# Patient Record
Sex: Female | Born: 1950 | Race: White | Hispanic: No | Smoking: Current every day smoker
Health system: Southern US, Community
[De-identification: ages and names within clinical notes are randomized; demographics above are authoritative.]

## PROBLEM LIST (undated history)

## (undated) DIAGNOSIS — R112 Nausea with vomiting, unspecified: Secondary | ICD-10-CM

## (undated) DIAGNOSIS — J449 Chronic obstructive pulmonary disease, unspecified: Secondary | ICD-10-CM

## (undated) DIAGNOSIS — Z9889 Other specified postprocedural states: Secondary | ICD-10-CM

## (undated) DIAGNOSIS — C50919 Malignant neoplasm of unspecified site of unspecified female breast: Secondary | ICD-10-CM

## (undated) DIAGNOSIS — Z9221 Personal history of antineoplastic chemotherapy: Secondary | ICD-10-CM

## (undated) DIAGNOSIS — Z1371 Encounter for nonprocreative screening for genetic disease carrier status: Secondary | ICD-10-CM

## (undated) DIAGNOSIS — C801 Malignant (primary) neoplasm, unspecified: Secondary | ICD-10-CM

## (undated) DIAGNOSIS — Z923 Personal history of irradiation: Secondary | ICD-10-CM

## (undated) DIAGNOSIS — R918 Other nonspecific abnormal finding of lung field: Secondary | ICD-10-CM

## (undated) DIAGNOSIS — I4510 Unspecified right bundle-branch block: Secondary | ICD-10-CM

## (undated) HISTORY — DX: Malignant (primary) neoplasm, unspecified: C80.1

## (undated) HISTORY — PX: BREAST CYST ASPIRATION: SHX578

## (undated) HISTORY — DX: Encounter for nonprocreative screening for genetic disease carrier status: Z13.71

---

## 1997-07-08 HISTORY — PX: BREAST EXCISIONAL BIOPSY: SUR124

## 2000-07-08 HISTORY — PX: CHOLECYSTECTOMY: SHX55

## 2003-07-09 DIAGNOSIS — C50919 Malignant neoplasm of unspecified site of unspecified female breast: Secondary | ICD-10-CM

## 2003-07-09 HISTORY — PX: BREAST LUMPECTOMY: SHX2

## 2003-07-09 HISTORY — DX: Malignant neoplasm of unspecified site of unspecified female breast: C50.919

## 2003-07-09 HISTORY — PX: BREAST EXCISIONAL BIOPSY: SUR124

## 2004-06-14 ENCOUNTER — Ambulatory Visit: Payer: Self-pay | Admitting: Radiation Oncology

## 2004-07-08 HISTORY — PX: MASTECTOMY: SHX3

## 2004-07-08 HISTORY — PX: BREAST EXCISIONAL BIOPSY: SUR124

## 2004-07-16 ENCOUNTER — Ambulatory Visit: Payer: Self-pay | Admitting: General Surgery

## 2004-07-25 ENCOUNTER — Ambulatory Visit: Payer: Self-pay | Admitting: General Surgery

## 2004-08-13 ENCOUNTER — Ambulatory Visit: Payer: Self-pay | Admitting: Oncology

## 2004-08-29 ENCOUNTER — Ambulatory Visit: Payer: Self-pay | Admitting: General Surgery

## 2004-09-05 ENCOUNTER — Ambulatory Visit: Payer: Self-pay | Admitting: Oncology

## 2004-10-06 ENCOUNTER — Ambulatory Visit: Payer: Self-pay | Admitting: Oncology

## 2004-10-31 ENCOUNTER — Emergency Department: Payer: Self-pay | Admitting: Emergency Medicine

## 2004-11-05 ENCOUNTER — Ambulatory Visit: Payer: Self-pay | Admitting: Oncology

## 2004-12-06 ENCOUNTER — Ambulatory Visit: Payer: Self-pay | Admitting: Oncology

## 2005-01-05 ENCOUNTER — Ambulatory Visit: Payer: Self-pay | Admitting: Oncology

## 2005-02-05 ENCOUNTER — Ambulatory Visit: Payer: Self-pay | Admitting: Oncology

## 2005-03-08 ENCOUNTER — Ambulatory Visit: Payer: Self-pay | Admitting: Oncology

## 2005-05-13 ENCOUNTER — Ambulatory Visit: Payer: Self-pay | Admitting: Oncology

## 2005-06-07 ENCOUNTER — Ambulatory Visit: Payer: Self-pay | Admitting: Oncology

## 2005-07-24 ENCOUNTER — Ambulatory Visit: Payer: Self-pay | Admitting: Radiation Oncology

## 2005-08-08 ENCOUNTER — Ambulatory Visit: Payer: Self-pay | Admitting: Radiation Oncology

## 2005-08-30 ENCOUNTER — Ambulatory Visit: Payer: Self-pay | Admitting: Oncology

## 2005-09-05 ENCOUNTER — Ambulatory Visit: Payer: Self-pay | Admitting: Radiation Oncology

## 2005-12-02 ENCOUNTER — Ambulatory Visit: Payer: Self-pay | Admitting: Oncology

## 2005-12-06 ENCOUNTER — Ambulatory Visit: Payer: Self-pay | Admitting: Oncology

## 2006-01-22 ENCOUNTER — Ambulatory Visit: Payer: Self-pay | Admitting: Oncology

## 2006-03-03 ENCOUNTER — Ambulatory Visit: Payer: Self-pay | Admitting: Oncology

## 2006-03-08 ENCOUNTER — Ambulatory Visit: Payer: Self-pay | Admitting: Oncology

## 2006-04-07 ENCOUNTER — Ambulatory Visit: Payer: Self-pay | Admitting: Oncology

## 2006-07-14 ENCOUNTER — Ambulatory Visit: Payer: Self-pay | Admitting: Oncology

## 2006-07-28 IMAGING — CT CT CHEST-ABD-PELV W/ CM
1 of 6 series · 13 of 32 positions shown, 18 images · non-contrast
Comparison: none

REASON FOR EXAM: breast CA, evaluate for mets
COMMENTS:

[Series 3: inspace · axial · 0.62mm/px · z∈[-533,-31]mm · 13 of 574 slices shown, 18 images]
[im 36/574  mediastinal]
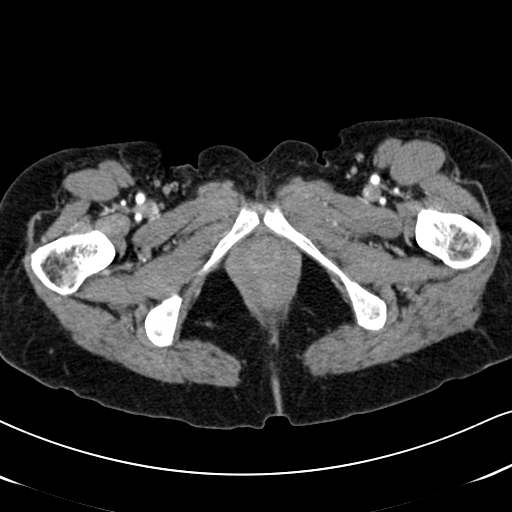
[im 36/574  bone]
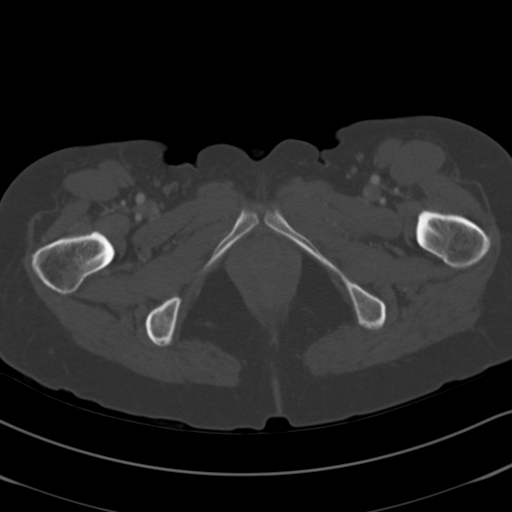
[im 108/574  mediastinal]
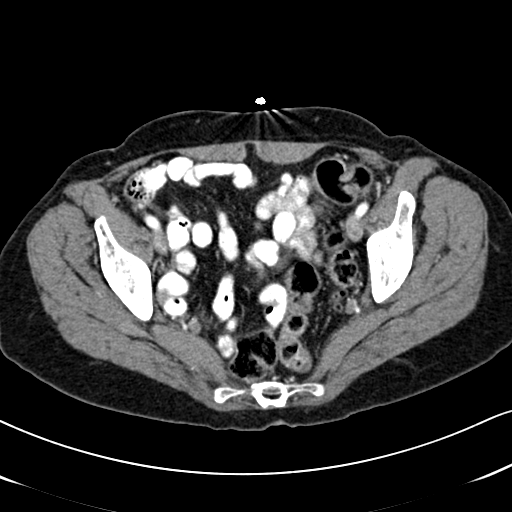
[im 144/574  mediastinal]
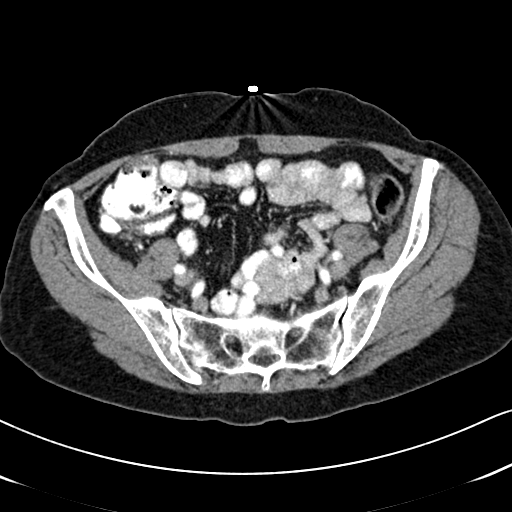
[im 180/574  mediastinal]
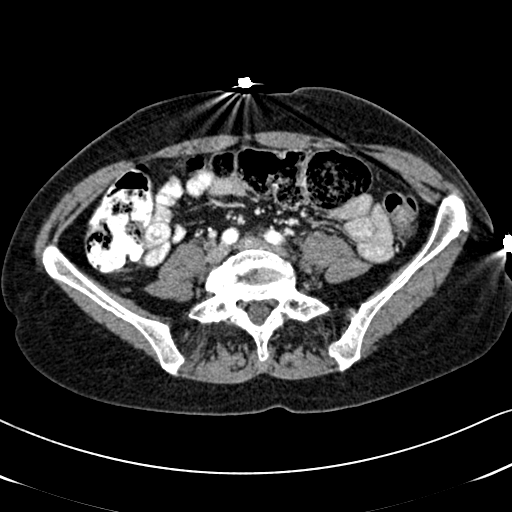
[im 251/574  mediastinal]
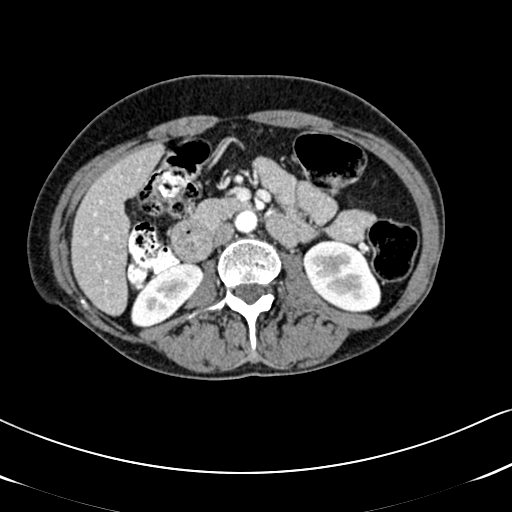
[im 278/574  mediastinal]
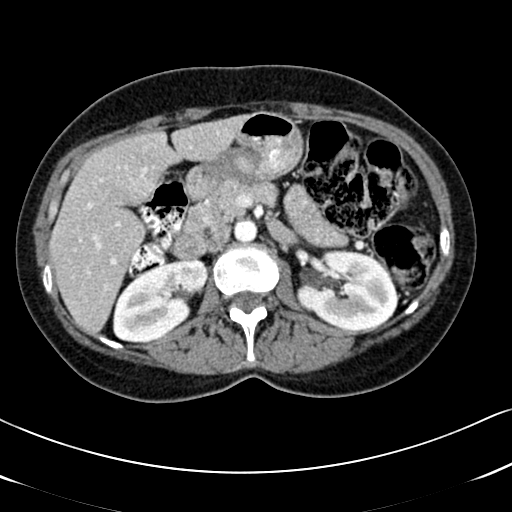
[im 287/574  mediastinal]
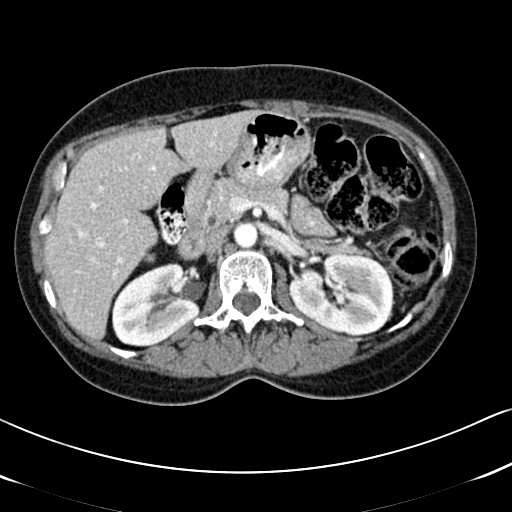
[im 359/574  mediastinal]
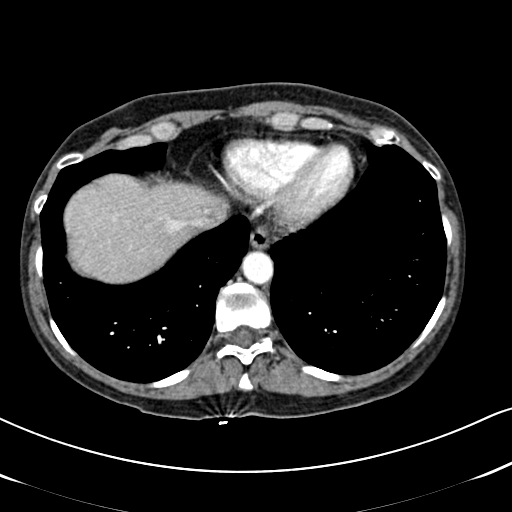
[im 394/574  mediastinal]
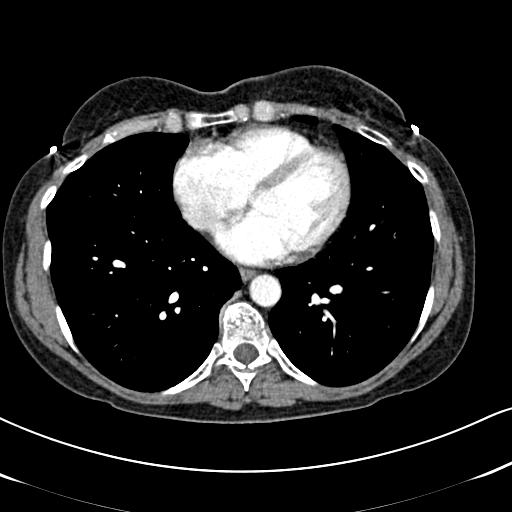
[im 394/574  bone]
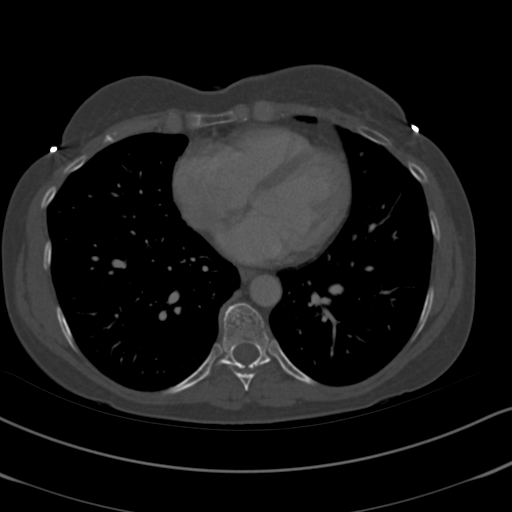
[im 430/574  mediastinal]
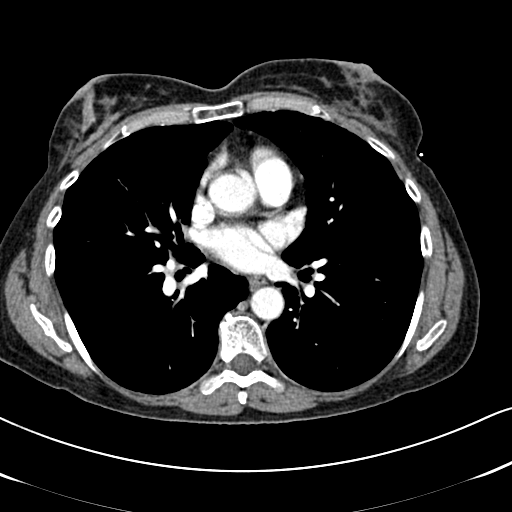
[im 430/574  lung]
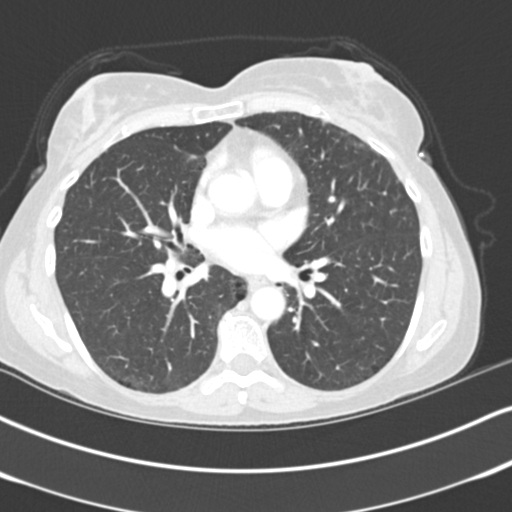
[im 466/574  lung]
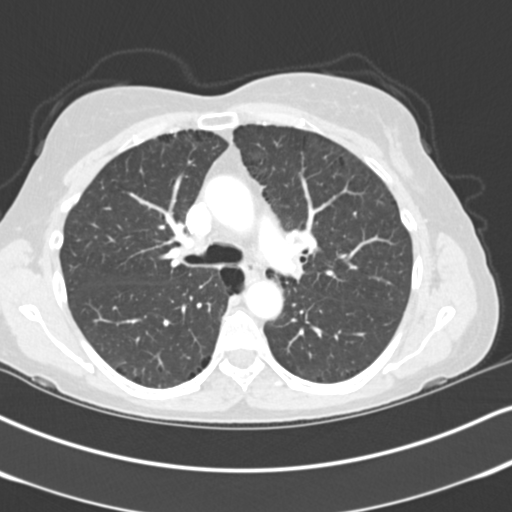
[im 502/574  mediastinal]
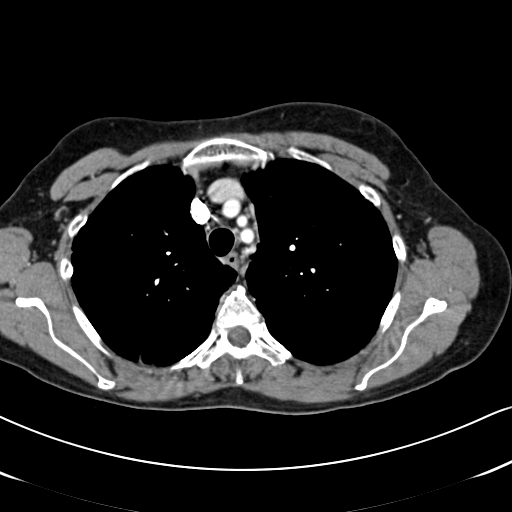
[im 502/574  lung]
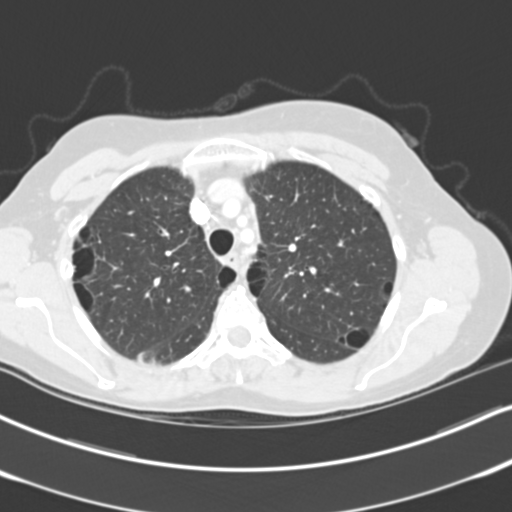
[im 538/574  mediastinal]
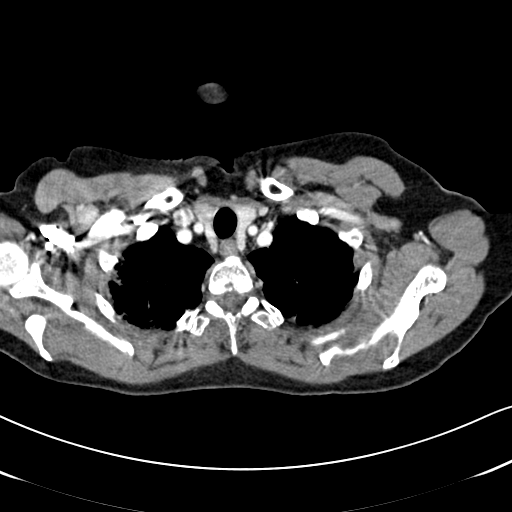
[im 538/574  lung]
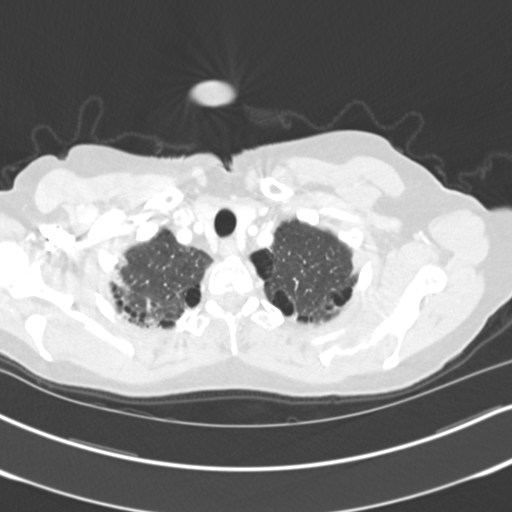

[13 of 32 positions shown; findings below may reference images not displayed]

PROCEDURE:     CT  - CT CHEST ABDOMEN AND PELVIS W  - August 14, 2004 [DATE]

RESULT:     IV contrast enhanced CT scan of the chest is performed.

CT SCAN OF CHEST:  There are observed areas of density at the lung apices
suggestive of fibrosis. There is associated small bulla in both upper lung
zone regions. No definite pulmonary parenchymal mass is appreciated. Minimal
areas of linear density suggest fibrosis or subsegmental atelectasis. There
is no effusion. There is no evidence of axillary, mediastinal or hilar mass
or adenopathy. The heart is not enlarged.
CONCLUSION: Apical bullous emphysematous changes with areas of fibrosis in
the apices. No definite pulmonary parenchymal metastatic disease or evidence
of mediastinal metastatic involvement. Note is made that there is some
slightly asymmetric soft tissue density on the RIGHT which may represent
post-operative change.

CT SCAN OF ABDOMEN AND PELVIS:  IV and oral contrast enhanced CT scan of the
abdomen and pelvis is performed.

The liver, spleen, pancreas and adrenal glands appear to be unremarkable.
There is a small, low density area in the RIGHT kidney suggestive of a tiny
cyst. This does not enhance on the delayed images. This is in the medial
posterior aspect of the lower portion of the RIGHT kidney and measures less
than 1.0 cm in size. The abdominal aorta is normal in caliber.
Cholecystectomy clips are present. No definite retroperitoneal or mesenteric
adenopathy is seen. The bowel is intermittently opacified. No free fluid or
abnormal fluid collections are seen. The uterus is present. No inguinal
adenopathy is appreciated.
IMPRESSION: 1.     Incidental finding of a RIGHT renal cyst.
2.     Status post cholecystectomy.
3.     No definite evidence of metastatic disease. Correlation with Bone
Scan may be helpful to evaluate for occult bony metastatic disease.

## 2006-08-08 ENCOUNTER — Ambulatory Visit: Payer: Self-pay | Admitting: Oncology

## 2006-08-11 ENCOUNTER — Ambulatory Visit: Payer: Self-pay | Admitting: General Surgery

## 2006-11-06 ENCOUNTER — Ambulatory Visit: Payer: Self-pay | Admitting: Oncology

## 2006-11-10 ENCOUNTER — Ambulatory Visit: Payer: Self-pay | Admitting: Oncology

## 2006-12-07 ENCOUNTER — Ambulatory Visit: Payer: Self-pay | Admitting: Oncology

## 2007-03-16 ENCOUNTER — Ambulatory Visit: Payer: Self-pay | Admitting: Oncology

## 2007-04-08 ENCOUNTER — Ambulatory Visit: Payer: Self-pay | Admitting: Oncology

## 2007-07-09 HISTORY — PX: COLONOSCOPY: SHX174

## 2007-08-09 ENCOUNTER — Ambulatory Visit: Payer: Self-pay | Admitting: Oncology

## 2007-08-10 ENCOUNTER — Ambulatory Visit: Payer: Self-pay | Admitting: General Surgery

## 2007-09-06 ENCOUNTER — Ambulatory Visit: Payer: Self-pay | Admitting: Oncology

## 2007-10-08 ENCOUNTER — Ambulatory Visit: Payer: Self-pay | Admitting: Oncology

## 2007-11-06 ENCOUNTER — Ambulatory Visit: Payer: Self-pay | Admitting: Oncology

## 2008-08-10 ENCOUNTER — Ambulatory Visit: Payer: Self-pay | Admitting: General Surgery

## 2009-10-10 ENCOUNTER — Ambulatory Visit: Payer: Self-pay

## 2010-10-16 ENCOUNTER — Ambulatory Visit: Payer: Self-pay

## 2011-07-09 DIAGNOSIS — Z1371 Encounter for nonprocreative screening for genetic disease carrier status: Secondary | ICD-10-CM

## 2011-07-09 HISTORY — DX: Encounter for nonprocreative screening for genetic disease carrier status: Z13.71

## 2011-10-16 ENCOUNTER — Ambulatory Visit: Payer: Self-pay

## 2012-08-28 ENCOUNTER — Encounter: Payer: Self-pay | Admitting: *Deleted

## 2012-08-28 DIAGNOSIS — C801 Malignant (primary) neoplasm, unspecified: Secondary | ICD-10-CM | POA: Insufficient documentation

## 2012-10-21 ENCOUNTER — Ambulatory Visit: Payer: Self-pay

## 2012-11-18 ENCOUNTER — Ambulatory Visit: Payer: Self-pay | Admitting: General Surgery

## 2012-12-03 ENCOUNTER — Ambulatory Visit: Payer: Self-pay | Admitting: General Surgery

## 2012-12-07 ENCOUNTER — Encounter: Payer: Self-pay | Admitting: General Surgery

## 2012-12-22 ENCOUNTER — Encounter: Payer: Self-pay | Admitting: General Surgery

## 2012-12-22 ENCOUNTER — Ambulatory Visit (INDEPENDENT_AMBULATORY_CARE_PROVIDER_SITE_OTHER): Payer: PRIVATE HEALTH INSURANCE | Admitting: General Surgery

## 2012-12-22 VITALS — BP 110/62 | HR 72 | Resp 12 | Ht 61.0 in | Wt 94.0 lb

## 2012-12-22 DIAGNOSIS — Z853 Personal history of malignant neoplasm of breast: Secondary | ICD-10-CM

## 2012-12-22 DIAGNOSIS — C801 Malignant (primary) neoplasm, unspecified: Secondary | ICD-10-CM

## 2012-12-22 NOTE — Patient Instructions (Addendum)
Patient to return in one year. 

## 2012-12-22 NOTE — Progress Notes (Signed)
Patient ID: Gabriella Green, female   DOB: 07/04/1951, 62 y.o.   MRN: [id number]  Chief Complaint  Patient presents with  . Follow-up    mammogram    HPI Gabriella Green is a 62 y.o. female hx of bil breast ca, s/p right mastectomy and left breast lumpectomy. Overall doing well. Her younger sister was diagnosed with breast cancer and a mother .Pt has not been tested for BRCA. She does have daughters and a granddaughter. Patient had her mammogram on 12/03/12 cat 2.    HPI  Past Medical History  Diagnosis Date  . Cancer 1478,2956    right mastectomy and left breast lumpectomy    Past Surgical History  Procedure Laterality Date  . Mastectomy Right 2006  . Breast lumpectomy Left 2005    1999 right lumpectomy  . Cholecystectomy  2002  . Colonoscopy  2009    Family History  Problem Relation Age of Onset  . Breast cancer Mother   . Breast cancer Sister     Social History History  Substance Use Topics  . Smoking status: Current Every Day Smoker -- 1.00 packs/day for 20 years    Types: Cigarettes  . Smokeless tobacco: Not on file  . Alcohol Use: No    Allergies  Allergen Reactions  . Codeine Nausea And Vomiting  . Tape Swelling    tegaderm     No current outpatient prescriptions on file.   No current facility-administered medications for this visit.    Review of Systems Review of Systems  Constitutional: Negative.   Respiratory: Negative.   Cardiovascular: Negative.     Blood pressure 110/62, pulse 72, resp. rate 12, height 5\' 1"  (1.549 m), weight 94 lb (42.638 kg).  Physical Exam Physical Exam  Constitutional: She appears well-developed and well-nourished.  Eyes: Conjunctivae are normal. No scleral icterus.  Neck: Neck supple. No thyromegaly present.  Cardiovascular: Normal rate, regular rhythm and normal heart sounds.   Pulmonary/Chest: Breath sounds normal.  Right mastectomy well healed. No sign of local recurrence. Left lumpectomy site well healed.   Lymphadenopathy:    She has no cervical adenopathy.    She has no axillary adenopathy.  Neurological: She is alert.  Skin: Skin is warm and dry.    Data Reviewed mammogram reviewed  Assessment    Stable exam    Plan    Return in one year.        SANKAR,SEEPLAPUTHUR G 12/22/2012, 6:55 PM

## 2014-02-02 ENCOUNTER — Ambulatory Visit: Payer: Self-pay

## 2014-03-07 ENCOUNTER — Ambulatory Visit (INDEPENDENT_AMBULATORY_CARE_PROVIDER_SITE_OTHER): Payer: PRIVATE HEALTH INSURANCE | Admitting: General Surgery

## 2014-03-07 ENCOUNTER — Encounter: Payer: Self-pay | Admitting: General Surgery

## 2014-03-07 VITALS — BP 124/72 | HR 70 | Resp 12 | Ht 62.0 in | Wt 99.0 lb

## 2014-03-07 DIAGNOSIS — Z853 Personal history of malignant neoplasm of breast: Secondary | ICD-10-CM

## 2014-03-07 NOTE — Progress Notes (Signed)
Patient ID: Gabriella Green, female   DOB: 10/31/1950, 63 y.o.   MRN: [id number]  Chief Complaint  Patient presents with  . Follow-up    mammogram    HPI Gabriella Green is a 63 y.o. female who presents for a breast cancer follow up  The most recent left breast  mammogram was done on  01/13/14. Patient had her generic testing done 2013-negative Patient does perform regular self breast checks and gets regular mammograms done.    HPI  Past Medical History  Diagnosis Date  . Cancer 7425,9563    right mastectomy and left breast lumpectomy    Past Surgical History  Procedure Laterality Date  . Mastectomy Right 2006  . Breast lumpectomy Left 2005    1999 right lumpectomy  . Cholecystectomy  2002  . Colonoscopy  2009    Family History  Problem Relation Age of Onset  . Breast cancer Mother   . Breast cancer Sister     Social History History  Substance Use Topics  . Smoking status: Current Every Day Smoker -- 1.00 packs/day for 20 years    Types: Cigarettes  . Smokeless tobacco: Not on file  . Alcohol Use: No    Allergies  Allergen Reactions  . Codeine Nausea And Vomiting  . Tape Swelling    tegaderm     No current outpatient prescriptions on file.   No current facility-administered medications for this visit.    Review of Systems Review of Systems  Blood pressure 124/72, pulse 70, resp. rate 12, height 5\' 2"  (1.575 m), weight 99 lb (44.906 kg).  Physical Exam Physical Exam  Constitutional: She is oriented to person, place, and time. She appears well-developed and well-nourished.  Eyes: Conjunctivae are normal. No scleral icterus.  Neck: Neck supple. No mass and no thyromegaly present.  Cardiovascular: Normal rate, regular rhythm and normal heart sounds.   Pulses:      Dorsalis pedis pulses are 2+ on the right side, and 2+ on the left side.       Posterior tibial pulses are 2+ on the right side, and 2+ on the left side.  No edema  Pulmonary/Chest: Breath sounds  normal. Left breast exhibits no inverted nipple, no mass, no nipple discharge, no skin change and no tenderness.  Right mastectomy site is well healed no sign of local recurrent.   Abdominal: Soft. Bowel sounds are normal. There is no tenderness.  Lymphadenopathy:    She has no cervical adenopathy.    She has no axillary adenopathy.  Neurological: She is alert and oriented to person, place, and time.  Skin: Skin is warm and dry.    Data Reviewed Mammogram left reviewed and stable  Assessment    Stable exam. History of bilateral Breast Ca, s/p right mastectomy, left lumpectomy and radiation.      Plan    Patient will be asked to return to the office in one year with a left breast  mammogram.        Junie Panning G 03/07/2014, 12:55 PM

## 2014-03-07 NOTE — Patient Instructions (Addendum)
Patient will be asked to return to the office in one year for a left breast diagnotic mammogram. Continue self breast exams. Call office for any new breast issues or concerns.

## 2014-05-09 ENCOUNTER — Encounter: Payer: Self-pay | Admitting: General Surgery

## 2014-07-31 ENCOUNTER — Emergency Department: Payer: Self-pay | Admitting: Emergency Medicine

## 2015-01-19 ENCOUNTER — Encounter: Payer: Self-pay | Admitting: *Deleted

## 2015-02-08 ENCOUNTER — Encounter: Payer: Self-pay | Admitting: *Deleted

## 2015-02-08 ENCOUNTER — Ambulatory Visit: Payer: Self-pay | Attending: Oncology | Admitting: *Deleted

## 2015-02-08 ENCOUNTER — Ambulatory Visit
Admission: RE | Admit: 2015-02-08 | Discharge: 2015-02-08 | Disposition: A | Discharge status: Home / Self Care | Payer: Self-pay | Source: Ambulatory Visit | Attending: Oncology | Admitting: Oncology

## 2015-02-08 VITALS — BP 143/73 | HR 79 | Temp 97.9°F | Ht 61.81 in | Wt 95.9 lb

## 2015-02-08 DIAGNOSIS — N63 Unspecified lump in unspecified breast: Secondary | ICD-10-CM

## 2015-02-08 NOTE — Progress Notes (Signed)
Subjective:     Patient ID: Gabriella Green, female   DOB: Nov 29, 1950, 64 y.o.   MRN: [id number]  HPI   Review of Systems     Objective:   Physical Exam  Pulmonary/Chest: Left breast exhibits mass and skin change. Left breast exhibits no inverted nipple, no nipple discharge and no tenderness. Breasts are asymmetrical.         Assessment:    64 year old White female returns to [facility] for annual screening.  Patient with 3 time history of breast cancer.  Twice in the right breast with mastectomy and once in the left breast.  Last diagnosis was in 2006.  On clinical breast exam the patient pointed out an area of concern in the left axillary tail.  I can palpate a smooth, mobile, superficial approximate 4 mm nodule at 1:00 4 cm from the areola.  Taught self breast awareness.  Patient has been screened for eligibility.  She does not have any insurance, Medicare or Medicaid.  She also meets financial eligibility.  Hand-out given on the Affordable Care Act.    Plan:     Will get uni left mammogram and ultrasound.  Patient is already scheduled to see Dr. Jamal Collin on 02/15/15.  Encouraged her to keep the appointment even if the mammogram is normal.  She is agreeable.  Will follow-up per protocol.

## 2015-02-15 ENCOUNTER — Encounter: Payer: Self-pay | Admitting: General Surgery

## 2015-02-15 ENCOUNTER — Ambulatory Visit (INDEPENDENT_AMBULATORY_CARE_PROVIDER_SITE_OTHER): Payer: PRIVATE HEALTH INSURANCE | Admitting: General Surgery

## 2015-02-15 VITALS — BP 122/66 | HR 82 | Resp 12 | Ht 61.0 in | Wt 96.0 lb

## 2015-02-15 DIAGNOSIS — Z853 Personal history of malignant neoplasm of breast: Secondary | ICD-10-CM | POA: Diagnosis not present

## 2015-02-15 NOTE — Progress Notes (Signed)
Patient ID: Gabriella Green, female   DOB: 07/29/1950, 64 y.o.   MRN: [id number]  Chief Complaint  Patient presents with  . Follow-up    mammogram    HPI Gabriella Green is a 64 y.o. female.  who presents for a breast cancer follow up. The most recent left breastmammogram and ultrasound was done on8-3-16. Patient had her generic testing done 2013-negative. Patient does not perform regular self breast checks and gets regular mammograms done. She can feel a lump in the left breast that she has noticied for about 3 weeks. Denies any injury or trauma.  HPI  Past Medical History  Diagnosis Date  . Cancer 3151,7616    right mastectomy and left breast lumpectomy  . BRCA negative 2013    Past Surgical History  Procedure Laterality Date  . Mastectomy Right 2006  . Breast lumpectomy Left 2005    1999 right lumpectomy  . Cholecystectomy  2002  . Colonoscopy  2009  . Breast excisional biopsy Right 1999    rad breast ca  . Breast excisional biopsy Left 2005    rad breast ca  . Breast excisional biopsy Right 2006    mastectomy chemo   . Breast cyst aspiration Bilateral     Family History  Problem Relation Age of Onset  . Breast cancer Mother 4  . Breast cancer Sister 31  . Breast cancer Maternal Aunt 61    Social History Social History  Substance Use Topics  . Smoking status: Current Every Day Smoker -- 1.00 packs/day for 20 years    Types: Cigarettes  . Smokeless tobacco: None  . Alcohol Use: No    Allergies  Allergen Reactions  . Codeine Nausea And Vomiting  . Tape Swelling    tegaderm     No current outpatient prescriptions on file.   No current facility-administered medications for this visit.    Review of Systems Review of Systems  Constitutional: Negative.   Respiratory: Negative.   Cardiovascular: Negative.     Blood pressure 122/66, pulse 82, resp. rate 12, height '5\' 1"'  (1.549 m), weight 96 lb (43.545 kg).  Physical Exam Physical Exam   Constitutional: She is oriented to person, place, and time. She appears well-developed and well-nourished.  HENT:  Mouth/Throat: Oropharynx is clear and moist.  Eyes: Conjunctivae are normal. No scleral icterus.  Neck: Neck supple.  Cardiovascular: Normal rate, regular rhythm and normal heart sounds.   Pulmonary/Chest: Effort normal and breath sounds normal. Left breast exhibits no inverted nipple, no mass, no nipple discharge, no skin change and no tenderness.  Right mastectomy well healed, no sign of local reoccurrence.   Abdominal: Soft. There is no hepatomegaly. There is no tenderness.  Lymphadenopathy:    She has no cervical adenopathy.  Neurological: She is alert and oriented to person, place, and time.  Skin: Skin is warm and dry.  Psychiatric: Her behavior is normal.    Data Reviewed Mammogram and ultrasound reviewed. Normal  Assessment    Stable physical exam.    Plan    The patient has been asked to return to the office in one year with a unilateral left breast diagnostic mammogram.      PCP:  No Pcp Per Patient  Christene Lye 02/15/2015, 3:46 PM

## 2015-02-15 NOTE — Patient Instructions (Addendum)
The patient is aware to call back for any questions or concerns.  Continue self breast exams. Call office for any new breast issues or concerns. The patient has been asked to return to the office in one year with a unilateral left breast diagnostic mammogram.

## 2015-02-17 ENCOUNTER — Encounter: Payer: Self-pay | Admitting: *Deleted

## 2015-02-17 NOTE — Progress Notes (Signed)
Patient with benign findings confirmed by Dr. Jamal Collin.  To follow-up in one year.  HSIS to [place].

## 2015-12-06 ENCOUNTER — Other Ambulatory Visit: Payer: Self-pay

## 2015-12-06 DIAGNOSIS — Z1231 Encounter for screening mammogram for malignant neoplasm of breast: Secondary | ICD-10-CM

## 2016-02-12 ENCOUNTER — Other Ambulatory Visit: Payer: Self-pay | Admitting: General Surgery

## 2016-02-12 ENCOUNTER — Ambulatory Visit
Admission: RE | Admit: 2016-02-12 | Discharge: 2016-02-12 | Disposition: A | Discharge status: Home / Self Care | Payer: Medicare Other | Source: Ambulatory Visit | Attending: General Surgery | Admitting: General Surgery

## 2016-02-12 DIAGNOSIS — Z1231 Encounter for screening mammogram for malignant neoplasm of breast: Secondary | ICD-10-CM | POA: Diagnosis not present

## 2016-02-12 HISTORY — DX: Malignant neoplasm of unspecified site of unspecified female breast: C50.919

## 2016-02-15 ENCOUNTER — Encounter: Payer: Self-pay | Admitting: *Deleted

## 2016-02-21 ENCOUNTER — Ambulatory Visit (INDEPENDENT_AMBULATORY_CARE_PROVIDER_SITE_OTHER): Payer: Medicare Other | Admitting: General Surgery

## 2016-02-21 ENCOUNTER — Encounter: Payer: Self-pay | Admitting: General Surgery

## 2016-02-21 VITALS — BP 140/78 | HR 72 | Resp 12 | Ht 61.0 in | Wt 92.0 lb

## 2016-02-21 DIAGNOSIS — Z853 Personal history of malignant neoplasm of breast: Secondary | ICD-10-CM

## 2016-02-21 NOTE — Progress Notes (Signed)
Patient ID: Gabriella Green, female   DOB: [birth year], 65 y.o.   MRN: [id number]  Chief Complaint  Patient presents with  . Follow-up    Mammogram    HPI Gabriella Green is a 65 y.o. female who presents for a follow up on breast cancer x 2, s/p right mastectomy and left lumpectomy + radiation. The most recent mammogram was done on 02/12/16. Patient states that she has two knots - one under the left axilla, the other in the RLQ.  Patient does perform regular self breast checks and gets regular mammograms done.   I have reviewed the history of present illness with the patient.  HPI  Past Medical History:  Diagnosis Date  . BRCA negative 2013  . Breast cancer ([place]) 2005   left breast ca  . Breast cancer ([place]) 2006, 1999   right breast ca  . Cancer [facility]) [id number]   right mastectomy and left breast lumpectomy    Past Surgical History:  Procedure Laterality Date  . BREAST CYST ASPIRATION Bilateral   . BREAST EXCISIONAL BIOPSY Right 1999   rad breast ca  . BREAST EXCISIONAL BIOPSY Left 2005   rad breast ca  . BREAST EXCISIONAL BIOPSY Right 2006   mastectomy chemo   . BREAST LUMPECTOMY Left 2005   1999 right lumpectomy  . CHOLECYSTECTOMY  2002  . COLONOSCOPY  2009  . MASTECTOMY Right 2006    Family History  Problem Relation Age of Onset  . Breast cancer Mother 16  . Breast cancer Sister 60  . Breast cancer Maternal Aunt 58    Social History Social History  Substance Use Topics  . Smoking status: Current Every Day Smoker    Packs/day: 1.00    Years: 20.00    Types: Cigarettes  . Smokeless tobacco: Not on file  . Alcohol use No    Allergies  Allergen Reactions  . Codeine Nausea And Vomiting  . Tape Swelling    tegaderm     No current outpatient prescriptions on file.   No current facility-administered medications for this visit.     Review of Systems Review of Systems  Constitutional: Negative.   Respiratory: Negative.   Cardiovascular: Negative.      Blood pressure 140/78, pulse 72, resp. rate 12, height '5\' 1"'  (1.549 m), weight 92 lb (41.7 kg).  Physical Exam Physical Exam  Constitutional: She is oriented to person, place, and time. She appears well-developed and well-nourished.  HENT:  Mouth/Throat: Oropharynx is clear and moist.  Eyes: Conjunctivae are normal. No scleral icterus.  Neck: Neck supple.  Cardiovascular: Normal rate, regular rhythm and normal heart sounds.   Pulmonary/Chest: Effort normal and breath sounds normal. Left breast exhibits no inverted nipple, no mass, no nipple discharge, no skin change and no tenderness.    Abdominal: Soft. Normal appearance. No hernia.    Lymphadenopathy:    She has no cervical adenopathy.    She has no axillary adenopathy.  Neurological: She is alert and oriented to person, place, and time.  Skin: Skin is warm and dry.  Psychiatric: Her behavior is normal.    Data Reviewed Previous notes, Mammogram reviewed  - stable  Assessment    RLQ mass - possible lipoma. 0.5cm smooth, non-tender, mobile subcutaneous.  Personal and family history of breast cancer  Plan    Follow up two months to check on RLQ mass.     The patient has been asked to return to the office in one year with  a unilateral left breast diagnostic mammogram.      This information has been scribed by Gaspar Cola CMA.     Kavian Peters G 02/22/2016, 11:51 AM

## 2016-02-21 NOTE — Patient Instructions (Addendum)
The patient has been asked to return to the office in one year with a unilateral left breast diagnostic mammogram.     Follow up two months for RLQ mass

## 2016-04-23 ENCOUNTER — Encounter: Payer: Self-pay | Admitting: General Surgery

## 2016-04-23 ENCOUNTER — Ambulatory Visit (INDEPENDENT_AMBULATORY_CARE_PROVIDER_SITE_OTHER): Payer: Medicare Other | Admitting: General Surgery

## 2016-04-23 VITALS — BP 122/64 | HR 66 | Resp 12 | Ht 60.0 in | Wt 93.0 lb

## 2016-04-23 DIAGNOSIS — R222 Localized swelling, mass and lump, trunk: Secondary | ICD-10-CM

## 2016-04-23 DIAGNOSIS — Z853 Personal history of malignant neoplasm of breast: Secondary | ICD-10-CM | POA: Diagnosis not present

## 2016-04-23 NOTE — Progress Notes (Signed)
Patient ID: Gabriella Green, female   DOB: 01-Aug-1950, 65 y.o.   MRN: [id number]  Chief Complaint  Patient presents with  . Follow-up    abdominal wall mass    HPI Gabriella Green is a 65 y.o. female is here today for a 2 month abdominal wall mass follow up. She states the mass has stayed the same, doing well. No pain with the lump. I have reviewed the history of present illness with the patient.  HPI  Past Medical History:  Diagnosis Date  . BRCA negative 2013  . Breast cancer ([place]) 2005   left breast ca  . Breast cancer ([place]) 2006, 1999   right breast ca  . Cancer [facility]) [id number]   right mastectomy and left breast lumpectomy    Past Surgical History:  Procedure Laterality Date  . BREAST CYST ASPIRATION Bilateral   . BREAST EXCISIONAL BIOPSY Right 1999   rad breast ca  . BREAST EXCISIONAL BIOPSY Left 2005   rad breast ca  . BREAST EXCISIONAL BIOPSY Right 2006   mastectomy chemo   . BREAST LUMPECTOMY Left 2005   1999 right lumpectomy  . CHOLECYSTECTOMY  2002  . COLONOSCOPY  2009  . MASTECTOMY Right 2006    Family History  Problem Relation Age of Onset  . Breast cancer Mother 10  . Breast cancer Sister 49  . Breast cancer Maternal Aunt 10    Social History Social History  Substance Use Topics  . Smoking status: Current Every Day Smoker    Packs/day: 1.00    Years: 20.00    Types: Cigarettes  . Smokeless tobacco: Not on file  . Alcohol use No    Allergies  Allergen Reactions  . Codeine Nausea And Vomiting  . Tape Swelling    tegaderm     No current outpatient prescriptions on file.   No current facility-administered medications for this visit.     Review of Systems Review of Systems  Constitutional: Negative.   Respiratory: Negative.   Cardiovascular: Negative.     Blood pressure 122/64, pulse 66, resp. rate 12, height 5' (1.524 m), weight 93 lb (42.2 kg).  Physical Exam Physical Exam  Constitutional: She is oriented to person, place, and  time. She appears well-developed and well-nourished.  Eyes: Conjunctivae are normal. No scleral icterus.  Cardiovascular: Normal rate, regular rhythm and normal heart sounds.   Abdominal: Soft. Bowel sounds are normal. There is no tenderness.    Right  Neurological: She is alert and oriented to person, place, and time.  Skin: Skin is warm and dry.    Data Reviewed Prior note  Assessment    Likely a benign 9m subcutaneous mass abdominal wall- possibly a lipoma.    Plan    No need for intervention. Advised pt to call if it enlarges or causes any pain   Follow up as scheduled next yr Patient aware to call the office if the mass increases in size.     SANKAR,SEEPLAPUTHUR G 04/23/2016, 1:46 PM

## 2016-04-23 NOTE — Patient Instructions (Signed)
Patient aware to call the office if the mass increases in size.

## 2016-12-18 ENCOUNTER — Other Ambulatory Visit: Payer: Self-pay

## 2016-12-18 DIAGNOSIS — Z1231 Encounter for screening mammogram for malignant neoplasm of breast: Secondary | ICD-10-CM

## 2016-12-18 DIAGNOSIS — Z853 Personal history of malignant neoplasm of breast: Secondary | ICD-10-CM

## 2016-12-23 ENCOUNTER — Other Ambulatory Visit: Payer: Self-pay

## 2016-12-23 DIAGNOSIS — Z1231 Encounter for screening mammogram for malignant neoplasm of breast: Secondary | ICD-10-CM

## 2017-02-12 ENCOUNTER — Ambulatory Visit
Admission: RE | Admit: 2017-02-12 | Discharge: 2017-02-12 | Disposition: A | Discharge status: Home / Self Care | Payer: Medicare Other | Source: Ambulatory Visit | Attending: General Surgery | Admitting: General Surgery

## 2017-02-12 DIAGNOSIS — Z1231 Encounter for screening mammogram for malignant neoplasm of breast: Secondary | ICD-10-CM | POA: Insufficient documentation

## 2017-02-19 ENCOUNTER — Ambulatory Visit: Payer: Medicare Other | Admitting: General Surgery

## 2017-02-20 ENCOUNTER — Encounter: Payer: Self-pay | Admitting: General Surgery

## 2017-02-20 ENCOUNTER — Ambulatory Visit (INDEPENDENT_AMBULATORY_CARE_PROVIDER_SITE_OTHER): Payer: Medicare Other | Admitting: General Surgery

## 2017-02-20 VITALS — BP 130/72 | HR 68 | Resp 12 | Ht 61.0 in | Wt 98.0 lb

## 2017-02-20 DIAGNOSIS — Z853 Personal history of malignant neoplasm of breast: Secondary | ICD-10-CM

## 2017-02-20 NOTE — Patient Instructions (Signed)
Follow up in one year with left screening mammogram and office visit with Dr Bary Castilla  Continue self breast exams. Call office for any new breast issues or concerns.

## 2017-02-20 NOTE — Progress Notes (Signed)
Patient ID: Gabriella Green, female   DOB: [birth year], 66 y.o.   MRN: [id number]  Chief Complaint  Patient presents with  . Follow-up    mammogram    HPI Gabriella Green is a 66 y.o. female with a history of bilateral breast cancer who presents for a breast evaluation. The most recent mammogram was done on 02/12/17. Patient does perform regular self breast checks and gets regular mammograms done.   She had some recent trouble with her stomach with nausea and weight loss. This was treated and seems to have resolved.    HPI  Past Medical History:  Diagnosis Date  . BRCA negative 2013  . Breast cancer ([place]) 2005   left breast ca  . Breast cancer ([place]) 2006, 1999   right breast ca  . Cancer [facility]) [id number]   right mastectomy and left breast lumpectomy    Past Surgical History:  Procedure Laterality Date  . BREAST CYST ASPIRATION Bilateral   . BREAST EXCISIONAL BIOPSY Right 1999   rad breast ca  . BREAST EXCISIONAL BIOPSY Left 2005   rad breast ca  . BREAST EXCISIONAL BIOPSY Right 2006   mastectomy chemo   . BREAST LUMPECTOMY Left 2005   1999 right lumpectomy  . CHOLECYSTECTOMY  2002  . COLONOSCOPY  2009  . MASTECTOMY Right 2006    Family History  Problem Relation Age of Onset  . Breast cancer Mother 47  . Breast cancer Sister 72  . Breast cancer Maternal Aunt 64    Social History Social History  Substance Use Topics  . Smoking status: Current Every Day Smoker    Packs/day: 1.00    Years: 20.00    Types: Cigarettes  . Smokeless tobacco: Never Used  . Alcohol use No    Allergies  Allergen Reactions  . Codeine Nausea And Vomiting  . Tape Swelling    tegaderm     No current outpatient prescriptions on file.   No current facility-administered medications for this visit.     Review of Systems Review of Systems  Constitutional: Negative.   Respiratory: Negative.   Cardiovascular: Negative.     Blood pressure 130/72, pulse 68, resp. rate 12, height '5\' 1"'   (1.549 m), weight 98 lb (44.5 kg).  Physical Exam Physical Exam  Constitutional: She is oriented to person, place, and time. She appears well-developed and well-nourished.  Eyes: Conjunctivae are normal. No scleral icterus.  Neck: Neck supple.  Cardiovascular: Normal rate, regular rhythm and normal heart sounds.   Pulmonary/Chest: Effort normal and breath sounds normal. Left breast exhibits no inverted nipple, no mass, no nipple discharge, no skin change and no tenderness.  Right mastectomy site no sign of local recurrence   Abdominal: Soft. Bowel sounds are normal. She exhibits no distension. There is no hepatomegaly. There is no tenderness. No hernia.  Lymphadenopathy:    She has no cervical adenopathy.    She has no axillary adenopathy.  Neurological: She is alert and oriented to person, place, and time.  Skin: Skin is warm and dry.  Psychiatric: She has a normal mood and affect.    Data Reviewed  Mammogram-stable.  Assessment    History off bilateral breast cancer. Status post right mastectomy and left breast lumpectomy , she is now 13 years out from last treatment. Doing very well    Plan    Follow up in one year with left screening mammogram and office visit with Dr Bary Castilla     HPI, Physical Exam,  Assessment and Plan have been scribed under the direction and in the presence of Mckinley Jewel, MD  Concepcion Living, LPN  I have completed the exam and reviewed the above documentation for accuracy and completeness.  I agree with the above.  Haematologist has been used and any errors in dictation or transcription are unintentional.  Seeplaputhur G. Jamal Collin, M.D., F.A.C.S.   Junie Panning G 02/24/2017, 8:39 AM

## 2017-03-24 ENCOUNTER — Encounter: Payer: Self-pay | Admitting: Emergency Medicine

## 2017-03-24 ENCOUNTER — Emergency Department
Admission: EM | Admit: 2017-03-24 | Discharge: 2017-03-24 | Disposition: A | Discharge status: Home / Self Care | Payer: Medicare Other | Attending: Emergency Medicine | Admitting: Emergency Medicine

## 2017-03-24 ENCOUNTER — Emergency Department: Payer: Medicare Other

## 2017-03-24 DIAGNOSIS — Y939 Activity, unspecified: Secondary | ICD-10-CM | POA: Diagnosis not present

## 2017-03-24 DIAGNOSIS — Y999 Unspecified external cause status: Secondary | ICD-10-CM | POA: Insufficient documentation

## 2017-03-24 DIAGNOSIS — Z853 Personal history of malignant neoplasm of breast: Secondary | ICD-10-CM | POA: Diagnosis not present

## 2017-03-24 DIAGNOSIS — S2232XA Fracture of one rib, left side, initial encounter for closed fracture: Secondary | ICD-10-CM | POA: Diagnosis not present

## 2017-03-24 DIAGNOSIS — F17210 Nicotine dependence, cigarettes, uncomplicated: Secondary | ICD-10-CM | POA: Insufficient documentation

## 2017-03-24 DIAGNOSIS — Y92009 Unspecified place in unspecified non-institutional (private) residence as the place of occurrence of the external cause: Secondary | ICD-10-CM

## 2017-03-24 DIAGNOSIS — Y92019 Unspecified place in single-family (private) house as the place of occurrence of the external cause: Secondary | ICD-10-CM | POA: Diagnosis not present

## 2017-03-24 DIAGNOSIS — W010XXA Fall on same level from slipping, tripping and stumbling without subsequent striking against object, initial encounter: Secondary | ICD-10-CM | POA: Insufficient documentation

## 2017-03-24 DIAGNOSIS — R0781 Pleurodynia: Secondary | ICD-10-CM

## 2017-03-24 DIAGNOSIS — W19XXXA Unspecified fall, initial encounter: Secondary | ICD-10-CM

## 2017-03-24 DIAGNOSIS — S299XXA Unspecified injury of thorax, initial encounter: Secondary | ICD-10-CM | POA: Diagnosis present

## 2017-03-24 MED ORDER — OXYCODONE-ACETAMINOPHEN 5-325 MG PO TABS: 1.0000 | ORAL_TABLET | Freq: Four times a day (QID) | ORAL | 0 refills | Status: DC | PRN

## 2017-03-24 MED ORDER — CYCLOBENZAPRINE HCL 5 MG PO TABS: 5.0000 mg | ORAL_TABLET | Freq: Three times a day (TID) | ORAL | 0 refills | Status: DC | PRN

## 2017-03-24 MED ORDER — OXYCODONE-ACETAMINOPHEN 5-325 MG PO TABS
1.0000 | ORAL_TABLET | Freq: Once | ORAL | Status: AC
  Administered 2017-03-24: 1 via ORAL
  Filled 2017-03-24: qty 1

## 2017-03-24 NOTE — ED Triage Notes (Signed)
Fell on wet steps at 8am at home.  Fell back and hit her back and left side ribs.  Hurts to breath n ow.

## 2017-03-24 NOTE — Discharge Instructions (Signed)
Take medication as prescribed. Return to emergency department if symptoms worsen and follow-up with PCP as needed.    Get help right away if: You have a fever. You have trouble breathing. You cannot stop coughing. You cough up thick or bloody spit (mucus). You feel sick to your stomach (nauseous), throw up (vomit), or have belly (abdominal) pain. Your pain gets worse and medicine does not help.

## 2017-03-24 NOTE — ED Notes (Signed)
See triage note states she fell on wet steps  Hit left mid back/ posterior rib area

## 2017-03-24 NOTE — ED Provider Notes (Signed)
[facility] Emergency Department Provider Note   ____________________________________________   I have reviewed the triage vital signs and the nursing notes.   HISTORY  Chief Complaint Fall; Back Pain; and Rib Injury    HPI Gabriella Green is a 66 y.o. female presents to the emergency department with left-sided rib pain since slipping and falling on her steps earlier today. Patient reports pain and crepitus along the lower rib area. Patient denies any other injuries associated with the fall. She denies head, neck or back pain. Patient was ambulatory following the injury although severe pain to the left rib area. Patient denied any difficulty breathing or shortness of breath. Patient noted pain with breathing to the left rib area. Patient denies fever, chills, headache, vision changes, chest pain, chest tightness, shortness of breath, abdominal pain, nausea and vomiting.  Past Medical History:  Diagnosis Date  . BRCA negative 2013  . Breast cancer ([place]) 2005   left breast ca  . Breast cancer ([place]) 2006, 1999   right breast ca  . Cancer [facility]) [id number]   right mastectomy and left breast lumpectomy    Patient Active Problem List   Diagnosis Date Noted  . Cancer [facility])     Past Surgical History:  Procedure Laterality Date  . BREAST CYST ASPIRATION Bilateral   . BREAST EXCISIONAL BIOPSY Right 1999   rad breast ca  . BREAST EXCISIONAL BIOPSY Left 2005   rad breast ca  . BREAST EXCISIONAL BIOPSY Right 2006   mastectomy chemo   . BREAST LUMPECTOMY Left 2005   1999 right lumpectomy  . CHOLECYSTECTOMY  2002  . COLONOSCOPY  2009  . MASTECTOMY Right 2006    Prior to Admission medications   Medication Sig Start Date End Date Taking? Authorizing Provider  cyclobenzaprine (FLEXERIL) 5 MG tablet Take 1 tablet (5 mg total) by mouth 3 (three) times daily as needed. 03/24/17   Delwyn Scoggin M, PA-C  oxyCODONE-acetaminophen (ROXICET) 5-325 MG tablet Take 1  tablet by mouth every 6 (six) hours as needed. 03/24/17 03/24/18  Bon Dowis M, PA-C    Allergies Codeine and Tape  Family History  Problem Relation Age of Onset  . Breast cancer Mother 30  . Breast cancer Sister 26  . Breast cancer Maternal Aunt 23    Social History Social History  Substance Use Topics  . Smoking status: Current Every Day Smoker    Packs/day: 1.00    Years: 20.00    Types: Cigarettes  . Smokeless tobacco: Never Used  . Alcohol use No    Review of Systems Constitutional: Negative for fever/chills Eyes: No visual changes. ENT:  Negative for sore throat and for difficulty swallowing Cardiovascular: Denies chest pain. Respiratory: Denies cough. Denies shortness of breath. Gastrointestinal: No abdominal pain.  No nausea, vomiting, diarrhea. Genitourinary: Negative for dysuria. Musculoskeletal: Positive for left-sided rib pain Skin: Negative for rash. Neurological: Negative for headaches.  Negative focal weakness or numbness. Negative for loss of consciousness. Able to ambulate. ____________________________________________   PHYSICAL EXAM:  VITAL SIGNS: ED Triage Vitals  Enc Vitals Group     BP 03/24/17 1051 (!) 117/46     Pulse Rate 03/24/17 1051 76     Resp 03/24/17 1051 16     Temp 03/24/17 1051 98 F (36.7 C)     Temp Source 03/24/17 1051 Oral     SpO2 03/24/17 1051 100 %     Weight 03/24/17 1052 98 lb (44.5 kg)  Height 03/24/17 1052 _0  (1.549 m)     Head Circumference --      Peak Flow --      Pain Score 03/24/17 1051 10     Pain Loc --      Pain Edu? --      Excl. in [place]? --     Constitutional: Alert and oriented. Well appearing and in no acute distress. Eyes: Conjunctivae are normal. PERRL. EOMI  Head: Normocephalic and atraumatic. ENT: Ears:Canals clear. TMs intact bilaterally. Nose: No congestion/rhinnorhea. Mouth/Throat: Mucous membranes are moist.  Neck:Supple. No thyromegaly.No  stridor. Cardiovascular: Normal rate, regular rhythm. Normal S1 and S2. Good peripheral circulation. Respiratory: Normal respiratory effort without tachypnea or retractions. Lungs CTAB. No wheezes/rales/rhonchi.Good air entry to the bases with no decreased or absent breath sounds. Hematological/Lymphatic/Immunological: No cervical lymphadenopathy. Cardiovascular: Normal rate, regular rhythm. Normal distal pulses. Gastrointestinal: Bowel sounds 4 quadrant. No CVA tenderness. Musculoskeletal: Left axillary rib pain along ~ribs 5-8. No crepitus or deformities noted.   Neurologic: Normal speech and language.  Skin: Skin is warm, dry and intact. No rash noted. Psychiatric:Mood and affect are normal. Speech and behavior are normal. Patient exhibits appropriate insight and judgement. ____________________________________________   LABS (all labs ordered are listed, but only abnormal results are displayed)  Labs Reviewed - No data to display ____________________________________________  EKG None ____________________________________________  RADIOLOGY DG ribs unilateral with chest left FINDINGS: Suspected lateral left ninth rib fracture, seen on the oblique view. No hemothorax or pneumothorax.  There is interstitial coarsening. Patient had paraseptal emphysema on 2006 chest CT. There is asymmetric pleural based opacity at the right apex which usually reflects scarring, but need chest CT follow-up in this smoker.  Normal heart size and aortic contours. Cholecystectomy clips.  IMPRESSION: 1. Suspect nondisplaced lateral left ninth rib fracture. No pneumothorax. 2. Asymmetric pleural based opacity at the right apex favors scarring, but chest CT without contrast is recommended in this smoker with emphysematous changes. ____________________________________________   PROCEDURES  Procedure(s) performed: no    Critical Care performed:  no ____________________________________________   INITIAL IMPRESSION / ASSESSMENT AND PLAN / ED COURSE  Pertinent labs & imaging results that were available during my care of the patient were reviewed by me and considered in my medical decision making (see chart for details).   Patient presents to emergency department with left side rib pain since falling on her outside steps earlier this morning. History, physical exam findings and imagingare reassuring symptoms are consistent with fracture of left side rib 9 without displacement or pneumothorax. Patient will be prescribed oxycodone and Flexeril for symptom management. Patient advised to modify activity until fractures heal. Patient advised to follow up with PCP as neededor return to the emergency department if symptoms return or worsen. Patientinformed of clinical course, understand medical decision-making process, and agree with plan.    ____________________________________________   FINAL CLINICAL IMPRESSION(S) / ED DIAGNOSES  Final diagnoses:  Fall in home, initial encounter  Rib pain on left side  Closed fracture of one rib of left side, initial encounter       NEW MEDICATIONS STARTED DURING THIS VISIT:  Discharge Medication List as of 03/24/2017 12:51 PM    START taking these medications   Details  cyclobenzaprine (FLEXERIL) 5 MG tablet Take 1 tablet (5 mg total) by mouth 3 (three) times daily as needed., Starting Mon 03/24/2017, Print    oxyCODONE-acetaminophen (ROXICET) 5-325 MG tablet Take 1 tablet by mouth every 6 (six) hours as needed., Starting Mon  03/24/2017, Until Tue 03/24/2018, Print         Note:  This document was prepared using Dragon voice recognition software and may include unintentional dictation errors.    Jerolyn Shin, PA-C 03/24/17 1333    Lavonia Drafts, MD 03/24/17 1420

## 2017-07-08 HISTORY — PX: BREAST BIOPSY: SHX20

## 2017-10-30 ENCOUNTER — Ambulatory Visit (INDEPENDENT_AMBULATORY_CARE_PROVIDER_SITE_OTHER): Payer: Medicare Other | Admitting: General Surgery

## 2017-10-30 ENCOUNTER — Ambulatory Visit: Payer: Self-pay

## 2017-10-30 ENCOUNTER — Encounter: Payer: Self-pay | Admitting: General Surgery

## 2017-10-30 VITALS — BP 132/68 | HR 84 | Resp 11 | Ht 61.0 in | Wt 100.0 lb

## 2017-10-30 DIAGNOSIS — N63 Unspecified lump in unspecified breast: Secondary | ICD-10-CM | POA: Diagnosis not present

## 2017-10-30 DIAGNOSIS — N6321 Unspecified lump in the left breast, upper outer quadrant: Secondary | ICD-10-CM

## 2017-10-30 NOTE — Progress Notes (Signed)
Patient ID: Gabriella Green, female   DOB: 06-Jan-1951, 67 y.o.   MRN: [id number]  Chief Complaint  Patient presents with  . Mass    HPI Gabriella Green is a 67 y.o. female.  She states that for 8 months she has noticed some puffiness in her left arm pit area. She states that for 2 months she has noticed a lump in that area. She also can feel a lump in the left breast that she noticed a couple nights ago while she was doing self breast exam. She denies pain but if she lays on that left side she has some sensations.  She has been cleaning houses for about 2 years.  HPI  Past Medical History:  Diagnosis Date  . BRCA negative 2013  . Breast cancer ([place]) 2005   left breast ca  . Breast cancer ([place]) 2006, 1999   right breast ca  . Cancer [facility]) [id number]   right mastectomy and left breast lumpectomy    Past Surgical History:  Procedure Laterality Date  . BREAST CYST ASPIRATION Bilateral   . BREAST EXCISIONAL BIOPSY Right 1999   rad breast ca  . BREAST EXCISIONAL BIOPSY Left 2005   rad breast ca  . BREAST EXCISIONAL BIOPSY Right 2006   mastectomy chemo   . BREAST LUMPECTOMY Left 2005   1999 right lumpectomy  . CHOLECYSTECTOMY  2002  . COLONOSCOPY  2009  . MASTECTOMY Right 2006    Family History  Problem Relation Age of Onset  . Breast cancer Mother 38  . Breast cancer Sister 58  . Breast cancer Maternal Aunt 17    Social History Social History   Tobacco Use  . Smoking status: Current Every Day Smoker    Packs/day: 1.00    Years: 20.00    Pack years: 20.00    Types: Cigarettes  . Smokeless tobacco: Never Used  Substance Use Topics  . Alcohol use: No  . Drug use: No    Allergies  Allergen Reactions  . Codeine Nausea And Vomiting  . Tape Swelling    tegaderm     No current outpatient medications on file.   No current facility-administered medications for this visit.     Review of Systems Review of Systems  Constitutional: Negative.   Respiratory:  Negative.   Cardiovascular: Negative.     Blood pressure 132/68, pulse 84, resp. rate 11, height '5\' 1"'  (1.549 m), weight 100 lb (45.4 kg), SpO2 99 %.  Physical Exam Physical Exam  Constitutional: She is oriented to person, place, and time. She appears well-developed and well-nourished.  HENT:  Mouth/Throat: Oropharynx is clear and moist.  Eyes: Conjunctivae are normal. No scleral icterus.  Neck: Neck supple.  Cardiovascular: Normal rate, regular rhythm and normal heart sounds.  Pulmonary/Chest: Effort normal and breath sounds normal. Left breast exhibits no inverted nipple, no mass, no nipple discharge, no skin change and no tenderness.    Right mastectomy and left lumpectomy scar clean.  Lymphadenopathy:    She has no cervical adenopathy.    She has no axillary adenopathy.       Right: No supraclavicular adenopathy present.       Left: No supraclavicular adenopathy present.  Neurological: She is alert and oriented to person, place, and time.  Skin: Skin is warm and dry.  Psychiatric: Her behavior is normal.    Data Reviewed February 12, 2017 left breast screening mammograms reviewed.  Moderately dense breasts without any particular area of architectural abnormality.  BI-RADS-1.  Ultrasound examination was completed of the axilla, upper outer quadrant and periareolar area of the left breast.  There is no evidence of architectural distortion or muscle lesion in the axilla to account for the visible yet nonpalpable fullness.  No prominent axillary lymph nodes.  The area of patient concern in the upper outer quadrant up into the axillary tail shows no evidence of architectural distortion.  Approximately 2 cm from the nipple at the 1 o'clock position there is a small 0.23 x 0.29 x 0.34 hypoechoic nodule without associated architectural distortion.  No clear acoustic shadowing or enhancement.  BI-RADS-3.  Assessment    Focal breast changes noted by patient on her own self-exam without  associated ultrasound or mammographic abnormality.  Focal hypoechoic area in the 1 o'clock position of the left breast which likely warrants FNA or core biopsy after MRI.      Plan    Discussed options for MRI left breast/axilla, recommend office biopsy left breast mass   The patient is aware to call back for any questions or new concerns.      HPI, Physical Exam, Assessment and Plan have been scribed under the direction and in the presence of Robert Bellow, MD. Karie Fetch, RN  I have completed the exam and reviewed the above documentation for accuracy and completeness.  I agree with the above.  Haematologist has been used and any errors in dictation or transcription are unintentional.  Hervey Ard, M.D., F.A.C.S.  Forest Gleason Linc Renne 10/30/2017, 9:24 PM  Patient to be scheduled for a left breast MRI WWO contrast at the [facility]. Per Manus Gunning at the [facility], the patient will not need a mammogram prior as long as patient has MRI prior to August. They will contact the patient directly with screening questions and arrange a time for patient to come when convenient. Patient aware to be expecting their call.   Dominga Ferry, CMA

## 2017-10-30 NOTE — Patient Instructions (Addendum)
The patient is aware to call back for any questions or concerns.    MRI left breast/axilla

## 2017-10-31 ENCOUNTER — Telehealth: Payer: Self-pay

## 2017-10-31 DIAGNOSIS — Z853 Personal history of malignant neoplasm of breast: Secondary | ICD-10-CM

## 2017-10-31 DIAGNOSIS — N6321 Unspecified lump in the left breast, upper outer quadrant: Secondary | ICD-10-CM

## 2017-10-31 NOTE — Telephone Encounter (Signed)
Gabriella Green with the [facility] called and states that they will not be able to schedule the patient's breast MRI until she has a mammogram completed of the left breast. We had been told previously that this was not necessary. She states that this is their standard protocol.   An order for a left breast mammogram has been placed with [facility] and the patient will be contacted with the date and time. Once this is scheduled the [facility] will be able to schedule the patient's breast MRI.

## 2017-10-31 NOTE — Telephone Encounter (Signed)
I spoke with Gabriella Green at the [facility] and informed her that the patient is scheduled for a mammogram at [facility] on 11/07/17. She will go ahead and contact the patient to schedule her Breast MRI.

## 2017-11-04 ENCOUNTER — Telehealth: Payer: Self-pay

## 2017-11-04 NOTE — Telephone Encounter (Signed)
Patient called and would like to speak with you about her imaging. Specifically she would like to know why she needs an MRI completed for this problem. She wants to know why she couldn't have a biopsy if the area was seen on ultrasound.

## 2017-11-05 NOTE — Telephone Encounter (Signed)
Per Mardene Celeste at the [facility], patient's order for breast MRI needs to be changed from left breast MRI WWO contrast to bilateral breast MRI WWO contrast per their protocol even though the patient had a right mastectomy.   Dr. Bary Castilla notified. Order changed in [place].  Patient aware to have mammogram as scheduled and that the [facility] will be calling the patient directly to arrange MRI. She verbalizes understanding.

## 2017-11-05 NOTE — Addendum Note (Signed)
Addended by: Dominga Ferry on: 11/05/2017 03:48 PM   Modules accepted: Orders

## 2017-11-07 ENCOUNTER — Ambulatory Visit
Admission: RE | Admit: 2017-11-07 | Discharge: 2017-11-07 | Disposition: A | Discharge status: Home / Self Care | Payer: Medicare Other | Source: Ambulatory Visit | Attending: General Surgery | Admitting: General Surgery

## 2017-11-07 DIAGNOSIS — Z853 Personal history of malignant neoplasm of breast: Secondary | ICD-10-CM | POA: Diagnosis not present

## 2017-11-07 DIAGNOSIS — N6321 Unspecified lump in the left breast, upper outer quadrant: Secondary | ICD-10-CM | POA: Insufficient documentation

## 2017-11-07 HISTORY — DX: Personal history of irradiation: Z92.3

## 2017-11-18 ENCOUNTER — Ambulatory Visit
Admission: RE | Admit: 2017-11-18 | Discharge: 2017-11-18 | Disposition: A | Discharge status: Home / Self Care | Payer: Medicare Other | Source: Ambulatory Visit | Attending: General Surgery | Admitting: General Surgery

## 2017-11-18 ENCOUNTER — Telehealth: Payer: Self-pay | Admitting: General Surgery

## 2017-11-18 DIAGNOSIS — N6321 Unspecified lump in the left breast, upper outer quadrant: Secondary | ICD-10-CM

## 2017-11-18 MED ORDER — GADOBENATE DIMEGLUMINE 529 MG/ML IV SOLN
9.0000 mL | Freq: Once | INTRAVENOUS | Status: AC | PRN
  Administered 2017-11-18: 9 mL via INTRAVENOUS

## 2017-11-18 NOTE — Telephone Encounter (Signed)
Patient notified of MRI results from today: Abnormal uptake above old lumpectomy site. No mammographic or ultrasound correlation on either [facility] or my office studies. Recommended MRI guided biopsy. Patient amenable.  Axilla is negative.

## 2017-11-19 ENCOUNTER — Telehealth: Payer: Self-pay

## 2017-11-19 ENCOUNTER — Other Ambulatory Visit: Payer: Self-pay

## 2017-11-19 DIAGNOSIS — N6321 Unspecified lump in the left breast, upper outer quadrant: Secondary | ICD-10-CM

## 2017-11-19 NOTE — Telephone Encounter (Signed)
Patient notified order has been placed for MRI Guided Left breast biopsy. [facility] will contact the patient directly to schedule this.

## 2017-11-19 NOTE — Telephone Encounter (Signed)
-----   Message from Robert Bellow, MD sent at 11/18/2017  6:01 PM EDT ----- Please schedule for left breast MRI guided biopsy. Thanks.

## 2017-11-24 ENCOUNTER — Other Ambulatory Visit (HOSPITAL_COMMUNITY): Payer: Self-pay | Admitting: Diagnostic Radiology

## 2017-11-24 ENCOUNTER — Ambulatory Visit
Admission: RE | Admit: 2017-11-24 | Discharge: 2017-11-24 | Disposition: A | Discharge status: Home / Self Care | Payer: Medicare Other | Source: Ambulatory Visit | Attending: General Surgery | Admitting: General Surgery

## 2017-11-24 ENCOUNTER — Other Ambulatory Visit: Payer: Self-pay | Admitting: General Surgery

## 2017-11-24 DIAGNOSIS — N6320 Unspecified lump in the left breast, unspecified quadrant: Secondary | ICD-10-CM

## 2017-11-24 DIAGNOSIS — N6321 Unspecified lump in the left breast, upper outer quadrant: Secondary | ICD-10-CM

## 2017-11-24 MED ORDER — GADOBENATE DIMEGLUMINE 529 MG/ML IV SOLN
9.0000 mL | Freq: Once | INTRAVENOUS | Status: AC | PRN
  Administered 2017-11-24: 9 mL via INTRAVENOUS

## 2017-12-01 ENCOUNTER — Telehealth: Payer: Self-pay | Admitting: General Surgery

## 2017-12-01 NOTE — Telephone Encounter (Signed)
The patient had undergone a breast MRI to assess an area of focal fullness in the left axilla.  An incidental finding was an area of non-mass-effect uptake in the breast.  She subsequently underwent an MRI biopsy on Nov 24, 2017.  Biopsy results did not become available to me until Nov 28, 2017.  She was contacted today and confirmed that she had been contacted by the radiology service with the results.  We do not have an explanation for the vague fullness in the left axilla but with negative imaging by ultrasound, mammography and MRI for discrete pathologic process, observation alone is warranted.  She should resume annual screening mammograms.   These will be arrange for April 2020, as the left breast was imaged in May 2019.

## 2017-12-31 ENCOUNTER — Other Ambulatory Visit: Payer: Self-pay | Admitting: Family Medicine

## 2017-12-31 DIAGNOSIS — E2839 Other primary ovarian failure: Secondary | ICD-10-CM

## 2018-08-25 ENCOUNTER — Ambulatory Visit (INDEPENDENT_AMBULATORY_CARE_PROVIDER_SITE_OTHER): Payer: Medicare Other | Admitting: Gastroenterology

## 2018-08-25 ENCOUNTER — Encounter: Payer: Self-pay | Admitting: Gastroenterology

## 2018-08-25 ENCOUNTER — Other Ambulatory Visit: Payer: Self-pay

## 2018-08-25 VITALS — BP 131/63 | HR 75 | Resp 17 | Ht 61.0 in | Wt 98.8 lb

## 2018-08-25 DIAGNOSIS — Z1211 Encounter for screening for malignant neoplasm of colon: Secondary | ICD-10-CM | POA: Diagnosis not present

## 2018-08-25 DIAGNOSIS — R110 Nausea: Secondary | ICD-10-CM | POA: Diagnosis not present

## 2018-08-25 NOTE — Progress Notes (Addendum)
Cephas Darby, MD [address]  [place]  [place], [place] [address]  Main: [phone]  Fax: [phone]    Gastroenterology Consultation  Referring Provider:     Theotis Burrow* Primary Care Physician:  Theotis Burrow, MD Primary Gastroenterologist:  Dr. Cephas Darby Reason for Consultation:     Persistent nausea        HPI:   Gabriella Green is a 68 y.o. female referred by Dr. Alene Mires, Elyse Jarvis, MD  for consultation & management of persistent nausea.  This has started 6weeks ago, occurs about 3 days a week, prevents from eating, lost 2 pounds, no particular food triggers. Is just started on zofran and ranitidine by his PCP.  She had similar episode 2 years ago, treated with zofran and ranitidine, resolved in 2 days.  She denies any other symptoms. Bowels regular  She had a history of bilateral breast cancer, underwent right mastectomy, currently in remission, not on any hormonal therapy She is a chronic smoker  NSAIDs: None  Antiplts/Anticoagulants/Anti thrombotics: None  GI Procedures: Reports having had a colonoscopy in the past Reportedly normal  She denies family history of GI malignancy  Past Medical History:  Diagnosis Date  . BRCA negative 2013  . Breast cancer ([place]) 2005   left breast ca  . Breast cancer ([place]) 2006, 1999   right breast ca  . Cancer [facility]) [id number]   right mastectomy and left breast lumpectomy  . Personal history of radiation therapy     Past Surgical History:  Procedure Laterality Date  . BREAST CYST ASPIRATION Bilateral   . BREAST EXCISIONAL BIOPSY Right 1999   rad breast ca  . BREAST EXCISIONAL BIOPSY Left 2005   rad breast ca  . BREAST EXCISIONAL BIOPSY Right 2006   mastectomy chemo   . BREAST LUMPECTOMY Left 2005   1999 right lumpectomy  . CHOLECYSTECTOMY  2002  . COLONOSCOPY  2009  . MASTECTOMY Right 2006    No current outpatient medications on file.   Family History  Problem  Relation Age of Onset  . Breast cancer Mother 79  . Breast cancer Sister 10  . Breast cancer Maternal Aunt 39     Social History   Tobacco Use  . Smoking status: Current Every Day Smoker    Packs/day: 1.00    Years: 20.00    Pack years: 20.00    Types: Cigarettes  . Smokeless tobacco: Never Used  Substance Use Topics  . Alcohol use: No  . Drug use: No    Allergies as of 08/25/2018 - Review Complete 08/25/2018  Allergen Reaction Noted  . Codeine Nausea And Vomiting 08/28/2012  . Tape Swelling 08/28/2012    Review of Systems:    All systems reviewed and negative except where noted in HPI.   Physical Exam:  BP 131/63 (BP Location: Left Arm, Patient Position: Sitting, Cuff Size: Normal)   Pulse 75   Resp 17   Ht '5\' 1"'  (1.549 m)   Wt 98 lb 12.8 oz (44.8 kg)   BMI 18.67 kg/m  No LMP recorded. Patient is postmenopausal.  General:   Alert, thin built, moderately nourished, pleasant and cooperative in NAD Head:  Normocephalic and atraumatic. Eyes:  Sclera clear, no icterus.   Conjunctiva pink. Ears:  Normal auditory acuity. Nose:  No deformity, discharge, or lesions. Mouth:  No deformity or lesions,oropharynx pink & moist. Neck:  Supple; no masses or thyromegaly. Lungs:  Respirations even and unlabored.  Clear throughout to auscultation.   No wheezes, crackles, or rhonchi. No acute distress. Heart:  Regular rate and rhythm; no murmurs, clicks, rubs, or gallops. Abdomen:  Normal bowel sounds. Soft, non-tender and non-distended without masses, hepatosplenomegaly or hernias noted.  No guarding or rebound tenderness.   Rectal: Not performed Msk:  Symmetrical without gross deformities. Good, equal movement & strength bilaterally. Pulses:  Normal pulses noted. Extremities:  No clubbing or edema.  No cyanosis. Neurologic:  Alert and oriented x3;  grossly normal neurologically. Skin:  Intact without significant lesions or rashes. No jaundice. Psych:  Alert and cooperative.  Normal mood and affect.  Imaging Studies: No abdominal imaging  Assessment and Plan:   Gabriella Green is a 68 y.o. female with history of bilateral breast cancer, right-sided mastectomy, currently in remission, chronic tobacco use presents with 6 weeks history of intermittent nausea without emesis, limiting p.o. intake, weight loss.  Similar episode about 2 years ago which resolved after taking H2 blocker and antiemetics.  Given her age and history of smoking Recommend EGD to evaluate for peptic ulcer disease or H. pylori gastritis In the interim, she can continue Zofran and H2 blocker as needed Also recommend colonoscopy for colon cancer screening  If EGD is unremarkable, recommend CT abdomen to evaluate for pancreas given her history of heavy tobacco use  Will obtain latest laboratory results from her PCP  I have discussed alternative options, risks & benefits,  which include, but are not limited to, bleeding, infection, perforation,respiratory complication & drug reaction.  The patient agrees with this plan & written consent will be obtained.    Follow up in 2 months   Cephas Darby, MD

## 2018-08-26 ENCOUNTER — Encounter: Payer: Self-pay | Admitting: Family Medicine

## 2018-09-03 ENCOUNTER — Ambulatory Visit: Payer: Medicare Other | Admitting: Certified Registered"

## 2018-09-03 ENCOUNTER — Telehealth: Payer: Self-pay | Admitting: Gastroenterology

## 2018-09-03 ENCOUNTER — Other Ambulatory Visit: Payer: Self-pay

## 2018-09-03 ENCOUNTER — Ambulatory Visit
Admission: RE | Admit: 2018-09-03 | Discharge: 2018-09-03 | Disposition: A | Discharge status: Home / Self Care | Payer: Medicare Other | Source: Ambulatory Visit | Attending: Gastroenterology | Admitting: Gastroenterology

## 2018-09-03 ENCOUNTER — Encounter
Admission: RE | Disposition: A | Discharge status: Home / Self Care | Payer: Self-pay | Source: Ambulatory Visit | Attending: Gastroenterology

## 2018-09-03 DIAGNOSIS — R110 Nausea: Secondary | ICD-10-CM

## 2018-09-03 DIAGNOSIS — Z923 Personal history of irradiation: Secondary | ICD-10-CM | POA: Insufficient documentation

## 2018-09-03 DIAGNOSIS — K2950 Unspecified chronic gastritis without bleeding: Secondary | ICD-10-CM | POA: Diagnosis not present

## 2018-09-03 DIAGNOSIS — Z1211 Encounter for screening for malignant neoplasm of colon: Secondary | ICD-10-CM

## 2018-09-03 DIAGNOSIS — K3189 Other diseases of stomach and duodenum: Secondary | ICD-10-CM

## 2018-09-03 DIAGNOSIS — K259 Gastric ulcer, unspecified as acute or chronic, without hemorrhage or perforation: Secondary | ICD-10-CM | POA: Insufficient documentation

## 2018-09-03 DIAGNOSIS — Z853 Personal history of malignant neoplasm of breast: Secondary | ICD-10-CM | POA: Insufficient documentation

## 2018-09-03 DIAGNOSIS — D124 Benign neoplasm of descending colon: Secondary | ICD-10-CM | POA: Insufficient documentation

## 2018-09-03 DIAGNOSIS — Z9011 Acquired absence of right breast and nipple: Secondary | ICD-10-CM | POA: Insufficient documentation

## 2018-09-03 DIAGNOSIS — K5730 Diverticulosis of large intestine without perforation or abscess without bleeding: Secondary | ICD-10-CM | POA: Insufficient documentation

## 2018-09-03 DIAGNOSIS — F17210 Nicotine dependence, cigarettes, uncomplicated: Secondary | ICD-10-CM | POA: Insufficient documentation

## 2018-09-03 HISTORY — PX: COLONOSCOPY WITH PROPOFOL: SHX5780

## 2018-09-03 HISTORY — PX: ESOPHAGOGASTRODUODENOSCOPY (EGD) WITH PROPOFOL: SHX5813

## 2018-09-03 SURGERY — ESOPHAGOGASTRODUODENOSCOPY (EGD) WITH PROPOFOL
Anesthesia: General

## 2018-09-03 MED ORDER — SODIUM CHLORIDE 0.9 % IV SOLN
INTRAVENOUS | Status: DC
  Administered 2018-09-03: 1000 mL via INTRAVENOUS

## 2018-09-03 MED ORDER — PROPOFOL 10 MG/ML IV BOLUS
INTRAVENOUS | Status: AC
  Filled 2018-09-03: qty 40

## 2018-09-03 MED ORDER — GLYCOPYRROLATE 0.2 MG/ML IJ SOLN
INTRAMUSCULAR | Status: DC | PRN
  Administered 2018-09-03: 0.2 mg via INTRAVENOUS

## 2018-09-03 MED ORDER — PROPOFOL 500 MG/50ML IV EMUL
INTRAVENOUS | Status: AC
  Filled 2018-09-03: qty 300

## 2018-09-03 MED ORDER — OMEPRAZOLE 40 MG PO CPDR: 40.0000 mg | DELAYED_RELEASE_CAPSULE | Freq: Every day | ORAL | 0 refills | Status: DC

## 2018-09-03 MED ORDER — PROPOFOL 10 MG/ML IV BOLUS
INTRAVENOUS | Status: DC | PRN
  Administered 2018-09-03 (×9): 20 mg via INTRAVENOUS
  Administered 2018-09-03: 30 mg via INTRAVENOUS
  Administered 2018-09-03: 20 mg via INTRAVENOUS
  Administered 2018-09-03: 30 mg via INTRAVENOUS
  Administered 2018-09-03: 20 mg via INTRAVENOUS

## 2018-09-03 NOTE — Progress Notes (Signed)
Pt has history of mastectomy on Right side and lumpectomy with lymph node removal on left side. Consulted Dr. Andree Elk with anesthesia, Verbal orders to start IV in right arm, BP's are ok in left arm. Verified verbal orders with Dr. Andree Elk.

## 2018-09-03 NOTE — Anesthesia Post-op Follow-up Note (Signed)
Anesthesia QCDR form completed.        

## 2018-09-03 NOTE — Telephone Encounter (Signed)
Patient called back & would like her medication called into [facility] on [address],since the other pharmacy will closing Friday  at 1:00 pm.

## 2018-09-03 NOTE — Telephone Encounter (Signed)
Pt is calling she states she just left the hospital  And Dr.Vanga wanted her to go home and check if she has rx Omeprazole and she does not so Dr.Vanga would call her in rx Omeprazole to [facility] their # [phone] fax [phone]

## 2018-09-03 NOTE — Transfer of Care (Signed)
Immediate Anesthesia Transfer of Care Note  Patient: Gabriella Green  Procedure(s) Performed: ESOPHAGOGASTRODUODENOSCOPY (EGD) WITH PROPOFOL (N/A ) COLONOSCOPY WITH PROPOFOL (N/A )  Patient Location: Endoscopy Unit  Anesthesia Type:General  Level of Consciousness: drowsy and patient cooperative  Airway & Oxygen Therapy: Patient Spontanous Breathing and Patient connected to face mask oxygen  Post-op Assessment: Report given to RN and Post -op Vital signs reviewed and stable  Post vital signs: Reviewed and stable  Last Vitals:  Vitals Value Taken Time  BP 114/53 09/03/2018  9:00 AM  Temp    Pulse 73 09/03/2018  9:05 AM  Resp 20 09/03/2018  9:05 AM  SpO2 100 % 09/03/2018  9:05 AM  Vitals shown include unvalidated device data.  Last Pain:  Vitals:   09/03/18 0749  TempSrc: Tympanic  PainSc: 0-No pain         Complications: No apparent anesthesia complications

## 2018-09-03 NOTE — Op Note (Signed)
[facility] Patient Name: Gabriella Green Procedure Date: 09/03/2018 7:25 AM MRN: [id number] Account #: [id number] Date of Birth: [birth year] Admit Type: Outpatient Age: 68 Room: [facility] ENDO ROOM 3 Gender: Female Note Status: Finalized Procedure:            Colonoscopy Indications:          Screening for colorectal malignant neoplasm, Last                        colonoscopy: August 2002 Providers:            Lin Landsman MD, MD Medicines:            General Anesthesia Complications:        No immediate complications. Estimated blood loss: None. Procedure:            Pre-Anesthesia Assessment:                       - Prior to the procedure, a History and Physical was                        performed, and patient medications and allergies were                        reviewed. The patient is competent. The risks and                        benefits of the procedure and the sedation options and                        risks were discussed with the patient. All questions                        were answered and informed consent was obtained.                        Patient identification and proposed procedure were                        verified by the physician, the nurse, the                        anesthesiologist, the anesthetist and the technician in                        the pre-procedure area in the procedure room in the                        endoscopy suite. Mental Status Examination: alert and                        oriented. Airway Examination: normal oropharyngeal                        airway and neck mobility. Respiratory Examination:                        clear to auscultation. CV Examination: normal.                        Prophylactic  Antibiotics: The patient does not require                        prophylactic antibiotics. Prior Anticoagulants: The                        patient has taken no previous anticoagulant or        antiplatelet agents. ASA Grade Assessment: II - A                        patient with mild systemic disease. After reviewing the                        risks and benefits, the patient was deemed in                        satisfactory condition to undergo the procedure. The                        anesthesia plan was to use general anesthesia.                        Immediately prior to administration of medications, the                        patient was re-assessed for adequacy to receive                        sedatives. The heart rate, respiratory rate, oxygen                        saturations, blood pressure, adequacy of pulmonary                        ventilation, and response to care were monitored                        throughout the procedure. The physical status of the                        patient was re-assessed after the procedure.                       After obtaining informed consent, the colonoscope was                        passed under direct vision. Throughout the procedure,                        the patient's blood pressure, pulse, and oxygen                        saturations were monitored continuously. The                        Colonoscope was introduced through the anus and                        advanced to the the cecum, identified by appendiceal  orifice and ileocecal valve. The colonoscopy was                        performed without difficulty. The patient tolerated the                        procedure well. The quality of the bowel preparation                        was inadequate. Findings:      The perianal and digital rectal examinations were normal. Pertinent       negatives include normal sphincter tone and no palpable rectal lesions.      Copious quantities of semi-liquid stool was found in the entire colon,       precluding visualization. Lavage of the area was performed using 50 -       200 mL of sterile water, resulting  in incomplete clearance with fair       visualization.      Two sessile polyps were found in the descending colon. The polyps were 4       to 5 mm in size. These polyps were removed with a cold snare. Resection       and retrieval were complete.      The retroflexed view of the distal rectum and anal verge was normal and       showed no anal or rectal abnormalities.      Multiple diverticula were found in the sigmoid colon. Impression:           - Preparation of the colon was inadequate.                       - Stool in the entire examined colon.                       - Two 4 to 5 mm polyps in the descending colon, removed                        with a cold snare. Resected and retrieved.                       - The distal rectum and anal verge are normal on                        retroflexion view. Recommendation:       - Discharge patient to home (with escort).                       - Resume previous diet today.                       - Continue present medications.                       - Await pathology results.                       - Repeat colonoscopy in 6 months because the bowel                        preparation was poor.                       -  Return to my office as previously scheduled. Procedure Code(s):    --- Professional ---                       [phone], Colonoscopy, flexible; with removal of tumor(s),                        polyp(s), or other lesion(s) by snare technique Diagnosis Code(s):    --- Professional ---                       Z12.11, Encounter for screening for malignant neoplasm                        of colon                       D12.4, Benign neoplasm of descending colon CPT copyright 2018 American Medical Association. All rights reserved. The codes documented in this report are preliminary and upon coder review may  be revised to meet current compliance requirements. Dr. Ulyess Mort Lin Landsman MD, MD 09/03/2018 9:01:20 AM This report has been  signed electronically. Number of Addenda: 0 Note Initiated On: 09/03/2018 7:25 AM Scope Withdrawal Time: 0 hours 8 minutes 45 seconds  Total Procedure Duration: 0 hours 13 minutes 8 seconds       [facility]

## 2018-09-03 NOTE — H&P (Signed)
Gabriella Darby, MD [address]  [place]  [place], [place] [address]  Main: [phone]  Fax: [phone] Pager: [phone]  Primary Care Physician:  Theotis Burrow, MD Primary Gastroenterologist:  Dr. Cephas Green  Pre-Procedure History & Physical: HPI:  CHAREE Green is a 68 y.o. female is here for an endoscopy and colonoscopy.   Past Medical History:  Diagnosis Date  . BRCA negative 2013  . Breast cancer ([place]) 2005   left breast ca  . Breast cancer ([place]) 2006, 1999   right breast ca  . Cancer Gabriella Green) [id number]   right mastectomy and left breast lumpectomy  . Personal history of radiation therapy     Past Surgical History:  Procedure Laterality Date  . BREAST CYST ASPIRATION Bilateral   . BREAST EXCISIONAL BIOPSY Right 1999   rad breast ca  . BREAST EXCISIONAL BIOPSY Left 2005   rad breast ca  . BREAST EXCISIONAL BIOPSY Right 2006   mastectomy chemo   . BREAST LUMPECTOMY Left 2005   1999 right lumpectomy  . CHOLECYSTECTOMY  2002  . COLONOSCOPY  2009  . MASTECTOMY Right 2006    Prior to Admission medications   Not on File    Allergies as of 08/25/2018 - Review Complete 08/25/2018  Allergen Reaction Noted  . Codeine Nausea And Vomiting 08/28/2012  . Tape Swelling 08/28/2012    Family History  Problem Relation Age of Onset  . Breast cancer Mother 4  . Breast cancer Sister 58  . Breast cancer Maternal Aunt 67    Social History   Socioeconomic History  . Marital status: Widowed    Spouse name: Not on file  . Number of children: Not on file  . Years of education: Not on file  . Highest education level: Not on file  Occupational History  . Not on file  Social Needs  . Financial resource strain: Not on file  . Food insecurity:    Worry: Not on file    Inability: Not on file  . Transportation needs:    Medical: Not on file    Non-medical: Not on file  Tobacco Use  . Smoking status: Current Every Day Smoker    Packs/day:  1.00    Years: 20.00    Pack years: 20.00    Types: Cigarettes  . Smokeless tobacco: Never Used  Substance and Sexual Activity  . Alcohol use: No  . Drug use: No  . Sexual activity: Not on file  Lifestyle  . Physical activity:    Days per week: Not on file    Minutes per session: Not on file  . Stress: Not on file  Relationships  . Social connections:    Talks on phone: Not on file    Gets together: Not on file    Attends religious service: Not on file    Active member of club or organization: Not on file    Attends meetings of clubs or organizations: Not on file    Relationship status: Not on file  . Intimate partner violence:    Fear of current or ex partner: Not on file    Emotionally abused: Not on file    Physically abused: Not on file    Forced sexual activity: Not on file  Other Topics Concern  . Not on file  Social History Narrative  . Not on file    Review of Systems: See HPI, otherwise negative ROS  Physical Exam: BP (!) 182/102  Pulse 94   Temp (!) 97.2 F (36.2 C) (Tympanic)   Resp 20   Ht '5\' 1"'  (1.549 m)   Wt 44 kg   SpO2 100%   BMI 18.33 kg/m  General:   Alert,  pleasant and cooperative in NAD Head:  Normocephalic and atraumatic. Neck:  Supple; no masses or thyromegaly. Lungs:  Clear throughout to auscultation.    Heart:  Regular rate and rhythm. Abdomen:  Soft, nontender and nondistended. Normal bowel sounds, without guarding, and without rebound.   Neurologic:  Alert and  oriented x4;  grossly normal neurologically.  Impression/Plan: Gabriella Green is here for an endoscopy and colonoscopy to be performed for persistent nausea and colon cancer screening   Risks, benefits, limitations, and alternatives regarding  endoscopy and colonoscopy have been reviewed with the patient.  Questions have been answered.  All parties agreeable.   Sherri Sear, MD  09/03/2018, 8:25 AM

## 2018-09-03 NOTE — Op Note (Signed)
[facility] Patient Name: Gabriella Green Procedure Date: 09/03/2018 7:45 AM MRN: [id number] Account #: [id number] Date of Birth: August 17, 1950 Admit Type: Outpatient Age: 68 Room: [facility] ENDO ROOM 3 Gender: Female Note Status: Finalized Procedure:            Upper GI endoscopy Indications:          Nausea Providers:            Lin Landsman MD, MD Medicines:            General Anesthesia Complications:        No immediate complications. Estimated blood loss: None. Procedure:            Pre-Anesthesia Assessment:                       - Prior to the procedure, a History and Physical was                        performed, and patient medications and allergies were                        reviewed. The patient is competent. The risks and                        benefits of the procedure and the sedation options and                        risks were discussed with the patient. All questions                        were answered and informed consent was obtained.                        Patient identification and proposed procedure were                        verified by the physician, the nurse, the                        anesthesiologist, the anesthetist and the technician in                        the pre-procedure area in the procedure room in the                        endoscopy suite. Mental Status Examination: alert and                        oriented. Airway Examination: normal oropharyngeal                        airway and neck mobility. Respiratory Examination:                        clear to auscultation. CV Examination: normal.                        Prophylactic Antibiotics: The patient does not require  prophylactic antibiotics. Prior Anticoagulants: The                        patient has taken no previous anticoagulant or                        antiplatelet agents. ASA Grade Assessment: II - A   patient with mild systemic disease. After reviewing the                        risks and benefits, the patient was deemed in                        satisfactory condition to undergo the procedure. The                        anesthesia plan was to use general anesthesia.                        Immediately prior to administration of medications, the                        patient was re-assessed for adequacy to receive                        sedatives. The heart rate, respiratory rate, oxygen                        saturations, blood pressure, adequacy of pulmonary                        ventilation, and response to care were monitored                        throughout the procedure. The physical status of the                        patient was re-assessed after the procedure.                       After obtaining informed consent, the endoscope was                        passed under direct vision. Throughout the procedure,                        the patient's blood pressure, pulse, and oxygen                        saturations were monitored continuously. The Endoscope                        was introduced through the mouth, and advanced to the                        second part of duodenum. The upper GI endoscopy was                        accomplished without difficulty. The patient tolerated  the procedure well. Findings:      The duodenal bulb and second portion of the duodenum were normal.      Multiple dispersed, diminutive non-bleeding erosions were found in the       gastric body. There were stigmata of recent bleeding. Biopsies were       taken with a cold forceps for Helicobacter pylori testing.      One non-bleeding superficial healing gastric ulcer was found on the       posterior wall of the gastric antrum.      The gastroesophageal junction and examined esophagus were normal. Impression:           - Normal duodenal bulb and second portion of the                         duodenum.                       - Non-bleeding erosive gastropathy. Biopsied.                       - Non-bleeding gastric ulcer.                       - Normal gastroesophageal junction and esophagus. Recommendation:       - Await pathology results.                       - No ibuprofen, naproxen, or other non-steroidal                        anti-inflammatory drugs.                       - Use a proton pump inhibitor PO BID for 3 months.                       - Abstinance from smoking Procedure Code(s):    --- Professional ---                       [phone], Esophagogastroduodenoscopy, flexible, transoral;                        with biopsy, single or multiple Diagnosis Code(s):    --- Professional ---                       K31.89, Other diseases of stomach and duodenum                       K25.9, Gastric ulcer, unspecified as acute or chronic,                        without hemorrhage or perforation                       R11.0, Nausea CPT copyright 2018 American Medical Association. All rights reserved. The codes documented in this report are preliminary and upon coder review may  be revised to meet current compliance requirements. Dr. Ulyess Mort Lin Landsman MD, MD 09/03/2018 8:42:32 AM This report has been signed electronically. Number of Addenda: 0 Note Initiated On: 09/03/2018 7:45 AM      [facility]  Center

## 2018-09-03 NOTE — Anesthesia Preprocedure Evaluation (Signed)
Anesthesia Evaluation  Patient identified by MRN, date of birth, ID band Patient awake    Reviewed: Allergy & Precautions, H&P , NPO status , Patient's Chart, lab work & pertinent test results, reviewed documented beta blocker date and time   Airway Mallampati: II   Neck ROM: full    Dental  (+) Poor Dentition   Pulmonary neg pulmonary ROS, Current Smoker,    Pulmonary exam normal        Cardiovascular Exercise Tolerance: Good negative cardio ROS Normal cardiovascular exam Rhythm:regular Rate:Normal     Neuro/Psych negative neurological ROS  negative psych ROS   GI/Hepatic negative GI ROS, Neg liver ROS,   Endo/Other  negative endocrine ROS  Renal/GU negative Renal ROS  negative genitourinary   Musculoskeletal   Abdominal   Peds  Hematology negative hematology ROS (+)   Anesthesia Other Findings Past Medical History: 2013: BRCA negative 2005: Breast cancer ([place])     Comment:  left breast ca 2006, 1999: Breast cancer ([place])     Comment:  right breast ca 6568,1275: Cancer ([place])     Comment:  right mastectomy and left breast lumpectomy No date: Personal history of radiation therapy Past Surgical History: No date: BREAST CYST ASPIRATION; Bilateral 1999: BREAST EXCISIONAL BIOPSY; Right     Comment:  rad breast ca 2005: BREAST EXCISIONAL BIOPSY; Left     Comment:  rad breast ca 2006: BREAST EXCISIONAL BIOPSY; Right     Comment:  mastectomy chemo  2005: BREAST LUMPECTOMY; Left     Comment:  1999 right lumpectomy 2002: CHOLECYSTECTOMY 2009: COLONOSCOPY 2006: MASTECTOMY; Right BMI    Body Mass Index:  18.33 kg/m     Reproductive/Obstetrics negative OB ROS                             Anesthesia Physical Anesthesia Plan  ASA: II  Anesthesia Plan: General   Post-op Pain Management:    Induction:   PONV Risk Score and Plan:   Airway Management Planned:   Additional  Equipment:   Intra-op Plan:   Post-operative Plan:   Informed Consent: I have reviewed the patients History and Physical, chart, labs and discussed the procedure including the risks, benefits and alternatives for the proposed anesthesia with the patient or authorized representative who has indicated his/her understanding and acceptance.     Dental Advisory Given  Plan Discussed with: CRNA  Anesthesia Plan Comments: (Hx of bilateral lymph node removal upper extremities and right side mastectomy.  Secondary to positioning we will place IV in Right UE.  Risks associated discussed with patient. JA)        Anesthesia Quick Evaluation

## 2018-09-04 ENCOUNTER — Encounter: Payer: Self-pay | Admitting: Gastroenterology

## 2018-09-07 LAB — SURGICAL PATHOLOGY

## 2018-09-08 ENCOUNTER — Other Ambulatory Visit: Payer: Self-pay

## 2018-09-08 DIAGNOSIS — Z1231 Encounter for screening mammogram for malignant neoplasm of breast: Secondary | ICD-10-CM

## 2018-09-12 NOTE — Anesthesia Postprocedure Evaluation (Signed)
Anesthesia Post Note  Patient: Gabriella Green  Procedure(s) Performed: ESOPHAGOGASTRODUODENOSCOPY (EGD) WITH PROPOFOL (N/A ) COLONOSCOPY WITH PROPOFOL (N/A )  Patient location during evaluation: PACU Anesthesia Type: General Level of consciousness: awake and alert Pain management: pain level controlled Vital Signs Assessment: post-procedure vital signs reviewed and stable Respiratory status: spontaneous breathing, nonlabored ventilation, respiratory function stable and patient connected to nasal cannula oxygen Cardiovascular status: blood pressure returned to baseline and stable Postop Assessment: no apparent nausea or vomiting Anesthetic complications: no     Last Vitals:  Vitals:   09/03/18 0900 09/03/18 0903  BP: (!) 114/53 (!) 114/53  Pulse: 74 75  Resp: 18 20  Temp:  36.4 C  SpO2: 100% 100%    Last Pain:  Vitals:   09/04/18 0736  TempSrc:   PainSc: 0-No pain                 Molli Barrows

## 2018-10-29 ENCOUNTER — Ambulatory Visit: Payer: Medicare Other | Admitting: Gastroenterology

## 2018-10-29 ENCOUNTER — Ambulatory Visit (INDEPENDENT_AMBULATORY_CARE_PROVIDER_SITE_OTHER): Payer: Medicare Other | Admitting: Gastroenterology

## 2018-10-29 DIAGNOSIS — K279 Peptic ulcer, site unspecified, unspecified as acute or chronic, without hemorrhage or perforation: Secondary | ICD-10-CM | POA: Diagnosis not present

## 2018-10-29 NOTE — Progress Notes (Signed)
Sherri Sear, MD [address]  [place]  [place], [place] [address]  Main: [phone]  Fax: [phone]    Gastroenterology follow-up tele Visit  Referring Provider:     Theotis Burrow* Primary Care Physician:  Theotis Burrow, MD Primary Gastroenterologist:  Dr. Cephas Darby Reason for Consultation:     Persistent nausea, peptic ulcer disease        HPI:   Gabriella Green is a 68 y.o. female referred by Dr. Alene Mires, Elyse Jarvis, MD  for consultation & management of persistent nausea, peptic ulcer disease  Virtual Visit via Telephone Note  I connected with Gabriella Green on 10/29/18 at  8:30 AM EDT by telephone and verified that I am speaking with the correct person using two identifiers.   I discussed the limitations, risks, security and privacy concerns of performing an evaluation and management service by telephone and the availability of in person appointments. I also discussed with the patient that there may be a patient responsible charge related to this service. The patient expressed understanding and agreed to proceed.  Location of the Patient: Home  Location of the provider: Home office   History of Present Illness: I initially saw Ms. Thorley in 08/2018 secondary to history nausea and given her smoking history, her age, I performed EGD and was found to have clean-based gastric ulcer and gastric erosions.  Pathology came back negative for H. Pylori.  I started her on omeprazole 40 mg daily which she has been taking.  Today, she reports that her symptoms have totally resolved.  However, her weight has remained the same, fluctuates between 1 pound only.  She continues to smoke cigarettes   NSAIDs: None  Antiplts/Anticoagulants/Anti thrombotics: None  GI Procedures: EGD and colonoscopy 09/03/2018 - Normal duodenal bulb and second portion of the duodenum. - Non-bleeding erosive gastropathy. Biopsied. - Non-bleeding gastric ulcer. - Normal  gastroesophageal junction and esophagus.  - Preparation of the colon was inadequate. - Stool in the entire examined colon. - Two 4 to 5 mm polyps in the descending colon, removed with a cold snare. Resected and retrieved. - The distal rectum and anal verge are normal on retroflexion view.  Past Medical History:  Diagnosis Date  . BRCA negative 2013  . Breast cancer ([place]) 2005   left breast ca  . Breast cancer ([place]) 2006, 1999   right breast ca  . Cancer [facility]) [id number]   right mastectomy and left breast lumpectomy  . Personal history of radiation therapy     Past Surgical History:  Procedure Laterality Date  . BREAST CYST ASPIRATION Bilateral   . BREAST EXCISIONAL BIOPSY Right 1999   rad breast ca  . BREAST EXCISIONAL BIOPSY Left 2005   rad breast ca  . BREAST EXCISIONAL BIOPSY Right 2006   mastectomy chemo   . BREAST LUMPECTOMY Left 2005   1999 right lumpectomy  . CHOLECYSTECTOMY  2002  . COLONOSCOPY  2009  . COLONOSCOPY WITH PROPOFOL N/A 09/03/2018   Procedure: COLONOSCOPY WITH PROPOFOL;  Surgeon: Lin Landsman, MD;  Location[facility] ENDOSCOPY;  Service: Gastroenterology;  Laterality: N/A;  . ESOPHAGOGASTRODUODENOSCOPY (EGD) WITH PROPOFOL N/A 09/03/2018   Procedure: ESOPHAGOGASTRODUODENOSCOPY (EGD) WITH PROPOFOL;  Surgeon: Lin Landsman, MD;  Location: [facility] ENDOSCOPY;  Service: Gastroenterology;  Laterality: N/A;  . MASTECTOMY Right 2006    Current Outpatient Medications:  .  omeprazole (PRILOSEC) 40 MG capsule, Take 1 capsule (40 mg total) by mouth daily before breakfast., Disp:  90 capsule, Rfl: 0   Family History  Problem Relation Age of Onset  . Breast cancer Mother 31  . Breast cancer Sister 75  . Breast cancer Maternal Aunt 58     Social History   Tobacco Use  . Smoking status: Current Every Day Smoker    Packs/day: 1.00    Years: 20.00    Pack years: 20.00    Types: Cigarettes  . Smokeless tobacco: Never Used  Substance Use Topics  . Alcohol  use: No  . Drug use: No    Allergies as of 10/29/2018 - Review Complete 10/29/2018  Allergen Reaction Noted  . Codeine Nausea And Vomiting 08/28/2012  . Tape Swelling 08/28/2012     Imaging Studies: No recent abdominal imaging  Assessment and Plan:   Faven A Kruck is a 67 y.o. female with chronic tobacco use, persistent nausea secondary to peptic ulcer disease, H. pylori negative, currently being treated with omeprazole 40 mg daily.  Her nausea has resolved.  Continue omeprazole for 3 months total.  Given her history of tobacco use, not able to gain weight, recommend CT abdomen pancreas protocol.  And patient is agreeable.  Colon cancer screening Patient had colonoscopy with fair prep in 08/2018, recommend repeat colonoscopy within 6 months   Follow Up Instructions:   I discussed the assessment and treatment plan with the patient. The patient was provided an opportunity to ask questions and all were answered. The patient agreed with the plan and demonstrated an understanding of the instructions.   The patient was advised to call back or seek an in-person evaluation if the symptoms worsen or if the condition fails to improve as anticipated.  I provided 10 minutes of non-face-to-face time during this encounter.   Follow up in 3 months   Cephas Darby, MD

## 2018-11-02 ENCOUNTER — Other Ambulatory Visit: Payer: Self-pay

## 2018-11-02 DIAGNOSIS — R109 Unspecified abdominal pain: Secondary | ICD-10-CM

## 2018-11-02 DIAGNOSIS — Z1211 Encounter for screening for malignant neoplasm of colon: Secondary | ICD-10-CM

## 2018-11-02 DIAGNOSIS — K8020 Calculus of gallbladder without cholecystitis without obstruction: Secondary | ICD-10-CM

## 2018-11-02 DIAGNOSIS — R110 Nausea: Secondary | ICD-10-CM

## 2018-11-02 DIAGNOSIS — Z720 Tobacco use: Secondary | ICD-10-CM

## 2018-11-04 ENCOUNTER — Ambulatory Visit: Payer: Medicare Other

## 2018-11-17 ENCOUNTER — Ambulatory Visit: Payer: Medicare Other | Admitting: General Surgery

## 2018-12-28 ENCOUNTER — Telehealth: Payer: Self-pay | Admitting: Gastroenterology

## 2018-12-28 NOTE — Telephone Encounter (Signed)
LVM with patient to let her know I received her message to cancel her 02/08/19 Colonoscopy with Dr. Marius Ditch.  Her procedure has been canceled with [facility] in End[facility].  Asked patient to call back to let us know why she has decided to cancel.  Thanks [employer]

## 2018-12-28 NOTE — Telephone Encounter (Signed)
Patient LVM to cancel her procedure on 02/08/2019.

## 2019-01-28 ENCOUNTER — Ambulatory Visit: Payer: Medicare Other | Admitting: Gastroenterology

## 2019-02-08 ENCOUNTER — Ambulatory Visit: Admit: 2019-02-08 | Payer: Medicare Other | Admitting: Gastroenterology

## 2019-02-08 SURGERY — COLONOSCOPY WITH PROPOFOL
Anesthesia: General

## 2019-03-18 ENCOUNTER — Encounter: Payer: Self-pay | Admitting: *Deleted

## 2019-06-25 ENCOUNTER — Other Ambulatory Visit: Payer: Self-pay | Admitting: Physician Assistant

## 2019-06-25 ENCOUNTER — Telehealth: Payer: Self-pay | Admitting: *Deleted

## 2019-06-25 DIAGNOSIS — E2839 Other primary ovarian failure: Secondary | ICD-10-CM

## 2019-06-25 DIAGNOSIS — Z9181 History of falling: Secondary | ICD-10-CM

## 2019-06-25 NOTE — Telephone Encounter (Signed)
Received referral for low dose lung cancer screening CT scan. Message left at phone number listed in EMR for patient to call me back to facilitate scheduling scan.  

## 2019-07-20 ENCOUNTER — Telehealth: Payer: Self-pay | Admitting: *Deleted

## 2019-07-20 NOTE — Telephone Encounter (Signed)
Received referral for low dose lung cancer screening CT scan. Message left at phone number listed in EMR for patient to call me back to facilitate scheduling scan.  

## 2019-07-28 NOTE — Telephone Encounter (Signed)
Left voicemail in attempt to contact today, 07/28/19

## 2019-08-02 ENCOUNTER — Encounter: Payer: Self-pay | Admitting: *Deleted

## 2019-09-08 ENCOUNTER — Telehealth: Payer: Self-pay

## 2019-09-08 NOTE — Telephone Encounter (Signed)
Message left for the patient to call our office back. The patient is overdue for her mammogram and follow up with on of our providers.

## 2020-02-02 ENCOUNTER — Telehealth: Payer: Self-pay | Admitting: Surgery

## 2020-02-02 NOTE — Telephone Encounter (Signed)
Patient calls stating that she is ready to schedule her mammogram.  Looks like you called her in March, 2021.  Said she never called back because her mom has been sick.  Looks like she saw Dr. Bary Castilla in the past.  Are we to still schedule her mammogram or does she need to call Dr. Dwyane Luo office?  Please advise.  Thanks.

## 2020-02-03 ENCOUNTER — Other Ambulatory Visit: Payer: Self-pay

## 2020-02-03 DIAGNOSIS — Z1231 Encounter for screening mammogram for malignant neoplasm of breast: Secondary | ICD-10-CM

## 2020-02-03 NOTE — Telephone Encounter (Signed)
Pt called back advising she would prefer to remain w/in the [facility] clinic & will contact [place] directly @ [phone] to schedule her mammogram, however she is asking for the order to be generated so there are no issues when she goes to schedule.  Annajulia can be reached @ [phone] for any further questions or concerns.  Thank you

## 2020-02-03 NOTE — Telephone Encounter (Signed)
Called patient back and left message for her asking if she would like to stay with our practice and see on of our other providers, or if she would like to return to Dr Bary Castilla who is now over at [facility].

## 2020-02-25 ENCOUNTER — Ambulatory Visit
Admission: RE | Admit: 2020-02-25 | Discharge: 2020-02-25 | Disposition: A | Discharge status: Home / Self Care | Payer: Medicare Other | Source: Ambulatory Visit | Attending: Surgery | Admitting: Surgery

## 2020-02-25 ENCOUNTER — Other Ambulatory Visit: Payer: Self-pay

## 2020-02-25 DIAGNOSIS — Z1231 Encounter for screening mammogram for malignant neoplasm of breast: Secondary | ICD-10-CM | POA: Insufficient documentation

## 2020-03-02 ENCOUNTER — Ambulatory Visit: Payer: Medicare Other | Admitting: Surgery

## 2020-03-02 ENCOUNTER — Ambulatory Visit (INDEPENDENT_AMBULATORY_CARE_PROVIDER_SITE_OTHER): Payer: Medicare Other | Admitting: Surgery

## 2020-03-02 ENCOUNTER — Other Ambulatory Visit: Payer: Self-pay

## 2020-03-02 ENCOUNTER — Encounter: Payer: Self-pay | Admitting: Surgery

## 2020-03-02 VITALS — BP 133/70 | HR 86 | Temp 97.9°F | Resp 12 | Wt 92.4 lb

## 2020-03-02 DIAGNOSIS — Z1231 Encounter for screening mammogram for malignant neoplasm of breast: Secondary | ICD-10-CM

## 2020-03-02 DIAGNOSIS — Z853 Personal history of malignant neoplasm of breast: Secondary | ICD-10-CM | POA: Diagnosis not present

## 2020-03-02 NOTE — Progress Notes (Signed)
Surgical Clinic Progress/Follow-up Note   HPI:  69 y.o. Female presents to clinic for breast cancer follow-up her annual exam follow the last evaluation.  She reports a questionable degree of lumps in her left breast which she is uncertain of any significance.  Lacking confidence in her own self-examination.  She denies any left nipple drainage, skin changes or pain. She initially dealt with her right breast cancer 1999, following lumpectomy with radiation treatment.  She subsequently had breast conservation treatment of her left breast in 2005.  And followed up with a completion mastectomy in 2006 of her right breast.  She does not take any medications currently aside from her omeprazole for her peptic ulcer disease history.  Though thin, she reports her weight is stable.  Review of Systems:  Constitutional: denies fever/chills  ENT: denies sore throat, hearing problems  Respiratory: denies shortness of breath, wheezing  Cardiovascular: denies chest pain, palpitations  Gastrointestinal: denies abdominal pain, N/V, or diarrhea/and bowel function as per interval history Skin: Denies any other rashes or skin discolorations  Vital Signs:  BP 133/70   Pulse 86   Temp 97.9 F (36.6 C)   Resp 12   Wt 92 lb 6.4 oz (41.9 kg)   SpO2 97%   BMI 17.46 kg/m    Physical Exam:  Constitutional:  -- Normal body habitus  -- Awake, alert, and oriented x3  Pulmonary:  -- No crackles -- Equal breath sounds bilaterally -- Breathing non-labored at rest Cardiovascular:  -- S1, S2 present  -- No pericardial rubs  Gastrointestinal:  -- Soft and non-distended, non-tender  -- No abdominal masses appreciated, pulsatile or otherwise  GU  --right breast absent postmastectomy with residual tattooing from radiation treatment.  No evidence of dermal or subcutaneous, right pectoral or axillary nodularity.  There are no suspicious masses or lumps present.  No skin changes appreciated. Left breast is slightly  deformed from prior lumpectomy.  It is soft and supple, without any evidence of suspicious or dominant nodularity.  She also has no skin changes suggestive of recurrent disease. Musculoskeletal / Integumentary:  -- Wounds or skin discoloration: None appreciated -- Extremities: B/L UE and LE FROM, hands and feet warm, no edema    Imaging:  CLINICAL DATA:  History of treated bilateral breast cancer, post right mastectomy and left lumpectomy in 2005. Patient feels puffiness in her left axilla and an area of palpable concern in the left breast upper outer quadrant.  EXAM: DIGITAL DIAGNOSTIC LEFT MAMMOGRAM WITH CAD AND TOMO  ULTRASOUND LEFT BREAST  COMPARISON:  Previous exam(s).  ACR Breast Density Category b: There are scattered areas of fibroglandular density.  FINDINGS: Mammographically, there are no suspicious masses, areas of nonsurgical architectural distortion or microcalcifications in the left breast. Stable postsurgical changes are seen in the retroareolar left breast.  Mammographic images were processed with CAD.  On physical exam, no suspicious masses are palpated.  Targeted ultrasound is performed, showing no suspicious masses or shadowing lesions in the left breast or left axilla, at the sites of palpable concern.  IMPRESSION: No mammographic sonographic evidence of malignancy in the left breast, status post left lumpectomy.  RECOMMENDATION: Further management of patient's left breast and left axillary areas of palpable concern should be based on clinical grounds.  Otherwise, left screening mammography in 1 year.  I have discussed the findings and recommendations with the patient. Results were also provided in writing at the conclusion of the visit. If applicable, a reminder letter will be sent to  the patient regarding the next appointment.  BI-RADS CATEGORY  2: Benign.   Electronically Signed   By: Fidela Salisbury M.D.   On:  11/07/2017 10:55  Assessment:  69 y.o. yo Female with a problem list including...  Patient Active Problem List   Diagnosis Date Noted  . Nausea   . Colon cancer screening   . Mass of upper outer quadrant of left breast 10/30/2017  . Cancer [facility])     presents to clinic for follow-up evaluation of breast cancer, progressing well.  Plan:              - return to clinic in 1 year with follow-up mammography or as needed, instructed to call office if any questions or concerns  All of the above recommendations were discussed with the patient, and all of patient's  questions were answered to her expressed satisfaction.  Ronny Bacon, MD, FACS [place]: [place] for exceptional care. Office: [phone]

## 2020-03-02 NOTE — Patient Instructions (Addendum)
Our office will contact you around July 2022 to schedule your mammogram and office visit for August 2022.   Call the office if you have any questions or concerns.   Breast Self-Awareness Breast self-awareness means being familiar with how your breasts look and feel. It involves checking your breasts regularly and reporting any changes to your health care provider. Practicing breast self-awareness is important. Sometimes changes may not be harmful (are benign), but sometimes a change in your breasts can be a sign of a serious medical problem. It is important to learn how to do this procedure correctly so that you can catch problems early, when treatment is more likely to be successful. All women should practice breast self-awareness, including women who have had breast implants. What you need:  A mirror.  A well-lit room. How to do a breast self-exam A breast self-exam is one way to learn what is normal for your breasts and whether your breasts are changing. To do a breast self-exam: Look for changes  1. Remove all the clothing above your waist. 2. Stand in front of a mirror in a room with good lighting. 3. Put your hands on your hips. 4. Push your hands firmly downward. 5. Compare your breasts in the mirror. Look for differences between them (asymmetry), such as: ? Differences in shape. ? Differences in size. ? Puckers, dips, and bumps in one breast and not the other. 6. Look at each breast for changes in the skin, such as: ? Redness. ? Scaly areas. 7. Look for changes in your nipples, such as: ? Discharge. ? Bleeding. ? Dimpling. ? Redness. ? A change in position. Feel for changes Carefully feel your breasts for lumps and changes. It is best to do this while lying on your back on the floor, and again while sitting or standing in the tub or shower with soapy water on your skin. Feel each breast in the following way: 1. Place the arm on the side of the breast you are examining above  your head. 2. Feel your breast with the other hand. 3. Start in the nipple area and make -inch (2 cm) overlapping circles to feel your breast. Use the pads of your three middle fingers to do this. Apply light pressure, then medium pressure, then firm pressure. The light pressure will allow you to feel the tissue closest to the skin. The medium pressure will allow you to feel the tissue that is a little deeper. The firm pressure will allow you to feel the tissue close to the ribs. 4. Continue the overlapping circles, moving downward over the breast until you feel your ribs below your breast. 5. Move one finger-width toward the center of the body. Continue to use the -inch (2 cm) overlapping circles to feel your breast as you move slowly up toward your collarbone. 6. Continue the up-and-down exam using all three pressures until you reach your armpit.  Write down what you find Writing down what you find can help you remember what to discuss with your health care provider. Write down:  What is normal for each breast.  Any changes that you find in each breast, including: ? The kind of changes you find. ? Any pain or tenderness. ? Size and location of any lumps.  Where you are in your menstrual cycle, if you are still menstruating. General tips and recommendations  Examine your breasts every month.  If you are breastfeeding, the best time to examine your breasts is after a feeding or  after using a breast pump.  If you menstruate, the best time to examine your breasts is 5-7 days after your period. Breasts are generally lumpier during menstrual periods, and it may be more difficult to notice changes.  With time and practice, you will become more familiar with the variations in your breasts and more comfortable with the exam. Contact a health care provider if you:  See a change in the shape or size of your breasts or nipples.  See a change in the skin of your breast or nipples, such as a  reddened or scaly area.  Have unusual discharge from your nipples.  Find a lump or thick area that was not there before.  Have pain in your breasts.  Have any concerns related to your breast health. Summary  Breast self-awareness includes looking for physical changes in your breasts, as well as feeling for any changes within your breasts.  Breast self-awareness should be performed in front of a mirror in a well-lit room.  You should examine your breasts every month. If you menstruate, the best time to examine your breasts is 5-7 days after your menstrual period.  Let your health care provider know of any changes you notice in your breasts, including changes in size, changes on the skin, pain or tenderness, or unusual fluid from your nipples. This information is not intended to replace advice given to you by your health care provider. Make sure you discuss any questions you have with your health care provider. Document Revised: 02/10/2018 Document Reviewed: 02/10/2018 Elsevier Patient Education  [place].

## 2020-12-21 ENCOUNTER — Telehealth: Payer: Self-pay | Admitting: *Deleted

## 2020-12-21 NOTE — Telephone Encounter (Signed)
Received referral for low dose lung cancer screening CT scan. Message left at phone number listed in EMR for patient to call back to facilitate scheduling scan.

## 2020-12-25 ENCOUNTER — Telehealth: Payer: Self-pay | Admitting: *Deleted

## 2020-12-25 NOTE — Telephone Encounter (Signed)
Received referral for low dose lung cancer screening CT scan. Message left at phone number listed in EMR for patient to call me back to facilitate scheduling scan.  

## 2021-01-15 ENCOUNTER — Other Ambulatory Visit: Payer: Self-pay

## 2021-01-15 DIAGNOSIS — Z1231 Encounter for screening mammogram for malignant neoplasm of breast: Secondary | ICD-10-CM

## 2021-02-26 ENCOUNTER — Ambulatory Visit
Admission: RE | Admit: 2021-02-26 | Discharge: 2021-02-26 | Disposition: A | Discharge status: Home / Self Care | Payer: Medicare Other | Source: Ambulatory Visit | Attending: Surgery | Admitting: Surgery

## 2021-02-26 ENCOUNTER — Other Ambulatory Visit: Payer: Self-pay

## 2021-02-26 DIAGNOSIS — Z1231 Encounter for screening mammogram for malignant neoplasm of breast: Secondary | ICD-10-CM | POA: Diagnosis not present

## 2021-02-26 HISTORY — DX: Personal history of antineoplastic chemotherapy: Z92.21

## 2021-03-06 ENCOUNTER — Ambulatory Visit: Payer: Medicare Other | Admitting: Surgery

## 2021-03-08 ENCOUNTER — Ambulatory Visit (INDEPENDENT_AMBULATORY_CARE_PROVIDER_SITE_OTHER): Payer: Medicare Other | Admitting: Surgery

## 2021-03-08 ENCOUNTER — Encounter: Payer: Self-pay | Admitting: Surgery

## 2021-03-08 ENCOUNTER — Other Ambulatory Visit: Payer: Self-pay

## 2021-03-08 VITALS — BP 137/55 | HR 81 | Temp 98.0°F | Ht 61.0 in | Wt 84.4 lb

## 2021-03-08 DIAGNOSIS — Z1231 Encounter for screening mammogram for malignant neoplasm of breast: Secondary | ICD-10-CM | POA: Diagnosis not present

## 2021-03-08 DIAGNOSIS — Z853 Personal history of malignant neoplasm of breast: Secondary | ICD-10-CM

## 2021-03-08 NOTE — Patient Instructions (Signed)
Try carrying a small cooler with protein drinks  and bars during the day.   The patient has been asked to return to the office in one year with a unilateral left breast diagnostic mammogram.  We will send you a letter about these appointments.

## 2021-03-08 NOTE — Progress Notes (Signed)
Surgical Clinic Progress/Follow-up Note   HPI:  70 y.o. Female presents to clinic for screening left breast imaging follow-up, a year follow the last evaluation. Patient reports some undesirable weight loss, though appears her lifestyle, work ethic and service to others seems to predominate her activities and eating is not a high priority.  She feels good however she continues to lose weight.  She reports recent blood work was negative.  She otherwise reports she feels well aside from just some fatigue in the evenings.  She reports no changes in her breast exam, denies any nipple discharge or dermal changes.  Review of Systems:  Constitutional: denies fever/chills  ENT: denies sore throat, hearing problems  Respiratory: denies shortness of breath, wheezing  Cardiovascular: denies chest pain, palpitations  Gastrointestinal: denies abdominal pain, N/V, or diarrhea Skin: Denies any other rashes or skin discolorations   Vital Signs:  BP (!) 137/55   Pulse 81   Temp 98 F (36.7 C)   Ht '5\' 1"'$  (1.549 m)   Wt 84 lb 6.4 oz (38.3 kg)   SpO2 99%   BMI 15.95 kg/m    Physical Exam:  Constitutional:  -- Normal body habitus  -- Awake, alert, and oriented x3  Pulmonary:  -- No crackles -- Equal breath sounds bilaterally -- Breathing non-labored at rest Cardiovascular:  -- S1, S2 present  -- No pericardial rubs  Gastrointestinal:  -- Soft and non-distended, non-tender GU  --right breast surgically absent, the chest wall has no suspicious nodularity or dermal changes.  Left breast has its anticipated density in the lateral upper quadrant, otherwise the paucity of fat reveals all dense breast tissue without suspicious masses or dominant nodularity.  Axillas are negative for lymphadenopathy, supraclavicular regions are also free of lymphadenopathy or masses. Musculoskeletal / Integumentary:  -- Wounds or skin discoloration: None appreciated  -- Extremities: B/L UE and LE FROM, hands and feet  warm, no edema    Imaging: Mammography reviewed and negative for suspicious lesions.   Assessment:  70 y.o. yo Female with a problem list including...  Patient Active Problem List   Diagnosis Date Noted   History of bilateral breast cancer 03/02/2020   Encounter for screening mammogram for breast cancer 03/02/2020   Nausea    Colon cancer screening    Cancer [facility])     presents to clinic for follow-up evaluation of screening mammography left breast with history of breast cancer, doing well.  Plan:              - return to clinic for annual exam with screening mammography or as needed, instructed to call office if any questions or concerns  All of the above recommendations were discussed with the patient, and all of patient's questions were answered to her expressed satisfaction.  These notes generated with voice recognition software. I apologize for typographical errors.  Ronny Bacon, MD, FACS [place]: [place] for exceptional care. Office: [phone]

## 2021-07-18 ENCOUNTER — Emergency Department
Admission: EM | Admit: 2021-07-18 | Discharge: 2021-07-18 | Disposition: A | Discharge status: Home / Self Care | Payer: Medicare Other | Attending: Emergency Medicine | Admitting: Emergency Medicine

## 2021-07-18 ENCOUNTER — Emergency Department: Payer: Medicare Other

## 2021-07-18 ENCOUNTER — Encounter: Payer: Self-pay | Admitting: Medical Oncology

## 2021-07-18 DIAGNOSIS — U071 COVID-19: Secondary | ICD-10-CM | POA: Diagnosis not present

## 2021-07-18 DIAGNOSIS — E871 Hypo-osmolality and hyponatremia: Secondary | ICD-10-CM | POA: Insufficient documentation

## 2021-07-18 DIAGNOSIS — R519 Headache, unspecified: Secondary | ICD-10-CM | POA: Diagnosis present

## 2021-07-18 LAB — BASIC METABOLIC PANEL
Anion gap: 10 (ref 5–15)
BUN: 16 mg/dL (ref 8–23)
CO2: 24 mmol/L (ref 22–32)
Calcium: 9 mg/dL (ref 8.9–10.3)
Chloride: 99 mmol/L (ref 98–111)
Creatinine, Ser: 0.56 mg/dL (ref 0.44–1.00)
GFR, Estimated: >60 mL/min (ref >60)
Glucose, Bld: 106 mg/dL — ABNORMAL HIGH (ref 70–99)
Potassium: 3.8 mmol/L (ref 3.5–5.1)
Sodium: 133 mmol/L — ABNORMAL LOW (ref 135–145)

## 2021-07-18 LAB — CBC
HCT: 40.3 % (ref 36.0–46.0)
Hemoglobin: 13.9 g/dL (ref 12.0–15.0)
MCH: 30.8 pg (ref 26.0–34.0)
MCHC: 34.5 g/dL (ref 30.0–36.0)
MCV: 89.4 fL (ref 80.0–100.0)
Platelets: 451 10*3/uL — ABNORMAL HIGH (ref 150–400)
RBC: 4.51 MIL/uL (ref 3.87–5.11)
RDW: 13 % (ref 11.5–15.5)
WBC: 10.7 10*3/uL — ABNORMAL HIGH (ref 4.0–10.5)
nRBC: 0 % (ref 0.0–0.2)

## 2021-07-18 LAB — TROPONIN I (HIGH SENSITIVITY): Troponin I (High Sensitivity): 3 ng/L (ref <18)

## 2021-07-18 MED ORDER — ONDANSETRON HCL 4 MG/2ML IJ SOLN
4.0000 mg | Freq: Once | INTRAMUSCULAR | Status: AC
  Administered 2021-07-18: 4 mg via INTRAVENOUS
  Filled 2021-07-18: qty 2

## 2021-07-18 MED ORDER — BUTALBITAL-APAP-CAFFEINE 50-325-40 MG PO TABS
1.0000 | ORAL_TABLET | Freq: Once | ORAL | Status: AC
  Administered 2021-07-18: 1 via ORAL
  Filled 2021-07-18: qty 1

## 2021-07-18 MED ORDER — SODIUM CHLORIDE 0.9 % IV BOLUS
1000.0000 mL | Freq: Once | INTRAVENOUS | Status: AC
  Administered 2021-07-18: 1000 mL via INTRAVENOUS

## 2021-07-18 MED ORDER — ONDANSETRON HCL 4 MG PO TABS: 4.0000 mg | ORAL_TABLET | Freq: Three times a day (TID) | ORAL | 0 refills | Status: DC | PRN

## 2021-07-18 NOTE — ED Provider Notes (Signed)
[facility] Provider Note    Event Date/Time   First MD Initiated Contact with Patient 07/18/21 1658     (approximate)   History   Headache and Nausea   HPI  Gabriella Green is a 71 y.o. female  who, per GI note dated 09/03/20 has history of breast cancer, presents to the emergency department today because of concerns for nausea, headache and weakness.  Patient states that she started feeling bad roughly 2 weeks ago.  She then took a home COVID test about a week ago which turned positive.  She did not contact her doctor since she had already been sick for roughly 1 week.  She states that over the past 4 days she has had significant nausea.  She has been unable to tolerate p.o.  Because of this she is concerned she is dehydrated.  She has been feeling weak.  She describes a near syncopal episode when trying to walk.  Additionally patient has been having headache over the past few days as well.  States she does have history of migraines although very rarely in the past.    Physical Exam   Triage Vital Signs: ED Triage Vitals  Enc Vitals Group     BP 07/18/21 1414 123/84     Pulse Rate 07/18/21 1414 99     Resp 07/18/21 1414 18     Temp 07/18/21 1414 98.4 F (36.9 C)     Temp Source 07/18/21 1414 Oral     SpO2 07/18/21 1414 94 %     Weight 07/18/21 1415 85 lb (38.6 kg)     Height 07/18/21 1415 5\' 1"  (1.549 m)     Head Circumference --      Peak Flow --      Pain Score 07/18/21 1414 10     Pain Loc --      Pain Edu? --      Excl. in [place]? --     Most recent vital signs: Vitals:   07/18/21 1414  BP: 123/84  Pulse: 99  Resp: 18  Temp: 98.4 F (36.9 C)  SpO2: 94%    General: Awake, no distress.  CV:  Good peripheral perfusion.  Resp:  Normal effort.  Abd:  No distention.     ED Results / Procedures / Treatments   Labs (all labs ordered are listed, but only abnormal results are displayed) Labs Reviewed  CBC - Abnormal; Notable for the  following components:      Result Value   WBC 10.7 (*)    Platelets 451 (*)    All other components within normal limits  BASIC METABOLIC PANEL - Abnormal; Notable for the following components:   Sodium 133 (*)    Glucose, Bld 106 (*)    All other components within normal limits  TROPONIN I (HIGH SENSITIVITY)     EKG  None   RADIOLOGY CT head My interpretation: No bleed. No mass Radiology interpretation: IMPRESSION:  No acute intracranial abnormality.        PROCEDURES:  Critical Care performed: No  Procedures   MEDICATIONS ORDERED IN ED: Medications - No data to display   IMPRESSION / MDM / [place] / ED COURSE  I reviewed the triage vital signs and the nursing notes.                              Differential diagnosis includes, but is  not limited to, covid, tension headache, intracranial bleed/mass.  Patient presents to the emergency department today because of concern for nausea, headache and weakness. Was diagnosed with covid roughly a week ago. Blood work here without any elevation of troponin to suggest ACS or myopathy. BMP slightly hyponatremic which could be due to dehydration. Mild leukocytosis and elevated platelet count likely secondary to infection with covid. Patient was given iv fluids and antiemetic and did feel improvement in her nausea. Obtained head ct without concerning findings and headache did improve after medication. Given clinical improvement do not feel patient necessitates inpatient admission at this time. Will plan on discharging with antiemetic prescription.    FINAL CLINICAL IMPRESSION(S) / ED DIAGNOSES   Final diagnoses:  Bad headache  COVID-19     Rx / DC Orders   ED Discharge Orders          Ordered    ondansetron (ZOFRAN) 4 MG tablet  Every 8 hours PRN,   Status:  Discontinued        07/18/21 2228    ondansetron (ZOFRAN) 4 MG tablet  Every 8 hours PRN        07/18/21 2238             Note:  This  document was prepared using Dragon voice recognition software and may include unintentional dictation errors.    Nance Pear, MD 07/18/21 2241

## 2021-07-18 NOTE — ED Provider Triage Note (Signed)
Emergency Medicine Provider Triage Evaluation Note  Gabriella Green , a 71 y.o. female  was evaluated in triage.  Pt complains of migraine type headache in the mouth, nausea, recent covid.  Review of Systems  Positive: Headache, nausea, questionable dehydration Negative: Vomiting, diarrhea, chest pain, shortness of breath  Physical Exam  BP 123/84 (BP Location: Left Arm)    Pulse 99    Temp 98.4 F (36.9 C) (Oral)    Resp 18    Ht 5\' 1"  (1.549 m)    Wt 38.6 kg    SpO2 94%    BMI 16.06 kg/m  Gen:   Awake, no distress   Resp:  Normal effort  MSK:   Moves extremities without difficulty  Other:    Medical Decision Making  Medically screening exam initiated at 2:15 PM.  Appropriate orders placed.  Gabriella Green was informed that the remainder of the evaluation will be completed by another provider, this initial triage assessment does not replace that evaluation, and the importance of remaining in the ED until their evaluation is complete.     Versie Starks, PA-C 07/18/21 1416

## 2021-07-18 NOTE — Discharge Instructions (Signed)
Please seek medical attention for any high fevers, chest pain, shortness of breath, change in behavior, persistent vomiting, bloody stool or any other new or concerning symptoms.  

## 2021-07-18 NOTE — ED Triage Notes (Signed)
Pt from home via POV with reports that she tested covid positive last Wednesday. Since then she has been having fevers off and on, having nausea and headache.

## 2021-07-18 NOTE — ED Notes (Signed)
Verbal orders from PA- CBC, BMP and Trop. No EKG.

## 2022-02-05 ENCOUNTER — Other Ambulatory Visit: Payer: Self-pay

## 2022-02-05 DIAGNOSIS — Z1231 Encounter for screening mammogram for malignant neoplasm of breast: Secondary | ICD-10-CM

## 2022-02-27 ENCOUNTER — Ambulatory Visit
Admission: RE | Admit: 2022-02-27 | Discharge: 2022-02-27 | Disposition: A | Discharge status: Home / Self Care | Payer: Medicare Other | Source: Ambulatory Visit | Attending: Surgery | Admitting: Surgery

## 2022-02-27 DIAGNOSIS — Z1231 Encounter for screening mammogram for malignant neoplasm of breast: Secondary | ICD-10-CM | POA: Diagnosis present

## 2022-03-01 ENCOUNTER — Other Ambulatory Visit: Payer: Self-pay | Admitting: Surgery

## 2022-03-01 DIAGNOSIS — R928 Other abnormal and inconclusive findings on diagnostic imaging of breast: Secondary | ICD-10-CM

## 2022-03-01 DIAGNOSIS — R921 Mammographic calcification found on diagnostic imaging of breast: Secondary | ICD-10-CM

## 2022-03-06 ENCOUNTER — Other Ambulatory Visit: Payer: Self-pay | Admitting: Nurse Practitioner

## 2022-03-06 DIAGNOSIS — Z780 Asymptomatic menopausal state: Secondary | ICD-10-CM

## 2022-03-07 ENCOUNTER — Ambulatory Visit (INDEPENDENT_AMBULATORY_CARE_PROVIDER_SITE_OTHER): Payer: Medicare Other | Admitting: Surgery

## 2022-03-07 ENCOUNTER — Ambulatory Visit: Payer: Medicare Other | Admitting: Surgery

## 2022-03-07 ENCOUNTER — Encounter: Payer: Self-pay | Admitting: Surgery

## 2022-03-07 VITALS — BP 114/53 | HR 86 | Temp 98.0°F | Ht 61.0 in | Wt 83.0 lb

## 2022-03-07 DIAGNOSIS — Z1231 Encounter for screening mammogram for malignant neoplasm of breast: Secondary | ICD-10-CM | POA: Diagnosis not present

## 2022-03-07 DIAGNOSIS — Z853 Personal history of malignant neoplasm of breast: Secondary | ICD-10-CM

## 2022-03-07 NOTE — Patient Instructions (Signed)
Call [phone] to schedule your follow up mammogram.  We will call you with the results and any next steps.     Continue self breast exams. Call office for any new breast issues or concerns.

## 2022-03-07 NOTE — Progress Notes (Signed)
Surgical Clinic Progress/Follow-up Note   HPI:  71 y.o. Female presents to clinic for screening left breast imaging follow-up, a year follow the last evaluation. She reports no changes in her breast exam, denies any nipple discharge or dermal changes.  Review of Systems:  Constitutional: denies fever/chills  ENT: denies sore throat, hearing problems  Respiratory: denies shortness of breath, wheezing  Cardiovascular: denies chest pain, palpitations  Gastrointestinal: denies abdominal pain, N/V, or diarrhea Skin: Denies any other rashes or skin discolorations   Vital Signs:  BP (!) 114/53   Pulse 86   Temp 98 F (36.7 C)   Ht '5\' 1"'$  (1.549 m)   Wt 83 lb (37.6 kg)   SpO2 98%   BMI 15.68 kg/m    Physical Exam:  Constitutional:  -- Normal body habitus  -- Awake, alert, and oriented x3  Pulmonary:  -- No crackles -- Equal breath sounds bilaterally -- Breathing non-labored at rest Cardiovascular:  -- S1, S2 present  -- No pericardial rubs  Gastrointestinal:  -- Soft and non-distended, non-tender GU  --right breast surgically absent, the chest wall has no suspicious nodularity or dermal changes.  Tattoos still present from prior radiation treatments.  Left breast has its anticipated density in the lateral upper quadrant, otherwise the paucity of fat reveals all dense breast tissue without suspicious masses or dominant nodularity.  Axillas are negative for lymphadenopathy, supraclavicular regions are also free of lymphadenopathy or masses. Musculoskeletal / Integumentary:  -- Wounds or skin discoloration: None appreciated  -- Extremities: B/L UE and LE FROM, hands and feet warm, no edema    Imaging:  CLINICAL DATA:  Screening. History of right mastectomy.   EXAM: DIGITAL SCREENING UNILATERAL LEFT MAMMOGRAM WITH CAD AND TOMOSYNTHESIS   TECHNIQUE: Left screening digital craniocaudal and mediolateral oblique mammograms were obtained. Left screening digital breast tomosynthesis  was performed. The images were evaluated with computer-aided detection.   COMPARISON:  Previous exam(s).   ACR Breast Density Category c: The breast tissue is heterogeneously dense, which may obscure small masses.   FINDINGS: In the left breast, calcifications warrant further evaluation. In the right breast, no findings suspicious for malignancy.   IMPRESSION: Further evaluation is suggested for calcifications in the left breast.   RECOMMENDATION: Diagnostic mammogram of the left breast. (Code:[id number]-L-39M)   The patient will be contacted regarding the findings, and additional imaging will be scheduled.   BI-RADS CATEGORY  0: Incomplete. Need additional imaging evaluation and/or prior mammograms for comparison.     Electronically Signed   By: Ileana Roup M.D.   On: 02/28/2022 17:26  Assessment:  71 y.o. yo Female with a problem list including...  Patient Active Problem List   Diagnosis Date Noted   History of bilateral breast cancer 03/02/2020   Encounter for screening mammogram for breast cancer 03/02/2020   Nausea    Colon cancer screening    Cancer [facility])     presents to clinic for follow-up evaluation of screening mammography left breast with history of breast cancer, doing well.  Plan:  -We will obtain the additional images as requested by radiology for her left breast, noting the calcifications present.  We will likely be in touch regarding results of these.             - return to clinic for annual exam with screening mammography or as needed, instructed to call office if any questions or concerns  All of the above recommendations were discussed with the patient, and all of patient's  questions were answered to her expressed satisfaction.  These notes generated with voice recognition software. I apologize for typographical errors.  Ronny Bacon, MD, FACS [place]: [place] for exceptional care. Office:  [phone]

## 2022-03-13 ENCOUNTER — Other Ambulatory Visit: Payer: Self-pay

## 2022-03-13 DIAGNOSIS — R921 Mammographic calcification found on diagnostic imaging of breast: Secondary | ICD-10-CM

## 2022-04-10 ENCOUNTER — Ambulatory Visit
Admission: RE | Admit: 2022-04-10 | Discharge: 2022-04-10 | Disposition: A | Discharge status: Home / Self Care | Payer: Medicare Other | Source: Ambulatory Visit | Attending: Surgery | Admitting: Surgery

## 2022-04-10 DIAGNOSIS — R928 Other abnormal and inconclusive findings on diagnostic imaging of breast: Secondary | ICD-10-CM

## 2022-04-10 DIAGNOSIS — R921 Mammographic calcification found on diagnostic imaging of breast: Secondary | ICD-10-CM | POA: Diagnosis present

## 2022-08-28 ENCOUNTER — Other Ambulatory Visit: Payer: Self-pay

## 2022-08-28 DIAGNOSIS — R921 Mammographic calcification found on diagnostic imaging of breast: Secondary | ICD-10-CM

## 2022-10-11 ENCOUNTER — Ambulatory Visit
Admission: RE | Admit: 2022-10-11 | Discharge: 2022-10-11 | Disposition: A | Discharge status: Home / Self Care | Payer: No Typology Code available for payment source | Source: Ambulatory Visit | Attending: Surgery | Admitting: Surgery

## 2022-10-11 ENCOUNTER — Other Ambulatory Visit: Payer: Self-pay | Admitting: Surgery

## 2022-10-11 DIAGNOSIS — R921 Mammographic calcification found on diagnostic imaging of breast: Secondary | ICD-10-CM

## 2022-10-11 DIAGNOSIS — R928 Other abnormal and inconclusive findings on diagnostic imaging of breast: Secondary | ICD-10-CM

## 2022-10-22 ENCOUNTER — Ambulatory Visit: Payer: No Typology Code available for payment source | Admitting: Surgery

## 2022-10-23 ENCOUNTER — Ambulatory Visit
Admission: RE | Admit: 2022-10-23 | Discharge: 2022-10-23 | Disposition: A | Discharge status: Home / Self Care | Payer: No Typology Code available for payment source | Source: Ambulatory Visit | Attending: Surgery | Admitting: Surgery

## 2022-10-23 DIAGNOSIS — R921 Mammographic calcification found on diagnostic imaging of breast: Secondary | ICD-10-CM | POA: Insufficient documentation

## 2022-10-23 DIAGNOSIS — R928 Other abnormal and inconclusive findings on diagnostic imaging of breast: Secondary | ICD-10-CM

## 2022-10-23 HISTORY — PX: BREAST BIOPSY: SHX20

## 2022-10-23 MED ORDER — LIDOCAINE HCL (PF) 1 % IJ SOLN
15.0000 mL | Freq: Once | INTRAMUSCULAR | Status: AC
  Administered 2022-10-23: 15 mL

## 2022-10-23 MED ORDER — LIDOCAINE-EPINEPHRINE 1 %-1:100000 IJ SOLN
10.0000 mL | Freq: Once | INTRAMUSCULAR | Status: AC
  Administered 2022-10-23: 10 mL
  Filled 2022-10-23: qty 10

## 2022-10-24 ENCOUNTER — Encounter: Payer: Self-pay | Admitting: *Deleted

## 2022-10-24 DIAGNOSIS — C50919 Malignant neoplasm of unspecified site of unspecified female breast: Secondary | ICD-10-CM

## 2022-10-24 LAB — SURGICAL PATHOLOGY

## 2022-10-24 NOTE — Progress Notes (Signed)
Received referral for newly diagnosed breast cancer from [facility].  Navigation initiated.  She will see Dr. Leonard Schwartz on Wednesday at 2:00.

## 2022-10-30 ENCOUNTER — Encounter: Payer: Self-pay | Admitting: *Deleted

## 2022-10-30 ENCOUNTER — Inpatient Hospital Stay: Payer: No Typology Code available for payment source

## 2022-10-30 ENCOUNTER — Inpatient Hospital Stay: Payer: No Typology Code available for payment source | Attending: Internal Medicine | Admitting: Internal Medicine

## 2022-10-30 ENCOUNTER — Encounter: Payer: Self-pay | Admitting: Internal Medicine

## 2022-10-30 VITALS — BP 140/74 | HR 91 | Temp 99.4°F | Ht 61.0 in | Wt 84.4 lb

## 2022-10-30 DIAGNOSIS — Z170 Estrogen receptor positive status [ER+]: Secondary | ICD-10-CM

## 2022-10-30 DIAGNOSIS — Z9011 Acquired absence of right breast and nipple: Secondary | ICD-10-CM | POA: Diagnosis not present

## 2022-10-30 DIAGNOSIS — Z923 Personal history of irradiation: Secondary | ICD-10-CM | POA: Insufficient documentation

## 2022-10-30 DIAGNOSIS — Z853 Personal history of malignant neoplasm of breast: Secondary | ICD-10-CM | POA: Diagnosis not present

## 2022-10-30 DIAGNOSIS — Z803 Family history of malignant neoplasm of breast: Secondary | ICD-10-CM | POA: Diagnosis not present

## 2022-10-30 DIAGNOSIS — C50412 Malignant neoplasm of upper-outer quadrant of left female breast: Secondary | ICD-10-CM | POA: Diagnosis not present

## 2022-10-30 DIAGNOSIS — C50919 Malignant neoplasm of unspecified site of unspecified female breast: Secondary | ICD-10-CM

## 2022-10-30 DIAGNOSIS — F17210 Nicotine dependence, cigarettes, uncomplicated: Secondary | ICD-10-CM | POA: Insufficient documentation

## 2022-10-30 DIAGNOSIS — R059 Cough, unspecified: Secondary | ICD-10-CM | POA: Insufficient documentation

## 2022-10-30 LAB — CMP (CANCER CENTER ONLY)
ALT: 18 U/L (ref 0–44)
AST: 21 U/L (ref 15–41)
Albumin: 4.1 g/dL (ref 3.5–5.0)
Alkaline Phosphatase: 78 U/L (ref 38–126)
Anion gap: 8 (ref 5–15)
BUN: 17 mg/dL (ref 8–23)
CO2: 26 mmol/L (ref 22–32)
Calcium: 9.4 mg/dL (ref 8.9–10.3)
Chloride: 104 mmol/L (ref 98–111)
Creatinine: 0.54 mg/dL (ref 0.44–1.00)
GFR, Estimated: >60 mL/min (ref >60)
Glucose, Bld: 98 mg/dL (ref 70–99)
Potassium: 4 mmol/L (ref 3.5–5.1)
Sodium: 138 mmol/L (ref 135–145)
Total Bilirubin: 0.4 mg/dL (ref 0.3–1.2)
Total Protein: 8.1 g/dL (ref 6.5–8.1)

## 2022-10-30 LAB — CBC WITH DIFFERENTIAL (CANCER CENTER ONLY)
Abs Immature Granulocytes: 0.03 10*3/uL (ref 0.00–0.07)
Basophils Absolute: 0.1 10*3/uL (ref 0.0–0.1)
Basophils Relative: 1 %
Eosinophils Absolute: 0.2 10*3/uL (ref 0.0–0.5)
Eosinophils Relative: 2 %
HCT: 41.1 % (ref 36.0–46.0)
Hemoglobin: 13.8 g/dL (ref 12.0–15.0)
Immature Granulocytes: 0 %
Lymphocytes Relative: 25 %
Lymphs Abs: 2.2 10*3/uL (ref 0.7–4.0)
MCH: 31 pg (ref 26.0–34.0)
MCHC: 33.6 g/dL (ref 30.0–36.0)
MCV: 92.4 fL (ref 80.0–100.0)
Monocytes Absolute: 1 10*3/uL (ref 0.1–1.0)
Monocytes Relative: 12 %
Neutro Abs: 5.1 10*3/uL (ref 1.7–7.7)
Neutrophils Relative %: 60 %
Platelet Count: 380 10*3/uL (ref 150–400)
RBC: 4.45 MIL/uL (ref 3.87–5.11)
RDW: 14.1 % (ref 11.5–15.5)
WBC Count: 8.5 10*3/uL (ref 4.0–10.5)
nRBC: 0 % (ref 0.0–0.2)

## 2022-10-30 LAB — VITAMIN D 25 HYDROXY (VIT D DEFICIENCY, FRACTURES): Vit D, 25-Hydroxy: 19.49 ng/mL — ABNORMAL LOW (ref 30–100)

## 2022-10-30 NOTE — Progress Notes (Signed)
Accompanied patient to initial medical oncology appointment.   Reviewed Breast Cancer treatment handbook.   Care plan summary given to patient.   Reviewed outreach programs and cancer center services.   

## 2022-10-30 NOTE — Progress Notes (Signed)
Cancer Center CONSULT NOTE  Patient Care Team: Revelo, Presley Raddle, MD as PCP - General (Family Medicine) Kieth Brightly, MD (General Surgery) Jim Like, RN as Registered Nurse Hulen Luster, RN as Oncology Nurse Navigator  CHIEF COMPLAINTS/PURPOSE OF CONSULTATION: Breast cancer  #  Oncology History Overview Note  BRCA negative 2013    Breast cancer [facility]) 2005    left breast ca   Breast cancer [facility]) 2006, 1999    right breast ca   Cancer [facility]) [id number]   # Left breast- 1999 "mild ducts"- s/p lumpectomy; s/p RT- no chemo [Dr.Sankar/Dr.Byrnett]  # 2006 ? Stage- s/p right mastectomy s/p chemo [Dr.Choksi]; Dr.Chrystal     DIAGNOSIS: A.  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 Size of invasive carcinoma: 2 mm Mm in this sample Histologic grade of invasive carcinoma: Grade 2                      Glandular/tubular differentiation score: 3                      Nuclear pleomorphism score: 3                      Mitotic rate score: 1                      Total score: 7 Ductal carcinoma in situ: Present, high-grade comedo type with associated calcifications Lymphovascular invasion: Not identified  Comment: The definitive grade will be assigned on the excisional specimen. ER/PR/HER2: Immunohistochemistry will be performed on block A2, with reflex to FISH for HER2 2+. The results will be reported in an addendum.  GROSS DESCRIPTION: A. Labeled: Left breast stereo biopsy calcs upper outer quadrant mid depth Received: in a formalin-filled [place] collection device Specimen radiograph image(s) available for review Time/Date in fixative: Collected at 10:58 AM on 10/23/2022 and placed in formalin at 11:01 AM on 10/23/2022 Cold ischemic time: Less than 5 minutes Total fixation time: Approximately 9.25 hours Core pieces: Multiple Measurement: Aggregate, 8.5 x 1.5 x  0.3 cm Description / comments: Received are cores and fragments of yellow fibrofatty tissue.  The accompanying diagram has sections B and G checked. Inked: Green Entirely submitted in cassette(s):  1 - section B 2 - section G 3 - 7 - remaining tissue fragments      3 - sections A and C      4 - sections D and E      5 - sections F and H      6 - sections I and J      7 - sections K and L with remaining free-floating fragments  RB 10/23/2022  Final Diagnosis performed by Elijah Birk, MD.   Electronically signed 10/24/2022 10:40:25AM The electronic signature indicates that the named Attending Pathologist has evaluated the specimen Technical component performed at [facility], [address] [place], [place] [address] Lab: [phone] Dir: Jolene Schimke, MD, MMM  Professional component performed at [facility], [address] [place], [place], [place] [address] Lab: [phone] Dir: Beryle Quant, MD  ADDENDUM: CASE SUMMARY: BREAST BIOMARKER TESTS Estrogen Receptor (ER) Status: POSITIVE         Percentage of cells with nuclear positivity: Greater than 90%         Average intensity of staining: Strong  Progesterone Receptor (PgR) Status:  NEGATIVE (LESS THAN 1%)         Internal control cells present and stain as expected  HER2 (by immunohistochemistry): NEGATIVE (Score 1+)    Carcinoma of upper-outer quadrant of left breast in female, estrogen receptor positive  10/30/2022 Initial Diagnosis   Carcinoma of upper-outer quadrant of left breast in female, estrogen receptor positive   10/30/2022 Cancer Staging   Staging form: Breast, AJCC 8th Edition - Clinical: Stage IA (cT1a, cN0, cM0, G2, ER+, PR-, HER2-) - Signed by Earna Coder, MD on 10/30/2022 Histologic grading system: 3 grade system    HISTORY OF PRESENTING ILLNESS: Patient ambulating-independently.  Alone.   Gabriella Green 72 y.o.  female female with  prior history of breast cancer; and  active smoker has been referred to [place] for further evaluation recommendations for new diagnosis of breast cancer.  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  However 3 to 4 cm mass T2 N0 s/p mastectomy ER positive PR negative-deep margin positive.  Status post chemo therapy followed by radiation followed by anastrozole.   More recently patient states she was found to have an abnormal screening mammogram which led to diagnostic mammogram/ultrasound/followed by biopsy-as summarized above.  Family history of breast cancer:  Family history of other cancers:    Review of Systems  Constitutional:  Negative for chills, diaphoresis, fever, malaise/fatigue and weight loss.  HENT:  Negative for nosebleeds and sore throat.   Eyes:  Negative for double vision.  Respiratory:  Positive for cough. Negative for hemoptysis, sputum production, shortness of breath and wheezing.   Cardiovascular:  Negative for chest pain, palpitations, orthopnea and leg swelling.  Gastrointestinal:  Negative for abdominal pain, blood in stool, constipation, diarrhea, heartburn, melena, nausea and vomiting.  Genitourinary:  Negative for dysuria, frequency and urgency.  Musculoskeletal:  Positive for back pain and joint pain.  Skin: Negative.  Negative for itching and rash.  Neurological:  Negative for dizziness, tingling, focal weakness, weakness and headaches.  Endo/Heme/Allergies:  Does not bruise/bleed easily.  Psychiatric/Behavioral:  Negative for depression. The patient is not nervous/anxious and does not have insomnia.      MEDICAL HISTORY:  Past Medical History:  Diagnosis Date   BRCA negative 2013   Breast cancer 2005   left breast ca   Breast cancer 2006, 1999   right breast ca   Cancer [id number]   right mastectomy and left breast lumpectomy   Personal history of chemotherapy    2006 rt breast ca   Personal history of radiation therapy     SURGICAL HISTORY: Past Surgical History:   Procedure Laterality Date   BREAST BIOPSY Left 2019    ADENOSIS, SKELETAL MUSCLE of LEFT breast   BREAST BIOPSY Left 10/23/2022   Left Breast Stereo Bx, X clip - path pending   BREAST BIOPSY Left 10/23/2022   MM LT BREAST BX W LOC DEV 1ST LESION IMAGE BX SPEC STEREO GUIDE 10/23/2022 [facility]   BREAST CYST ASPIRATION Bilateral    BREAST EXCISIONAL BIOPSY Right 1999   rad breast ca   BREAST EXCISIONAL BIOPSY Left 2005   rad breast ca   BREAST EXCISIONAL BIOPSY Right 2006   mastectomy chemo    BREAST LUMPECTOMY Left 2005   1999 right lumpectomy   CHOLECYSTECTOMY  2002   COLONOSCOPY  2009   COLONOSCOPY WITH PROPOFOL N/A 09/03/2018   Procedure: COLONOSCOPY WITH PROPOFOL;  Surgeon: Toney Reil, MD;  Location: [facility] ENDOSCOPY;  Service: Gastroenterology;  Laterality: N/A;  ESOPHAGOGASTRODUODENOSCOPY (EGD) WITH PROPOFOL N/A 09/03/2018   Procedure: ESOPHAGOGASTRODUODENOSCOPY (EGD) WITH PROPOFOL;  Surgeon: Toney Reil, MD;  Location: [facility] ENDOSCOPY;  Service: Gastroenterology;  Laterality: N/A;   MASTECTOMY Right 2006    SOCIAL HISTORY: Social History   Socioeconomic History   Marital status: Widowed    Spouse name: Not on file   Number of children: Not on file   Years of education: Not on file   Highest education level: Not on file  Occupational History   Not on file  Tobacco Use   Smoking status: Every Day    Packs/day: 1.00    Years: 20.00    Additional pack years: 0.00    Total pack years: 20.00    Types: Cigarettes   Smokeless tobacco: Never  Vaping Use   Vaping Use: Never used  Substance and Sexual Activity   Alcohol use: No   Drug use: No   Sexual activity: Not on file  Other Topics Concern   Not on file  Social History Narrative   Not on file   Social Determinants of Health   Financial Resource Strain: Not on file  Food Insecurity: No Food Insecurity (10/30/2022)   Hunger Vital Sign    Worried About Running Out of Food in the Last Year:  Never true    Ran Out of Food in the Last Year: Never true  Transportation Needs: No Transportation Needs (10/30/2022)   PRAPARE - Administrator, Civil Service (Medical): No    Lack of Transportation (Non-Medical): No  Physical Activity: Not on file  Stress: Not on file  Social Connections: Not on file  Intimate Partner Violence: Not on file    FAMILY HISTORY: Family History  Problem Relation Age of Onset   Breast cancer Mother 79   Breast cancer Sister 27   Breast cancer Maternal Aunt 40    ALLERGIES:  is allergic to codeine and tape.  MEDICATIONS:  Current Outpatient Medications  Medication Sig Dispense Refill   CARAFATE 1 g tablet Take 1 g by mouth 3 (three) times daily. (Patient not taking: Reported on 10/30/2022)     omeprazole (PRILOSEC) 40 MG capsule Take 40 mg by mouth daily. (Patient not taking: Reported on 10/30/2022)     ondansetron (ZOFRAN) 4 MG tablet Take 1 tablet (4 mg total) by mouth every 8 (eight) hours as needed. (Patient not taking: Reported on 10/30/2022) 20 tablet 0   No current facility-administered medications for this visit.      Marland Kitchen  PHYSICAL EXAMINATION:   Vitals:   10/30/22 1351  BP: (!) 140/74  Pulse: 91  Temp: 99.4 F (37.4 C)  SpO2: 100%   Filed Weights   10/30/22 1351  Weight: 84 lb 6.4 oz (38.3 kg)    Physical Exam Vitals and nursing note reviewed.  HENT:     Head: Normocephalic and atraumatic.     Mouth/Throat:     Pharynx: Oropharynx is clear.  Eyes:     Extraocular Movements: Extraocular movements intact.     Pupils: Pupils are equal, round, and reactive to light.  Cardiovascular:     Rate and Rhythm: Normal rate and regular rhythm.  Pulmonary:     Comments: Decreased breath sounds bilaterally.  Abdominal:     Palpations: Abdomen is soft.  Musculoskeletal:        General: Normal range of motion.     Cervical back: Normal range of motion.  Skin:    General: Skin is warm.  Neurological:     General: No  focal deficit present.     Mental Status: She is alert and oriented to person, place, and time.  Psychiatric:        Behavior: Behavior normal.        Judgment: Judgment normal.      LABORATORY DATA:  I have reviewed the data as listed Lab Results  Component Value Date   WBC 8.5 10/30/2022   HGB 13.8 10/30/2022   HCT 41.1 10/30/2022   MCV 92.4 10/30/2022   PLT 380 10/30/2022   Recent Labs    10/30/22 1503  NA 138  K 4.0  CL 104  CO2 26  GLUCOSE 98  BUN 17  CREATININE 0.54  CALCIUM 9.4  GFRNONAA >60  PROT 8.1  ALBUMIN 4.1  AST 21  ALT 18  ALKPHOS 78  BILITOT 0.4    RADIOGRAPHIC STUDIES: I have personally reviewed the radiological images as listed and agreed with the findings in the report. MM LT BREAST BX W LOC DEV 1ST LESION IMAGE BX SPEC STEREO GUIDE  Addendum Date: 10/29/2022   ADDENDUM REPORT: 10/29/2022 09:49 ADDENDUM: PATHOLOGY revealed: A. 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 2 mm in size. Grade 2. - Ductal carcinoma in situ: Present, high-grade comedo type with associated calcifications. Lymphovascular invasion: Not identified. Pathology results are CONCORDANT with imaging findings, per Dr. Sande Brothers. Pathology results and recommendations were discussed with patient via telephone on 10/24/2022. Patient reported biopsy site doing well with no adverse symptoms, and only slight tenderness at the site. Post biopsy care instructions were reviewed, questions were answered and my direct phone number was provided. Patient was instructed to call [facility] for any additional questions or concerns related to biopsy site. RECOMMENDATIONS: 1. Surgical and oncological consultation. Request for surgical and oncological consultation relayed to Irving Shows RN at [facility] by Randa Lynn RN on 10/24/2022. 2. Consider pretreatment bilateral breast MRI with and  without contrast to determine extent of breast disease given diagnosis of high grade DCIS. Pathology results reported by Randa Lynn RN on 10/24/2022. Electronically Signed   By: Sande Brothers M.D.   On: 10/29/2022 09:49   Result Date: 10/29/2022 CLINICAL DATA:  Indeterminate left breast calcifications. EXAM: LEFT BREAST STEREOTACTIC CORE NEEDLE BIOPSY COMPARISON:  Previous exam(s). FINDINGS: The patient and I discussed the procedure of stereotactic-guided biopsy including benefits and alternatives. We discussed the high likelihood of a successful procedure. We discussed the risks of the procedure including infection, bleeding, tissue injury, clip migration, and inadequate sampling. Informed written consent was given. The usual time out protocol was performed immediately prior to the procedure. Using sterile technique and 1% Lidocaine as local anesthetic, under stereotactic guidance, a 9 gauge vacuum assisted device was used to perform core needle biopsy of calcifications in the upper-outer left breast using a superior approach. Specimen radiograph was performed showing a few solitary scattered calcifications within the specimens. Specimens with calcifications are identified for pathology. Lesion quadrant: Upper outer quadrant At the conclusion of the procedure, an X shaped tissue marker clip was deployed into the biopsy cavity. Follow-up 2-view mammogram was performed and dictated separately. IMPRESSION: Stereotactic-guided biopsy of the left breast. No apparent complications. Electronically Signed: By: Sande Brothers M.D. On: 10/23/2022 11:08   MM CLIP PLACEMENT LEFT  Result Date: 10/23/2022 CLINICAL DATA:  Left breast stereotactic biopsy. EXAM: 3D DIAGNOSTIC LEFT MAMMOGRAM POST STEREOTACTIC BIOPSY COMPARISON:  Previous exam(s). FINDINGS: 3D Mammographic images were obtained following stereotactic guided biopsy of the upper outer left breast. The biopsy marking clip is in expected position at the site of  biopsy. IMPRESSION: Appropriate positioning of the X shaped biopsy marking clip at the site of biopsy in the upper-outer left breast. Final Assessment: Post Procedure Mammograms for Marker Placement Electronically Signed   By: Sande Brothers M.D.   On: 10/23/2022 11:14  MM DIAG BREAST TOMO UNI LEFT  Result Date: 10/11/2022 CLINICAL DATA:  Six-month follow-up for probably benign calcifications LEFT breast. Patient is status post LEFT lumpectomy in 2005 and RIGHT mastectomy in 2006. She has a prior benign LEFT breast MRI guided biopsy in 2019 (barbell clip). EXAM: DIGITAL DIAGNOSTIC UNILATERAL LEFT MAMMOGRAM WITH TOMOSYNTHESIS TECHNIQUE: Left digital diagnostic mammography and breast tomosynthesis was performed. COMPARISON:  Previous exam(s). ACR Breast Density Category c: The breasts are heterogeneously dense, which may obscure small masses. FINDINGS: Spot magnification views of the left breast demonstrate a 15 mm group of amorphous calcification in the upper-outer left breast middle depth, just anterior to the barbell clip. This appears slightly increased in size and/or conspicuity compared to April 10, 2022. Stable postlumpectomy changes in the left breast. No additional new suspicious mass, calcification, or other findings are identified. IMPRESSION: Left breast 15 mm group of amorphous calcifications in the upper-outer quadrant is low suspicion for malignancy. Recommend further assessment with stereotactic guided biopsy for definitive characterization. RECOMMENDATION: Left breast stereotactic guided biopsy (1 site). I have discussed the findings and recommendations with the patient. If applicable, a reminder letter will be sent to the patient regarding the next appointment. BI-RADS CATEGORY  4: Suspicious. Electronically Signed   By: Jacob Moores M.D.   On: 10/11/2022 10:08   ASSESSMENT & PLAN:   Carcinoma of upper-outer quadrant of left breast in female, estrogen receptor positive # APRIL 2024-left  breast [prior history of lumpectomy/radiation]INVASIVE MAMMARY CARCINOMA, NO SPECIAL TYPE.;  s/p Bx- cT1cN0; ER-positive PR negative HER2/neu negative [Dr.Rodenberg]  # I had a long discussion with the patient in general regarding the treatment options of breast cancer including-surgery; adjuvant radiation; role of adjuvant systemic therapy including-chemotherapy antihormone therapy.    # Discussed surgical options-sentinel lymph node biopsy with lumpectomy versus mastectomy.  Given prior lumpectomy and radiation-patient would likely need mastectomy.  Defer to Dr. Kieth Brightly for further management.  #Discussed that given patient's presurgical clinical characteristics less likely that she will need chemotherapy.  However final decision regarding chemotherapy based on final surgical pathology/gene assay.   #I discussed the role of endocrine therapy-given ER/PR positive disease postsurgery.  Patient will be offered antihormone pill 1 a day for 5 years.  Discussed potential downsides including but not limited to arthralgias hot flashes and osteoporosis.   # # Genetics: Discussed with the patient majority of breast cancers are sporadic however 10 to 20% at risk of genetic/hereditary cancer syndromes.  Discussed importance of genetic counseling/genetic testing-given her young age of first diagnosis and bilateral breast cancers. 2013- BRCA 1 &2- NEG-however consider expanded testing. Patient interested in genetic counseling- .  We will make a referral at next visit-   # active smoker: < 1ppd; Discussed with the patient regarding the ill effects of smoking- including but not limited to cardiac lung and vascular diseases and malignancies. Counseled against smoking.   Thank you Dr.Rodenberg for allowing me to participate in the care of your pleasant patient. Please do not hesitate to contact me with questions or concerns in the interim.  Discussed with Kerin Ransom breast navigator.   # DISPOSITION: # labs-  cbc/cmp; Vit D 25-OH - please order # follow TBD- Dr.B  All questions were answered. The patient/family knows to call the clinic with any problems, questions or concerns.    Earna Coder, MD 10/30/2022 3:58 PM

## 2022-10-30 NOTE — Progress Notes (Signed)
C/o sinus symptoms and cough x2 days.

## 2022-10-30 NOTE — Assessment & Plan Note (Addendum)
#   APRIL 2024-left breast [prior history of lumpectomy/radiation]INVASIVE MAMMARY CARCINOMA, NO SPECIAL TYPE.;  s/p Bx- cT1cN0; ER-positive PR negative HER2/neu negative [Dr.Rodenberg]  # I had a long discussion with the patient in general regarding the treatment options of breast cancer including-surgery; adjuvant radiation; role of adjuvant systemic therapy including-chemotherapy antihormone therapy.    # Discussed surgical options-sentinel lymph node biopsy with lumpectomy versus mastectomy.  Given prior lumpectomy and radiation-patient would likely need mastectomy.  Defer to Dr. Kieth Brightly for further management.  #Discussed that given patient's presurgical clinical characteristics less likely that she will need chemotherapy.  However final decision regarding chemotherapy based on final surgical pathology/gene assay.   #I discussed the role of endocrine therapy-given ER/PR positive disease postsurgery.  Patient will be offered antihormone pill 1 a day for 5 years.  Discussed potential downsides including but not limited to arthralgias hot flashes and osteoporosis.   # # Genetics: Discussed with the patient majority of breast cancers are sporadic however 10 to 20% at risk of genetic/hereditary cancer syndromes.  Discussed importance of genetic counseling/genetic testing-given her young age of first diagnosis and bilateral breast cancers. 2013- BRCA 1 &2- NEG-however consider expanded testing. Patient interested in genetic counseling- .  We will make a referral at next visit-   # active smoker: < 1ppd; Discussed with the patient regarding the ill effects of smoking- including but not limited to cardiac lung and vascular diseases and malignancies. Counseled against smoking.   Thank you Dr.Rodenberg for allowing me to participate in the care of your pleasant patient. Please do not hesitate to contact me with questions or concerns in the interim.  Discussed with Kerin Ransom breast navigator.   #  DISPOSITION: # labs- cbc/cmp; Vit D 25-OH - please order # follow TBD- Dr.B

## 2022-10-31 NOTE — H&P (View-Only) (Signed)
Patient ID: Gabriella Green, female   DOB: 12/11/1950, 72 y.o.   MRN: [id number]  Chief Complaint: Recurrent cancer left breast  History of Present Illness Gabriella Green is a 72 y.o. female with an abnormal follow-up diagnostic mammogram, core biopsy confirming invasive mammary carcinoma.  Prior history of left breast radiation.  Has no interest in breast reconstruction. Consideration of MRI advised... but would not change surgical approach. She initially dealt with her right breast cancer 1999, following lumpectomy with radiation treatment. She subsequently had breast conservation treatment of her left breast in 2005. And followed up with a completion mastectomy in 2006 of her right breast. She does not take any medications currently aside from her omeprazole for her peptic ulcer disease history. Though thin, she reports her weight is stable.   Past Medical History Past Medical History:  Diagnosis Date   BRCA negative 2013   Breast cancer (HCC) 2005   left breast ca   Breast cancer (HCC) 2006, 1999   right breast ca   Cancer (HCC) 1999,2005   right mastectomy and left breast lumpectomy   Personal history of chemotherapy    2006 rt breast ca   Personal history of radiation therapy       Past Surgical History:  Procedure Laterality Date   BREAST BIOPSY Left 2019    ADENOSIS, SKELETAL MUSCLE of LEFT breast   BREAST BIOPSY Left 10/23/2022   Left Breast Stereo Bx, X clip - path pending   BREAST BIOPSY Left 10/23/2022   MM LT BREAST BX W LOC DEV 1ST LESION IMAGE BX SPEC STEREO GUIDE 10/23/2022 [facility]   BREAST CYST ASPIRATION Bilateral    BREAST EXCISIONAL BIOPSY Right 1999   rad breast ca   BREAST EXCISIONAL BIOPSY Left 2005   rad breast ca   BREAST EXCISIONAL BIOPSY Right 2006   mastectomy chemo    BREAST LUMPECTOMY Left 2005   1999 right lumpectomy   CHOLECYSTECTOMY  2002   COLONOSCOPY  2009   COLONOSCOPY WITH PROPOFOL N/A 09/03/2018   Procedure: COLONOSCOPY WITH  PROPOFOL;  Surgeon: Vanga, Rohini Reddy, MD;  Location: [facility];  Service: Gastroenterology;  Laterality: N/A;   ESOPHAGOGASTRODUODENOSCOPY (EGD) WITH PROPOFOL N/A 09/03/2018   Procedure: ESOPHAGOGASTRODUODENOSCOPY (EGD) WITH PROPOFOL;  Surgeon: Vanga, Rohini Reddy, MD;  Location: [facility];  Service: Gastroenterology;  Laterality: N/A;   MASTECTOMY Right 2006    Allergies  Allergen Reactions   Codeine Nausea And Vomiting   Tape Swelling    tegaderm     Current Outpatient Medications  Medication Sig Dispense Refill   Vitamin D, Ergocalciferol, (DRISDOL) 1.25 MG (50000 UNIT) CAPS capsule Take 1 capsule (50,000 Units total) by mouth every 7 (seven) days. 12 capsule 0   omeprazole (PRILOSEC) 40 MG capsule Take 40 mg by mouth daily. (Patient not taking: Reported on 10/30/2022)     No current facility-administered medications for this visit.    Family History Family History  Problem Relation Age of Onset   Breast cancer Mother 39   Breast cancer Sister 40   Breast cancer Maternal Aunt 40      Social History Social History   Tobacco Use   Smoking status: Every Day    Packs/day: 1.00    Years: 20.00    Additional pack years: 0.00    Total pack years: 20.00    Types: Cigarettes    Passive exposure: Past   Smokeless tobacco: Never  Vaping Use   Vaping Use: Never used  Substance Use   Topics   Alcohol use: No   Drug use: No        Review of Systems  All other systems reviewed and are negative.    Physical Exam Blood pressure 129/70, pulse 96, temperature 98 F (36.7 C), height 5' 1" (1.549 m), weight 82 lb 9.6 oz (37.5 kg), SpO2 98 %. Last Weight  Most recent update: 11/05/2022  2:00 PM    Weight  37.5 kg (82 lb 9.6 oz)             CONSTITUTIONAL: Well developed, and seemingly nourished, but underweight female with very little body fat, appropriately responsive and aware without distress.   EYES: Sclera non-icteric.   EARS, NOSE, MOUTH AND THROAT:   The oropharynx is clear. Oral mucosa is pink and moist.    Hearing is intact to voice.  NECK: Trachea is midline, and there is no jugular venous distension.  LYMPH NODES:  Lymph nodes in the neck are not appreciated.  Left axilla is negative for any appreciable lymphadenopathy. RESPIRATORY:  Lungs are clear, and breath sounds are equal bilaterally.  Normal respiratory effort without pathologic use of accessory muscles. CARDIOVASCULAR: Heart is regular in rate and rhythm.   Well perfused.  GI: The abdomen is  soft, nontender, and nondistended. There were no palpable masses.  GU: Caryl Lyn present as chaperone.  Vague lateral left breast mass with pink discoloration of overlying skin, likely from prior/recent biopsy.  Otherwise skin normal with scarring of areolar region.  Left axilla without palpable lymphadenopathy, very easy exam with little to no body fat.  Right breast absent with mastectomy scar.  No suspicious nodularity or masses. MUSCULOSKELETAL:  Symmetrical muscle tone appreciated in all four extremities.    SKIN: Skin turgor is normal. No pathologic skin lesions appreciated.  NEUROLOGIC:  Motor and sensation appear grossly normal.  Cranial nerves are grossly without defect. PSYCH:  Alert and oriented to person, place and time. Affect is appropriate for situation.  Data Reviewed I have personally reviewed what is currently available of the patient's imaging, recent labs and medical records.   Labs:     Latest Ref Rng & Units 10/30/2022    3:03 PM 07/18/2021    2:18 PM  CBC  WBC 4.0 - 10.5 K/uL 8.5  10.7   Hemoglobin 12.0 - 15.0 g/dL 13.8  13.9   Hematocrit 36.0 - 46.0 % 41.1  40.3   Platelets 150 - 400 K/uL 380  451       Latest Ref Rng & Units 10/30/2022    3:03 PM 07/18/2021    2:18 PM  CMP  Glucose 70 - 99 mg/dL 98  106   BUN 8 - 23 mg/dL 17  16   Creatinine 0.44 - 1.00 mg/dL 0.54  0.56   Sodium 135 - 145 mmol/L 138  133   Potassium 3.5 - 5.1 mmol/L 4.0  3.8   Chloride 98 -  111 mmol/L 104  99   CO2 22 - 32 mmol/L 26  24   Calcium 8.9 - 10.3 mg/dL 9.4  9.0   Total Protein 6.5 - 8.1 g/dL 8.1    Total Bilirubin 0.3 - 1.2 mg/dL 0.4    Alkaline Phos 38 - 126 U/L 78    AST 15 - 41 U/L 21    ALT 0 - 44 U/L 18     SURGICAL PATHOLOGY * THIS IS AN ADDENDUM REPORT * CASE: [id number] PATIENT: Bernarda Kasler Surgical Pathology Report *Addendum *    Reason for Addendum #1:  Breast Biomarker Results  Specimen Submitted: A. Breast, left UOQ mid depth  Clinical History: Left UOQ breast calcifications  DIAGNOSIS: A.  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 Size of invasive carcinoma: 2 mm Mm in this sample Histologic grade of invasive carcinoma: Grade 2                      Glandular/tubular differentiation score: 3                      Nuclear pleomorphism score: 3                      Mitotic rate score: 1                      Total score: 7 Ductal carcinoma in situ: Present, high-grade comedo type with associated calcifications Lymphovascular invasion: Not identified  Comment: The definitive grade will be assigned on the excisional specimen. ER/PR/HER2: Immunohistochemistry will be performed on block A2, with reflex to FISH for HER2 2+. The results will be reported in an addendum.  GROSS DESCRIPTION: A. Labeled: Left breast stereo biopsy calcs upper outer quadrant mid depth Received: in a formalin-filled [employer] collection device Specimen radiograph image(s) available for review Time/Date in fixative: Collected at 10:58 AM on 10/23/2022 and placed in formalin at 11:01 AM on 10/23/2022 Cold ischemic time: Less than 5 minutes Total fixation time: Approximately 9.25 hours Core pieces: Multiple Measurement: Aggregate, 8.5 x 1.5 x 0.3 cm Description / comments: Received are cores and fragments of yellow fibrofatty tissue.  The accompanying diagram has sections B and  G checked. Inked: Green Entirely submitted in cassette(s):  1 - section B 2 - section G 3 - 7 - remaining tissue fragments      3 - sections A and C      4 - sections D and E      5 - sections F and H      6 - sections I and J      7 - sections K and L with remaining free-floating fragments  RB 10/23/2022  Final Diagnosis performed by Tara Rubinas, MD.   Electronically signed 10/24/2022 10:40:25AM The electronic signature indicates that the named Attending Pathologist has evaluated the specimen Technical component performed at [facility], [address], [place], [place] [address] Lab: [phone] Dir: Sanjai Nagendra, MD, MMM  Professional component performed at [facility], [facility], [address], [place], [place] [address] Lab: [phone] Dir: Heath M. Jones, MD  ADDENDUM: CASE SUMMARY: BREAST BIOMARKER TESTS Estrogen Receptor (ER) Status: POSITIVE         Percentage of cells with nuclear positivity: Greater than 90%         Average intensity of staining: Strong  Progesterone Receptor (PgR) Status: NEGATIVE (LESS THAN 1%)         Internal control cells present and stain as expected  HER2 (by immunohistochemistry): NEGATIVE (Score 1+)  Ki-67: Not performed    Imaging: Radiological images reviewed:  CLINICAL DATA:  Six-month follow-up for probably benign calcifications LEFT breast. Patient is status post LEFT lumpectomy in 2005 and RIGHT mastectomy in 2006. She has a prior benign LEFT breast MRI guided biopsy in 2019 (barbell clip).   EXAM: DIGITAL DIAGNOSTIC UNILATERAL LEFT MAMMOGRAM WITH TOMOSYNTHESIS   TECHNIQUE: Left digital diagnostic mammography and breast   tomosynthesis was performed.   COMPARISON:  Previous exam(s).   ACR Breast Density Category c: The breasts are heterogeneously dense, which may obscure small masses.   FINDINGS: Spot magnification views of the left breast demonstrate a 15 mm group of amorphous calcification in the  upper-outer left breast middle depth, just anterior to the barbell clip. This appears slightly increased in size and/or conspicuity compared to April 10, 2022. Stable postlumpectomy changes in the left breast. No additional new suspicious mass, calcification, or other findings are identified.   IMPRESSION: Left breast 15 mm group of amorphous calcifications in the upper-outer quadrant is low suspicion for malignancy. Recommend further assessment with stereotactic guided biopsy for definitive characterization.   RECOMMENDATION: Left breast stereotactic guided biopsy (1 site).   I have discussed the findings and recommendations with the patient. If applicable, a reminder letter will be sent to the patient regarding the next appointment.   BI-RADS CATEGORY  4: Suspicious.     Electronically Signed   By: Meghana  Konanur M.D.   On: 10/11/2022 10:08 Within last 24 hrs: No results found.  Assessment    Recurrent breast cancer left breast. Patient Active Problem List   Diagnosis Date Noted   Carcinoma of upper-outer quadrant of left breast in female, estrogen receptor positive (HCC) 10/30/2022   History of bilateral breast cancer 03/02/2020   Encounter for screening mammogram for breast cancer 03/02/2020   Nausea    Colon cancer screening    Cancer (HCC)     Plan    Left total mastectomy with sentinel lymph node biopsy.  Face-to-face time spent with the patient and accompanying care providers(if present) was 30 minutes, with more than 50% of the time spent counseling, educating, and coordinating care of the patient.    These notes generated with voice recognition software. I apologize for typographical errors.  Xavyer Steenson M.D., FACS 11/05/2022, 2:40 PM     

## 2022-10-31 NOTE — Progress Notes (Signed)
Patient ID: Gabriella Green, female   DOB: Jan 05, 1951, 72 y.o.   MRN: [id number]  Chief Complaint: Recurrent cancer left breast  History of Present Illness Gabriella Green is a 72 y.o. female with an abnormal follow-up diagnostic mammogram, core biopsy confirming invasive mammary carcinoma.  Prior history of left breast radiation.  Has no interest in breast reconstruction. Consideration of MRI advised... but would not change surgical approach. She initially dealt with her right breast cancer 1999, following lumpectomy with radiation treatment. She subsequently had breast conservation treatment of her left breast in 2005. And followed up with a completion mastectomy in 2006 of her right breast. She does not take any medications currently aside from her omeprazole for her peptic ulcer disease history. Though thin, she reports her weight is stable.   Past Medical History Past Medical History:  Diagnosis Date   BRCA negative 2013   Breast cancer (HCC) 2005   left breast ca   Breast cancer (HCC) 2006, 1999   right breast ca   Cancer [facility]) [phone]   right mastectomy and left breast lumpectomy   Personal history of chemotherapy    2006 rt breast ca   Personal history of radiation therapy       Past Surgical History:  Procedure Laterality Date   BREAST BIOPSY Left 2019    ADENOSIS, SKELETAL MUSCLE of LEFT breast   BREAST BIOPSY Left 10/23/2022   Left Breast Stereo Bx, X clip - path pending   BREAST BIOPSY Left 10/23/2022   MM LT BREAST BX W LOC DEV 1ST LESION IMAGE BX SPEC STEREO GUIDE 10/23/2022 [facility]   BREAST CYST ASPIRATION Bilateral    BREAST EXCISIONAL BIOPSY Right 1999   rad breast ca   BREAST EXCISIONAL BIOPSY Left 2005   rad breast ca   BREAST EXCISIONAL BIOPSY Right 2006   mastectomy chemo    BREAST LUMPECTOMY Left 2005   1999 right lumpectomy   CHOLECYSTECTOMY  2002   COLONOSCOPY  2009   COLONOSCOPY WITH PROPOFOL N/A 09/03/2018   Procedure: COLONOSCOPY WITH  PROPOFOL;  Surgeon: Toney Reil, MD;  Location: [facility] ENDOSCOPY;  Service: Gastroenterology;  Laterality: N/A;   ESOPHAGOGASTRODUODENOSCOPY (EGD) WITH PROPOFOL N/A 09/03/2018   Procedure: ESOPHAGOGASTRODUODENOSCOPY (EGD) WITH PROPOFOL;  Surgeon: Toney Reil, MD;  Location: [facility] ENDOSCOPY;  Service: Gastroenterology;  Laterality: N/A;   MASTECTOMY Right 2006    Allergies  Allergen Reactions   Codeine Nausea And Vomiting   Tape Swelling    tegaderm     Current Outpatient Medications  Medication Sig Dispense Refill   Vitamin D, Ergocalciferol, (DRISDOL) 1.25 MG (50000 UNIT) CAPS capsule Take 1 capsule (50,000 Units total) by mouth every 7 (seven) days. 12 capsule 0   omeprazole (PRILOSEC) 40 MG capsule Take 40 mg by mouth daily. (Patient not taking: Reported on 10/30/2022)     No current facility-administered medications for this visit.    Family History Family History  Problem Relation Age of Onset   Breast cancer Mother 100   Breast cancer Sister 58   Breast cancer Maternal Aunt 64      Social History Social History   Tobacco Use   Smoking status: Every Day    Packs/day: 1.00    Years: 20.00    Additional pack years: 0.00    Total pack years: 20.00    Types: Cigarettes    Passive exposure: Past   Smokeless tobacco: Never  Vaping Use   Vaping Use: Never used  Substance Use  Topics   Alcohol use: No   Drug use: No        Review of Systems  All other systems reviewed and are negative.    Physical Exam Blood pressure 129/70, pulse 96, temperature 98 F (36.7 C), height 5\' 1"  (1.549 m), weight 82 lb 9.6 oz (37.5 kg), SpO2 98 %. Last Weight  Most recent update: 11/05/2022  2:00 PM    Weight  37.5 kg (82 lb 9.6 oz)             CONSTITUTIONAL: Well developed, and seemingly nourished, but underweight female with very little body fat, appropriately responsive and aware without distress.   EYES: Sclera non-icteric.   EARS, NOSE, MOUTH AND THROAT:   The oropharynx is clear. Oral mucosa is pink and moist.    Hearing is intact to voice.  NECK: Trachea is midline, and there is no jugular venous distension.  LYMPH NODES:  Lymph nodes in the neck are not appreciated.  Left axilla is negative for any appreciable lymphadenopathy. RESPIRATORY:  Lungs are clear, and breath sounds are equal bilaterally.  Normal respiratory effort without pathologic use of accessory muscles. CARDIOVASCULAR: Heart is regular in rate and rhythm.   Well perfused.  GI: The abdomen is  soft, nontender, and nondistended. There were no palpable masses.  GU: Caryl Lyn present as chaperone.  Vague lateral left breast mass with pink discoloration of overlying skin, likely from prior/recent biopsy.  Otherwise skin normal with scarring of areolar region.  Left axilla without palpable lymphadenopathy, very easy exam with little to no body fat.  Right breast absent with mastectomy scar.  No suspicious nodularity or masses. MUSCULOSKELETAL:  Symmetrical muscle tone appreciated in all four extremities.    SKIN: Skin turgor is normal. No pathologic skin lesions appreciated.  NEUROLOGIC:  Motor and sensation appear grossly normal.  Cranial nerves are grossly without defect. PSYCH:  Alert and oriented to person, place and time. Affect is appropriate for situation.  Data Reviewed I have personally reviewed what is currently available of the patient's imaging, recent labs and medical records.   Labs:     Latest Ref Rng & Units 10/30/2022    3:03 PM 07/18/2021    2:18 PM  CBC  WBC 4.0 - 10.5 K/uL 8.5  10.7   Hemoglobin 12.0 - 15.0 g/dL 16.1  09.6   Hematocrit 36.0 - 46.0 % 41.1  40.3   Platelets 150 - 400 K/uL 380  451       Latest Ref Rng & Units 10/30/2022    3:03 PM 07/18/2021    2:18 PM  CMP  Glucose 70 - 99 mg/dL 98  045   BUN 8 - 23 mg/dL 17  16   Creatinine 4.09 - 1.00 mg/dL 8.11  9.14   Sodium 782 - 145 mmol/L 138  133   Potassium 3.5 - 5.1 mmol/L 4.0  3.8   Chloride 98 -  111 mmol/L 104  99   CO2 22 - 32 mmol/L 26  24   Calcium 8.9 - 10.3 mg/dL 9.4  9.0   Total Protein 6.5 - 8.1 g/dL 8.1    Total Bilirubin 0.3 - 1.2 mg/dL 0.4    Alkaline Phos 38 - 126 U/L 78    AST 15 - 41 U/L 21    ALT 0 - 44 U/L 18     SURGICAL PATHOLOGY * THIS IS AN ADDENDUM REPORT * CASE: [id number] PATIENT: Norma Fredrickson Surgical Pathology Report *Addendum *  Reason for Addendum #1:  Breast Biomarker Results  Specimen Submitted: A. Breast, left UOQ mid depth  Clinical History: Left UOQ breast calcifications  DIAGNOSIS: A.  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 Size of invasive carcinoma: 2 mm Mm in this sample Histologic grade of invasive carcinoma: Grade 2                      Glandular/tubular differentiation score: 3                      Nuclear pleomorphism score: 3                      Mitotic rate score: 1                      Total score: 7 Ductal carcinoma in situ: Present, high-grade comedo type with associated calcifications Lymphovascular invasion: Not identified  Comment: The definitive grade will be assigned on the excisional specimen. ER/PR/HER2: Immunohistochemistry will be performed on block A2, with reflex to FISH for HER2 2+. The results will be reported in an addendum.  GROSS DESCRIPTION: A. Labeled: Left breast stereo biopsy calcs upper outer quadrant mid depth Received: in a formalin-filled Brevera collection device Specimen radiograph image(s) available for review Time/Date in fixative: Collected at 10:58 AM on 10/23/2022 and placed in formalin at 11:01 AM on 10/23/2022 Cold ischemic time: Less than 5 minutes Total fixation time: Approximately 9.25 hours Core pieces: Multiple Measurement: Aggregate, 8.5 x 1.5 x 0.3 cm Description / comments: Received are cores and fragments of yellow fibrofatty tissue.  The accompanying diagram has sections B and  G checked. Inked: Green Entirely submitted in cassette(s):  1 - section B 2 - section G 3 - 7 - remaining tissue fragments      3 - sections A and C      4 - sections D and E      5 - sections F and H      6 - sections I and J      7 - sections K and L with remaining free-floating fragments  RB 10/23/2022  Final Diagnosis performed by Elijah Birk, MD.   Electronically signed 10/24/2022 10:40:25AM The electronic signature indicates that the named Attending Pathologist has evaluated the specimen Technical component performed at [facility], [address], [place], [place] [address] Lab: [phone] Dir: Jolene Schimke, MD, MMM  Professional component performed at [facility], [facility], [address] [place], [place], [place] [address] Lab: [phone] Dir: Beryle Quant, MD  ADDENDUM: CASE SUMMARY: BREAST BIOMARKER TESTS Estrogen Receptor (ER) Status: POSITIVE         Percentage of cells with nuclear positivity: Greater than 90%         Average intensity of staining: Strong  Progesterone Receptor (PgR) Status: NEGATIVE (LESS THAN 1%)         Internal control cells present and stain as expected  HER2 (by immunohistochemistry): NEGATIVE (Score 1+)  Ki-67: Not performed    Imaging: Radiological images reviewed:  CLINICAL DATA:  Six-month follow-up for probably benign calcifications LEFT breast. Patient is status post LEFT lumpectomy in 2005 and RIGHT mastectomy in 2006. She has a prior benign LEFT breast MRI guided biopsy in 2019 (barbell clip).   EXAM: DIGITAL DIAGNOSTIC UNILATERAL LEFT MAMMOGRAM WITH TOMOSYNTHESIS   TECHNIQUE: Left digital diagnostic mammography and breast  tomosynthesis was performed.   COMPARISON:  Previous exam(s).   ACR Breast Density Category c: The breasts are heterogeneously dense, which may obscure small masses.   FINDINGS: Spot magnification views of the left breast demonstrate a 15 mm group of amorphous calcification in the  upper-outer left breast middle depth, just anterior to the barbell clip. This appears slightly increased in size and/or conspicuity compared to April 10, 2022. Stable postlumpectomy changes in the left breast. No additional new suspicious mass, calcification, or other findings are identified.   IMPRESSION: Left breast 15 mm group of amorphous calcifications in the upper-outer quadrant is low suspicion for malignancy. Recommend further assessment with stereotactic guided biopsy for definitive characterization.   RECOMMENDATION: Left breast stereotactic guided biopsy (1 site).   I have discussed the findings and recommendations with the patient. If applicable, a reminder letter will be sent to the patient regarding the next appointment.   BI-RADS CATEGORY  4: Suspicious.     Electronically Signed   By: Jacob Moores M.D.   On: 10/11/2022 10:08 Within last 24 hrs: No results found.  Assessment    Recurrent breast cancer left breast. Patient Active Problem List   Diagnosis Date Noted   Carcinoma of upper-outer quadrant of left breast in female, estrogen receptor positive (HCC) 10/30/2022   History of bilateral breast cancer 03/02/2020   Encounter for screening mammogram for breast cancer 03/02/2020   Nausea    Colon cancer screening    Cancer [facility])     Plan    Left total mastectomy with sentinel lymph node biopsy.  Face-to-face time spent with the patient and accompanying care providers(if present) was 30 minutes, with more than 50% of the time spent counseling, educating, and coordinating care of the patient.    These notes generated with voice recognition software. I apologize for typographical errors.  Campbell Lerner M.D., FACS 11/05/2022, 2:40 PM

## 2022-11-04 ENCOUNTER — Other Ambulatory Visit: Payer: Self-pay | Admitting: *Deleted

## 2022-11-04 ENCOUNTER — Telehealth: Payer: Self-pay | Admitting: *Deleted

## 2022-11-04 MED ORDER — VITAMIN D (ERGOCALCIFEROL) 1.25 MG (50000 UNIT) PO CAPS: 50000.0000 [IU] | ORAL_CAPSULE | ORAL | 0 refills | Status: DC

## 2022-11-04 NOTE — Telephone Encounter (Signed)
Informed patient tha her Vit was low. Dr Donneta Romberg [facility] [place]. Pt states she wants [facility] as her pharmacy. [facility] agreed [facility].

## 2022-11-05 ENCOUNTER — Ambulatory Visit (INDEPENDENT_AMBULATORY_CARE_PROVIDER_SITE_OTHER): Payer: No Typology Code available for payment source | Admitting: Surgery

## 2022-11-05 ENCOUNTER — Other Ambulatory Visit: Payer: Self-pay

## 2022-11-05 ENCOUNTER — Encounter: Payer: Self-pay | Admitting: Surgery

## 2022-11-05 ENCOUNTER — Ambulatory Visit: Payer: Self-pay | Admitting: Surgery

## 2022-11-05 VITALS — BP 129/70 | HR 96 | Temp 98.0°F | Ht 61.0 in | Wt 82.6 lb

## 2022-11-05 DIAGNOSIS — C50412 Malignant neoplasm of upper-outer quadrant of left female breast: Secondary | ICD-10-CM | POA: Diagnosis not present

## 2022-11-05 DIAGNOSIS — Z170 Estrogen receptor positive status [ER+]: Secondary | ICD-10-CM | POA: Diagnosis not present

## 2022-11-05 NOTE — Patient Instructions (Addendum)
We have spoken today about breast surgery. Your Mastectomy will be scheduled at [facility] with Dr. Claudine Mouton.  You will most likely go home the same day but may spend at least 1 night in the hospital following surgery and go home with 1-2 drains for approximately 5-10 days following your surgery. Please keep an accurate record of your drain amount in ml's or cc's. If your drain suddenly stops draining or has drainage around the tube at the skin, call our office and speak with a nurse immediately.  Please see the (blue) pre-care surgery sheet that you have been given today for more information regarding your surgery. Our surgery scheduler will call you to look at surgery dates and to go over information about your surgery.   Information regarding your surgery has been provided below. If you have any questions or concerns, please call our office and speak with a nurse.  After the procedure, it is common to have: Blue urine and darker stool for the next 24 hours. This is caused by the dye that was used during the procedure and is normal. Blue skin at the injection site. This may last for up to 8 weeks. Numbness, tingling, or pain near your incision. Swelling or bruising near your incision.  Total or Modified Radical Mastectomy A total mastectomy and a modified radical mastectomy are types of surgery for breast cancer. If you are having a total mastectomy (simple mastectomy), your entire breast will be removed. If you are having a modified radical mastectomy, your breast and nipple will be removed along with the lymph nodes under your arm. You may also have some of the lining over the muscle tissues under your breast removed. LET [facility] CARE PROVIDER KNOW ABOUT: Any allergies you have. All medicines you are taking, including vitamins, herbs, eye drops, creams, and over-the-counter medicines. Previous problems you or members of your family have had with the use of anesthetics. Any blood disorders you  have. Previous surgeries you have had. Medical conditions you have. RISKS AND COMPLICATIONS Generally, this is a safe procedure. However, problems may occur, including: Pain. Infection. Bleeding. Scar tissue. Chest numbness on the side of the surgery. Fluid buildup under the skin flaps where your breast was removed (seroma). Sensation of throbbing or tingling. Stress or sadness from losing your breast. If you have the lymph nodes under your arm removed, you may have arm swelling, weakness, or numbness on the same side of your body as your surgery. BEFORE THE PROCEDURE Ask your health care provider about: Changing or stopping your regular medicines. This is especially important if you are taking diabetes medicines or blood thinners. Taking medicines such as aspirin and ibuprofen. These medicines can thin your blood. Do not take these medicines before your procedure if your health care provider instructs you not to. Follow your health care provider's instructions about eating or drinking restrictions. Plan to have someone take you home after the procedure. PROCEDURE An IV tube will be inserted into one of your veins. You will be given a medicine that makes you fall asleep (general anesthetic). Your breast will be cleaned with a germ-killing solution (antiseptic). A wide incision will be made around your nipple. The skin and nipple inside the incision will be removed along with all breast tissue. If you are having a modified radical mastectomy: The lining over your chest muscles will be removed. The incision may be extended to reach the lymph nodes under your arm, or a second incision may be made. The  lymph nodes will be removed. You may have a drainage tube inserted into your incision to collect fluid that builds up after surgery. This tube is connected to a suction bulb. Your incision or incisions will be closed with stitches (sutures). A bandage (dressing) will be placed over your  breast and under your arm. The procedure may vary among health care providers and hospitals. AFTER THE PROCEDURE You will be moved to a recovery area. Your blood pressure, heart rate, breathing rate, and blood oxygen level will be monitored often until the medicines you were given have worn off. You will be given pain medicine as needed. After a while, you will be taken to a hospital room. You will be encouraged to get up and walk as soon as you can. Your IV tube can be removed when you are able to eat and drink. Your drain may be removed before you go home from the hospital, or you may be sent home with your drain and suction bulb.   This information is not intended to replace advice given to you by your health care provider. Make sure you discuss any questions you have with your health care provider.   Document Released: 03/19/2001 Document Revised: 07/15/2014 Document Reviewed: 03/09/2014 Elsevier Interactive Patient Education [employer]

## 2022-11-06 ENCOUNTER — Telehealth: Payer: Self-pay | Admitting: Surgery

## 2022-11-06 ENCOUNTER — Encounter: Payer: Self-pay | Admitting: *Deleted

## 2022-11-06 DIAGNOSIS — C50412 Malignant neoplasm of upper-outer quadrant of left female breast: Secondary | ICD-10-CM

## 2022-11-06 NOTE — Progress Notes (Addendum)
Gabriella Green's mastectomy is scheduled for 11/13/22.  She will see Dr. Chauncey Mann on 5/22.  Appt. Details have been given to her.  Gabriella Green also asked that Genetic counseling appt be cancelled since she has previously had BRCA testing performed.

## 2022-11-06 NOTE — Telephone Encounter (Signed)
Patient has been advised of Pre-Admission date/time, and Surgery date at [facility].  Surgery Date: 11/13/22, patient to arrive @ 7:30 am as will be having SLN bx injection done first prior to surgery.   Preadmission Testing Date: 11/08/22 (phone 8a-1p)

## 2022-11-08 ENCOUNTER — Encounter
Admission: RE | Admit: 2022-11-08 | Discharge: 2022-11-08 | Disposition: A | Discharge status: Home / Self Care | Payer: No Typology Code available for payment source | Source: Ambulatory Visit | Attending: Surgery | Admitting: Surgery

## 2022-11-08 ENCOUNTER — Other Ambulatory Visit: Payer: Self-pay

## 2022-11-08 DIAGNOSIS — Z01812 Encounter for preprocedural laboratory examination: Secondary | ICD-10-CM

## 2022-11-08 DIAGNOSIS — Z170 Estrogen receptor positive status [ER+]: Secondary | ICD-10-CM

## 2022-11-08 HISTORY — DX: Nausea with vomiting, unspecified: R11.2

## 2022-11-08 HISTORY — DX: Nausea with vomiting, unspecified: Z98.890

## 2022-11-08 NOTE — Patient Instructions (Addendum)
Your procedure is scheduled on:11/13/22 - Wednesday Report to the Registration Desk on the 1st floor of the [facility]. To find out your arrival time, please call [phone] between 1PM - 3PM on: 11/12/22 - Tuesday If your arrival time is 6:00 am, do not arrive before that time as the [facility] entrance doors do not open until 6:00 am.  REMEMBER: Instructions that are not followed completely may result in serious medical risk, up to and including death; or upon the discretion of your surgeon and anesthesiologist your surgery may need to be rescheduled.  Do not eat food or drink after midnight the night before surgery.  No gum chewing or hard candies.  One week prior to surgery: Stop Anti-inflammatories (NSAIDS) such as Advil, Aleve, Ibuprofen, Motrin, Naproxen, Naprosyn and Aspirin based products such as Excedrin, Goody's Powder, BC Powder.  Stop ANY OVER THE COUNTER supplements until after surgery.  You may however, continue to take Tylenol if needed for pain up until the day of surgery.  TAKE ONLY THESE MEDICATIONS THE MORNING OF SURGERY WITH A SIP OF WATER:  none   No Alcohol for 24 hours before or after surgery.  No Smoking including e-cigarettes for 24 hours before surgery.  No chewable tobacco products for at least 6 hours before surgery.  No nicotine patches on the day of surgery.  Do not use any "recreational" drugs for at least a week (preferably 2 weeks) before your surgery.  Please be advised that the combination of cocaine and anesthesia may have negative outcomes, up to and including death. If you test positive for cocaine, your surgery will be cancelled.  On the morning of surgery brush your teeth with toothpaste and water, you may rinse your mouth with mouthwash if you wish. Do not swallow any toothpaste or mouthwash.  Use CHG Soap or wipes as directed on instruction sheet.  Do not wear jewelry, make-up, hairpins, clips or nail polish.  Do not wear  lotions, powders, or perfumes.   Do not shave body hair from the neck down 48 hours before surgery.  Contact lenses, hearing aids and dentures may not be worn into surgery.  Do not bring valuables to the hospital. [facility] is not responsible for any missing/lost belongings or valuables.   Notify your doctor if there is any change in your medical condition (cold, fever, infection).  Wear comfortable clothing (specific to your surgery type) to the hospital.  After surgery, you can help prevent lung complications by doing breathing exercises.  Take deep breaths and cough every 1-2 hours. Your doctor may order a device called an Incentive Spirometer to help you take deep breaths. When coughing or sneezing, hold a pillow firmly against your incision with both hands. This is called "splinting." Doing this helps protect your incision. It also decreases belly discomfort.  If you are being admitted to the hospital overnight, leave your suitcase in the car. After surgery it may be brought to your room.  In case of increased patient census, it may be necessary for you, the patient, to continue your postoperative care in the Same Day Surgery department.  If you are being discharged the day of surgery, you will not be allowed to drive home. You will need a responsible individual to drive you home and stay with you for 24 hours after surgery.   If you are taking public transportation, you will need to have a responsible individual with you.  Please call the Pre-admissions Testing Dept. at [phone] if you have any questions about these instructions.  Surgery Visitation Policy:  Patients having surgery or a procedure may have two visitors.  Children under the age of 50 must have an adult with them who is not the patient.  Inpatient Visitation:    Visiting hours are 7 a.m. to 8 p.m. Up to four visitors are allowed at one time in a patient room. The visitors may rotate out with other people  during the day.  One visitor age 72 or older may stay with the patient overnight and must be in the room by 8 p.m.    Preparing for Surgery with CHLORHEXIDINE GLUCONATE (CHG) Soap  Chlorhexidine Gluconate (CHG) Soap  o An antiseptic cleaner that kills germs and bonds with the skin to continue killing germs even after washing  o Used for showering the night before surgery and morning of surgery  Before surgery, you can play an important role by reducing the number of germs on your skin.  CHG (Chlorhexidine gluconate) soap is an antiseptic cleanser which kills germs and bonds with the skin to continue killing germs even after washing.  Please do not use if you have an allergy to CHG or antibacterial soaps. If your skin becomes reddened/irritated stop using the CHG.  1. Shower the NIGHT BEFORE SURGERY and the MORNING OF SURGERY with CHG soap.  2. If you choose to wash your hair, wash your hair first as usual with your normal shampoo.  3. After shampooing, rinse your hair and body thoroughly to remove the shampoo.  4. Use CHG as you would any other liquid soap. You can apply CHG directly to the skin and wash gently with a scrungie or a clean washcloth.  5. Apply the CHG soap to your body only from the neck down. Do not use on open wounds or open sores. Avoid contact with your eyes, ears, mouth, and genitals (private parts). Wash face and genitals (private parts) with your normal soap.  6. Wash thoroughly, paying special attention to the area where your surgery will be performed.  7. Thoroughly rinse your body with warm water.  8. Do not shower/wash with your normal soap after using and rinsing off the CHG soap.  9. Pat yourself dry with a clean towel.  10. Wear clean pajamas to bed the night before surgery.  12. Place clean sheets on your bed the night of your first shower and do not sleep with pets.  13. Shower again with the CHG soap on the day of surgery prior to arriving at  the hospital.  14. Do not apply any deodorants/lotions/powders.  15. Please wear clean clothes to the hospital.

## 2022-11-11 ENCOUNTER — Encounter
Admission: RE | Admit: 2022-11-11 | Discharge: 2022-11-11 | Disposition: A | Discharge status: Home / Self Care | Payer: No Typology Code available for payment source | Source: Ambulatory Visit | Attending: Surgery | Admitting: Surgery

## 2022-11-11 DIAGNOSIS — Z01810 Encounter for preprocedural cardiovascular examination: Secondary | ICD-10-CM | POA: Diagnosis present

## 2022-11-11 DIAGNOSIS — C50412 Malignant neoplasm of upper-outer quadrant of left female breast: Secondary | ICD-10-CM | POA: Diagnosis not present

## 2022-11-11 DIAGNOSIS — Z170 Estrogen receptor positive status [ER+]: Secondary | ICD-10-CM | POA: Diagnosis not present

## 2022-11-13 ENCOUNTER — Encounter
Admission: RE | Disposition: A | Discharge status: Home / Self Care | Payer: Self-pay | Source: Home / Self Care | Attending: Surgery

## 2022-11-13 ENCOUNTER — Encounter: Payer: Self-pay | Admitting: Surgery

## 2022-11-13 ENCOUNTER — Ambulatory Visit: Payer: No Typology Code available for payment source

## 2022-11-13 ENCOUNTER — Encounter
Admission: RE | Admit: 2022-11-13 | Discharge: 2022-11-13 | Discharge status: Home / Self Care | Payer: No Typology Code available for payment source | Source: Ambulatory Visit | Attending: Surgery | Admitting: Surgery

## 2022-11-13 ENCOUNTER — Other Ambulatory Visit: Payer: Self-pay

## 2022-11-13 ENCOUNTER — Ambulatory Visit
Admission: RE | Admit: 2022-11-13 | Discharge: 2022-11-13 | Disposition: A | Discharge status: Home / Self Care | Payer: No Typology Code available for payment source | Attending: Surgery | Admitting: Surgery

## 2022-11-13 DIAGNOSIS — F17210 Nicotine dependence, cigarettes, uncomplicated: Secondary | ICD-10-CM | POA: Insufficient documentation

## 2022-11-13 DIAGNOSIS — Z170 Estrogen receptor positive status [ER+]: Secondary | ICD-10-CM

## 2022-11-13 DIAGNOSIS — Z79899 Other long term (current) drug therapy: Secondary | ICD-10-CM | POA: Insufficient documentation

## 2022-11-13 DIAGNOSIS — Z853 Personal history of malignant neoplasm of breast: Secondary | ICD-10-CM | POA: Diagnosis present

## 2022-11-13 DIAGNOSIS — C50412 Malignant neoplasm of upper-outer quadrant of left female breast: Secondary | ICD-10-CM

## 2022-11-13 DIAGNOSIS — K279 Peptic ulcer, site unspecified, unspecified as acute or chronic, without hemorrhage or perforation: Secondary | ICD-10-CM | POA: Diagnosis not present

## 2022-11-13 DIAGNOSIS — Z9221 Personal history of antineoplastic chemotherapy: Secondary | ICD-10-CM | POA: Insufficient documentation

## 2022-11-13 DIAGNOSIS — Z923 Personal history of irradiation: Secondary | ICD-10-CM | POA: Diagnosis not present

## 2022-11-13 DIAGNOSIS — D0512 Intraductal carcinoma in situ of left breast: Secondary | ICD-10-CM | POA: Insufficient documentation

## 2022-11-13 DIAGNOSIS — Z9011 Acquired absence of right breast and nipple: Secondary | ICD-10-CM | POA: Diagnosis not present

## 2022-11-13 HISTORY — PX: MASTECTOMY W/ SENTINEL NODE BIOPSY: SHX2001

## 2022-11-13 SURGERY — MASTECTOMY WITH SENTINEL LYMPH NODE BIOPSY
Anesthesia: General | Laterality: Left

## 2022-11-13 MED ORDER — ISOSULFAN BLUE 1 % ~~LOC~~ SOLN
SUBCUTANEOUS | Status: DC | PRN
  Administered 2022-11-13: 4 mL via SUBCUTANEOUS

## 2022-11-13 MED ORDER — PROPOFOL 10 MG/ML IV BOLUS
INTRAVENOUS | Status: AC
  Filled 2022-11-13: qty 20

## 2022-11-13 MED ORDER — DEXAMETHASONE SODIUM PHOSPHATE 10 MG/ML IJ SOLN
INTRAMUSCULAR | Status: AC
  Filled 2022-11-13: qty 1

## 2022-11-13 MED ORDER — FAMOTIDINE 20 MG PO TABS
20.0000 mg | ORAL_TABLET | Freq: Once | ORAL | Status: AC
  Administered 2022-11-13: 20 mg via ORAL

## 2022-11-13 MED ORDER — ACETAMINOPHEN 10 MG/ML IV SOLN
INTRAVENOUS | Status: DC | PRN
  Administered 2022-11-13: 1000 mg via INTRAVENOUS

## 2022-11-13 MED ORDER — ORAL CARE MOUTH RINSE: 15.0000 mL | Freq: Once | OROMUCOSAL | Status: AC

## 2022-11-13 MED ORDER — CHLORHEXIDINE GLUCONATE 0.12 % MT SOLN
15.0000 mL | Freq: Once | OROMUCOSAL | Status: AC
  Administered 2022-11-13: 15 mL via OROMUCOSAL

## 2022-11-13 MED ORDER — LIDOCAINE HCL (CARDIAC) PF 100 MG/5ML IV SOSY
PREFILLED_SYRINGE | INTRAVENOUS | Status: DC | PRN
  Administered 2022-11-13: 45 mg via INTRAVENOUS

## 2022-11-13 MED ORDER — LIDOCAINE HCL (PF) 2 % IJ SOLN
INTRAMUSCULAR | Status: AC
  Filled 2022-11-13: qty 5

## 2022-11-13 MED ORDER — ONDANSETRON HCL 4 MG/2ML IJ SOLN
INTRAMUSCULAR | Status: DC | PRN
  Administered 2022-11-13: 4 mg via INTRAVENOUS

## 2022-11-13 MED ORDER — DROPERIDOL 2.5 MG/ML IJ SOLN
0.6250 mg | Freq: Once | INTRAMUSCULAR | Status: AC
  Administered 2022-11-13: 0.625 mg via INTRAVENOUS

## 2022-11-13 MED ORDER — DROPERIDOL 2.5 MG/ML IJ SOLN
INTRAMUSCULAR | Status: AC
  Filled 2022-11-13: qty 2

## 2022-11-13 MED ORDER — ACETAMINOPHEN 500 MG PO TABS: 1000.0000 mg | ORAL_TABLET | ORAL | Status: DC

## 2022-11-13 MED ORDER — STERILE WATER FOR IRRIGATION IR SOLN
Status: DC | PRN
  Administered 2022-11-13: 500 mL

## 2022-11-13 MED ORDER — CELECOXIB 200 MG PO CAPS
ORAL_CAPSULE | ORAL | Status: AC
  Filled 2022-11-13: qty 1

## 2022-11-13 MED ORDER — HYDROCODONE-ACETAMINOPHEN 5-325 MG PO TABS
1.0000 | ORAL_TABLET | Freq: Four times a day (QID) | ORAL | 0 refills | Status: DC | PRN
Start: 2022-11-13 — End: 2022-11-19

## 2022-11-13 MED ORDER — BUPIVACAINE HCL 0.25 % IJ SOLN
INTRAMUSCULAR | Status: DC | PRN
  Administered 2022-11-13: 15 mL

## 2022-11-13 MED ORDER — CELECOXIB 200 MG PO CAPS
200.0000 mg | ORAL_CAPSULE | ORAL | Status: AC
  Administered 2022-11-13: 200 mg via ORAL

## 2022-11-13 MED ORDER — CHLORHEXIDINE GLUCONATE 0.12 % MT SOLN
OROMUCOSAL | Status: AC
  Filled 2022-11-13: qty 15

## 2022-11-13 MED ORDER — TECHNETIUM TC 99M TILMANOCEPT KIT
1.0000 | PACK | Freq: Once | INTRAVENOUS | Status: AC | PRN
  Administered 2022-11-13: 1 via INTRADERMAL

## 2022-11-13 MED ORDER — EPINEPHRINE PF 1 MG/ML IJ SOLN
INTRAMUSCULAR | Status: AC
  Filled 2022-11-13: qty 1

## 2022-11-13 MED ORDER — CEFAZOLIN SODIUM-DEXTROSE 2-4 GM/100ML-% IV SOLN
INTRAVENOUS | Status: AC
  Filled 2022-11-13: qty 100

## 2022-11-13 MED ORDER — FENTANYL CITRATE (PF) 100 MCG/2ML IJ SOLN
25.0000 ug | INTRAMUSCULAR | Status: DC | PRN
  Administered 2022-11-13 (×2): 25 ug via INTRAVENOUS

## 2022-11-13 MED ORDER — FAMOTIDINE 20 MG PO TABS
ORAL_TABLET | ORAL | Status: AC
  Filled 2022-11-13: qty 1

## 2022-11-13 MED ORDER — OXYCODONE HCL 5 MG/5ML PO SOLN: 5.0000 mg | Freq: Once | ORAL | Status: DC | PRN

## 2022-11-13 MED ORDER — LACTATED RINGERS IV SOLN: INTRAVENOUS | Status: DC | PRN

## 2022-11-13 MED ORDER — ACETAMINOPHEN 500 MG PO TABS
ORAL_TABLET | ORAL | Status: AC
  Filled 2022-11-13: qty 2

## 2022-11-13 MED ORDER — MIDAZOLAM HCL 2 MG/2ML IJ SOLN
INTRAMUSCULAR | Status: AC
  Filled 2022-11-13: qty 2

## 2022-11-13 MED ORDER — BUPIVACAINE LIPOSOME 1.3 % IJ SUSP: 20.0000 mL | Freq: Once | INTRAMUSCULAR | Status: DC

## 2022-11-13 MED ORDER — FENTANYL CITRATE (PF) 100 MCG/2ML IJ SOLN
INTRAMUSCULAR | Status: AC
  Filled 2022-11-13: qty 2

## 2022-11-13 MED ORDER — PROPOFOL 10 MG/ML IV BOLUS
INTRAVENOUS | Status: DC | PRN
  Administered 2022-11-13: 150 mg via INTRAVENOUS

## 2022-11-13 MED ORDER — BUPIVACAINE HCL (PF) 0.25 % IJ SOLN
INTRAMUSCULAR | Status: AC
  Filled 2022-11-13: qty 30

## 2022-11-13 MED ORDER — GABAPENTIN 300 MG PO CAPS
ORAL_CAPSULE | ORAL | Status: AC
  Filled 2022-11-13: qty 1

## 2022-11-13 MED ORDER — CEFAZOLIN SODIUM-DEXTROSE 2-4 GM/100ML-% IV SOLN
2.0000 g | INTRAVENOUS | Status: AC
  Administered 2022-11-13: 2 g via INTRAVENOUS

## 2022-11-13 MED ORDER — CHLORHEXIDINE GLUCONATE CLOTH 2 % EX PADS: 6.0000 | MEDICATED_PAD | Freq: Once | CUTANEOUS | Status: DC

## 2022-11-13 MED ORDER — GABAPENTIN 300 MG PO CAPS
300.0000 mg | ORAL_CAPSULE | ORAL | Status: AC
  Administered 2022-11-13: 300 mg via ORAL

## 2022-11-13 MED ORDER — LACTATED RINGERS IV SOLN: INTRAVENOUS | Status: DC

## 2022-11-13 MED ORDER — OXYCODONE HCL 5 MG PO TABS: 5.0000 mg | ORAL_TABLET | Freq: Once | ORAL | Status: DC | PRN

## 2022-11-13 MED ORDER — ISOSULFAN BLUE 1 % ~~LOC~~ SOLN
SUBCUTANEOUS | Status: AC
  Filled 2022-11-13: qty 5

## 2022-11-13 MED ORDER — DEXMEDETOMIDINE HCL IN NACL 80 MCG/20ML IV SOLN
INTRAVENOUS | Status: DC | PRN
  Administered 2022-11-13 (×2): 4 ug via INTRAVENOUS

## 2022-11-13 MED ORDER — ACETAMINOPHEN 10 MG/ML IV SOLN
INTRAVENOUS | Status: AC
  Filled 2022-11-13: qty 100

## 2022-11-13 MED ORDER — ONDANSETRON HCL 4 MG/2ML IJ SOLN
INTRAMUSCULAR | Status: AC
  Filled 2022-11-13: qty 2

## 2022-11-13 MED ORDER — FENTANYL CITRATE (PF) 100 MCG/2ML IJ SOLN
INTRAMUSCULAR | Status: DC | PRN
  Administered 2022-11-13 (×4): 25 ug via INTRAVENOUS

## 2022-11-13 MED ORDER — MIDAZOLAM HCL 2 MG/2ML IJ SOLN
INTRAMUSCULAR | Status: DC | PRN
  Administered 2022-11-13: 1 mg via INTRAVENOUS

## 2022-11-13 MED ORDER — PROPOFOL 500 MG/50ML IV EMUL
INTRAVENOUS | Status: DC | PRN
  Administered 2022-11-13: 150 ug/kg/min via INTRAVENOUS

## 2022-11-13 MED ORDER — PROPOFOL 1000 MG/100ML IV EMUL
INTRAVENOUS | Status: AC
  Filled 2022-11-13: qty 100

## 2022-11-13 MED ORDER — DEXAMETHASONE SODIUM PHOSPHATE 10 MG/ML IJ SOLN
INTRAMUSCULAR | Status: DC | PRN
  Administered 2022-11-13: 10 mg via INTRAVENOUS

## 2022-11-13 SURGICAL SUPPLY — 50 items
ADH SKN CLS APL DERMABOND .7 (GAUZE/BANDAGES/DRESSINGS) ×1
APL PRP STRL LF DISP 70% ISPRP (MISCELLANEOUS)
BLADE SURG SZ10 CARB STEEL (BLADE) ×2 IMPLANT
BULB RESERV EVAC DRAIN JP 100C (MISCELLANEOUS) IMPLANT
CHLORAPREP W/TINT 26 (MISCELLANEOUS) ×1 IMPLANT
CLIP TI MEDIUM 6 (CLIP) IMPLANT
COVER PROBE GAMMA FINDER SLV (MISCELLANEOUS) IMPLANT
DERMABOND ADVANCED .7 DNX12 (GAUZE/BANDAGES/DRESSINGS) IMPLANT
DRAIN CHANNEL JP 15F RND 16 (MISCELLANEOUS) IMPLANT
DRAPE LAPAROTOMY TRNSV 106X77 (MISCELLANEOUS) ×1 IMPLANT
DRSG GAUZE FLUFF 36X18 (GAUZE/BANDAGES/DRESSINGS) ×1 IMPLANT
DRSG TELFA 3X8 NADH STRL (GAUZE/BANDAGES/DRESSINGS) ×1 IMPLANT
ELECT CAUTERY BLADE 6.4 (BLADE) ×1 IMPLANT
ELECT CAUTERY BLADE TIP 2.5 (TIP) ×1
ELECT REM PT RETURN 9FT ADLT (ELECTROSURGICAL) ×1
ELECTRODE CAUTERY BLDE TIP 2.5 (TIP) ×1 IMPLANT
ELECTRODE REM PT RTRN 9FT ADLT (ELECTROSURGICAL) ×1 IMPLANT
GAUZE 4X4 16PLY ~~LOC~~+RFID DBL (SPONGE) ×1 IMPLANT
GLOVE ORTHO TXT STRL SZ7.5 (GLOVE) ×2 IMPLANT
GOWN STRL REUS W/ TWL LRG LVL3 (GOWN DISPOSABLE) ×1 IMPLANT
GOWN STRL REUS W/ TWL XL LVL3 (GOWN DISPOSABLE) ×1 IMPLANT
GOWN STRL REUS W/TWL LRG LVL3 (GOWN DISPOSABLE) ×1
GOWN STRL REUS W/TWL XL LVL3 (GOWN DISPOSABLE) ×1
HANDLE YANKAUER SUCT BULB TIP (MISCELLANEOUS) IMPLANT
HEMOSTAT ARISTA ABSORB 3G PWDR (HEMOSTASIS) IMPLANT
KIT TURNOVER KIT A (KITS) ×1 IMPLANT
MANIFOLD NEPTUNE II (INSTRUMENTS) ×1 IMPLANT
NDL HYPO 22X1.5 SAFETY MO (MISCELLANEOUS) ×1 IMPLANT
NEEDLE HYPO 22X1.5 SAFETY MO (MISCELLANEOUS) ×1 IMPLANT
PACK BASIN MAJOR ARMC (MISCELLANEOUS) ×1 IMPLANT
SPONGE DRAIN TRACH 4X4 STRL 2S (GAUZE/BANDAGES/DRESSINGS) IMPLANT
SPONGE T-LAP 18X18 ~~LOC~~+RFID (SPONGE) ×3 IMPLANT
STAPLER SKIN PROX 35W (STAPLE) IMPLANT
SUT ETHILON 3-0 FS-10 30 BLK (SUTURE) ×1
SUT MNCRL AB 4-0 PS2 18 (SUTURE) IMPLANT
SUT SILK 2 0 (SUTURE)
SUT SILK 2 0 SH (SUTURE) IMPLANT
SUT SILK 2-0 18XBRD TIE 12 (SUTURE) IMPLANT
SUT VIC AB 3-0 54X BRD REEL (SUTURE) IMPLANT
SUT VIC AB 3-0 BRD 54 (SUTURE) ×2
SUT VIC AB 3-0 SH 27 (SUTURE)
SUT VIC AB 3-0 SH 27X BRD (SUTURE) IMPLANT
SUTURE EHLN 3-0 FS-10 30 BLK (SUTURE) IMPLANT
SWABSTK COMLB BENZOIN TINCTURE (MISCELLANEOUS) IMPLANT
SYR 10ML LL (SYRINGE) IMPLANT
SYR 20ML LL LF (SYRINGE) ×1 IMPLANT
SYR BULB IRRIG 60ML STRL (SYRINGE) ×1 IMPLANT
TRAP FLUID SMOKE EVACUATOR (MISCELLANEOUS) ×1 IMPLANT
WATER STERILE IRR 1000ML POUR (IV SOLUTION) ×1 IMPLANT
WATER STERILE IRR 500ML POUR (IV SOLUTION) ×1 IMPLANT

## 2022-11-13 NOTE — Transfer of Care (Signed)
Immediate Anesthesia Transfer of Care Note  Patient: Gabriella Green  Procedure(s) Performed: MASTECTOMY WITH SENTINEL LYMPH NODE BIOPSY, total mastectomy (Left)  Patient Location: PACU  Anesthesia Type:General  Level of Consciousness: drowsy  Airway & Oxygen Therapy: Patient Spontanous Breathing and Patient connected to face mask oxygen  Post-op Assessment: Report given to RN and Post -op Vital signs reviewed and stable  Post vital signs: Reviewed and stable  Last Vitals:  Vitals Value Taken Time  BP 109/51 11/13/22 1105  Temp    Pulse 55 11/13/22 1108  Resp 19 11/13/22 1108  SpO2 100 % 11/13/22 1108  Vitals shown include unvalidated device data.  Last Pain:  Vitals:   11/13/22 0852  TempSrc: Temporal  PainSc: 0-No pain         Complications: No notable events documented.

## 2022-11-13 NOTE — Anesthesia Preprocedure Evaluation (Signed)
Anesthesia Evaluation  Patient identified by MRN, date of birth, ID band Patient awake    Reviewed: Allergy & Precautions, NPO status , Patient's Chart, lab work & pertinent test results  History of Anesthesia Complications (+) PONV and history of anesthetic complications  Airway Mallampati: III  TM Distance: >3 FB Neck ROM: full    Dental  (+) Chipped   Pulmonary neg pulmonary ROS, Current Smoker   Pulmonary exam normal        Cardiovascular negative cardio ROS Normal cardiovascular exam     Neuro/Psych negative neurological ROS  negative psych ROS   GI/Hepatic negative GI ROS, Neg liver ROS,,,  Endo/Other  negative endocrine ROS    Renal/GU negative Renal ROS     Musculoskeletal   Abdominal   Peds  Hematology negative hematology ROS (+)   Anesthesia Other Findings Past Medical History: 2013: BRCA negative 2005: Breast cancer (HCC)     Comment:  left breast ca 2006, 1999: Breast cancer (HCC)     Comment:  right breast ca 1191,4782: Cancer (HCC)     Comment:  right mastectomy and left breast lumpectomy No date: Personal history of chemotherapy     Comment:  2006 rt breast ca No date: Personal history of radiation therapy No date: PONV (postoperative nausea and vomiting)  Past Surgical History: 2019: BREAST BIOPSY; Left     Comment:   ADENOSIS, SKELETAL MUSCLE of LEFT breast 10/23/2022: BREAST BIOPSY; Left     Comment:  Left Breast Stereo Bx, X clip - path pending 10/23/2022: BREAST BIOPSY; Left     Comment:  MM LT BREAST BX W LOC DEV 1ST LESION IMAGE BX SPEC               STEREO GUIDE 10/23/2022 [facility] No date: BREAST CYST ASPIRATION; Bilateral 1999: BREAST EXCISIONAL BIOPSY; Right     Comment:  rad breast ca 2005: BREAST EXCISIONAL BIOPSY; Left     Comment:  rad breast ca 2006: BREAST EXCISIONAL BIOPSY; Right     Comment:  mastectomy chemo  2005: BREAST LUMPECTOMY; Left     Comment:   1999 right lumpectomy 2002: CHOLECYSTECTOMY 2009: COLONOSCOPY 09/03/2018: COLONOSCOPY WITH PROPOFOL; N/A     Comment:  Procedure: COLONOSCOPY WITH PROPOFOL;  Surgeon: Toney Reil, MD;  Location: [facility];  Service:               Gastroenterology;  Laterality: N/A; 09/03/2018: ESOPHAGOGASTRODUODENOSCOPY (EGD) WITH PROPOFOL; N/A     Comment:  Procedure: ESOPHAGOGASTRODUODENOSCOPY (EGD) WITH               PROPOFOL;  Surgeon: Toney Reil, MD;  Location:               [facility];  Service: Gastroenterology;  Laterality:               N/A; 2006: MASTECTOMY; Right     Reproductive/Obstetrics negative OB ROS                             Anesthesia Physical Anesthesia Plan  ASA: 2  Anesthesia Plan: General ETT   Post-op Pain Management: Toradol IV (intra-op)*, Ofirmev IV (intra-op)* and Dilaudid IV   Induction: Intravenous  PONV Risk Score and Plan: 4 or greater and Treatment may vary due to age or medical condition, Propofol infusion and TIVA  Airway Management Planned:  Oral ETT  Additional Equipment:   Intra-op Plan:   Post-operative Plan: Extubation in OR  Informed Consent: I have reviewed the patients History and Physical, chart, labs and discussed the procedure including the risks, benefits and alternatives for the proposed anesthesia with the patient or authorized representative who has indicated his/her understanding and acceptance.     Dental Advisory Given  Plan Discussed with: Anesthesiologist, CRNA and Surgeon  Anesthesia Plan Comments: (Patient consented for risks of anesthesia including but not limited to:  - adverse reactions to medications - damage to eyes, teeth, lips or other oral mucosa - nerve damage due to positioning  - sore throat or hoarseness - Damage to heart, brain, nerves, lungs, other parts of body or loss of life  Patient voiced understanding.)       Anesthesia Quick  Evaluation

## 2022-11-13 NOTE — Anesthesia Procedure Notes (Signed)
Procedure Name: LMA Insertion Date/Time: 11/13/2022 9:20 AM  Performed by: Lily Lovings, CRNAPre-anesthesia Checklist: Patient identified, Patient being monitored, Timeout performed, Emergency Drugs available and Suction available Patient Re-evaluated:Patient Re-evaluated prior to induction Oxygen Delivery Method: Circle system utilized Preoxygenation: Pre-oxygenation with 100% oxygen Induction Type: IV induction Ventilation: Mask ventilation without difficulty LMA: LMA inserted LMA Size: 4.0 Tube type: Oral Number of attempts: 2 Placement Confirmation: positive ETCO2 and breath sounds checked- equal and bilateral Tube secured with: Tape Dental Injury: Teeth and Oropharynx as per pre-operative assessment

## 2022-11-13 NOTE — Anesthesia Postprocedure Evaluation (Signed)
Anesthesia Post Note  Patient: Gabriella Green  Procedure(s) Performed: MASTECTOMY WITH SENTINEL LYMPH NODE BIOPSY, total mastectomy (Left)  Patient location during evaluation: PACU Anesthesia Type: General Level of consciousness: awake and alert Pain management: pain level controlled Vital Signs Assessment: post-procedure vital signs reviewed and stable Respiratory status: spontaneous breathing, nonlabored ventilation, respiratory function stable and patient connected to nasal cannula oxygen Cardiovascular status: blood pressure returned to baseline and stable Postop Assessment: no apparent nausea or vomiting Anesthetic complications: no   No notable events documented.   Last Vitals:  Vitals:   11/13/22 1115 11/13/22 1130  BP: (!) 123/55 (!) 120/54  Pulse: (!) 56 61  Resp: 17 18  Temp:    SpO2: 100% 100%    Last Pain:  Vitals:   11/13/22 1106  TempSrc:   PainSc: Asleep                 Louie Boston

## 2022-11-13 NOTE — Op Note (Signed)
Procedure Date:  11/13/2022  Pre-operative Diagnosis: Left breast cancer  Post-operative Diagnosis: Same  Procedure: Left simple mastectomy and sentinel lymph node biopsy  Surgeon:  Campbell Lerner, M.D., [facility]  Anesthesia:  General endotracheal  Estimated Blood Loss: 10 ml  Specimens: Left breast, 2 left axillary sentinel lymph nodes.  Complications: None  Indications for Procedure:  This is a 72 y.o. female who presents with left breast cancer, with prior history of left breast cancer previously treated with breast conservation with radiation.  The risks of bleeding, infection, injury to surrounding structures, hematoma, seroma, open wound, cosmetic deformity, and the need for further surgery were all discussed with the patient and was willing to proceed.  Prior to this procedure, the patient had undergone sentinel lymphoscintigraphy.  Description of Procedure: The patient was correctly identified in the preoperative area and brought into the operating room.  The patient was placed supine with VTE prophylaxis in place.  Appropriate time-outs were performed.  Anesthesia was induced and the patient was intubated.  Appropriate antibiotics were infused.  A Lymphazurin blue was injected in the  periareolar region under aseptic conditions, massage administered for 5 minutes. The left chest and axilla were prepped and draped in usual sterile fashion.  Then using the hand-held probe an area of high counts was identified in the axilla,  The cavity was irrigated and hemostasis was assured with electrocautery.  Local anesthetic was infiltrated into the skin and subcutaneous tissue of the cavity.    An elliptical incision was then completed over the breast encompassing the nipple-areolar complex, with attention to the lesion of which we are concerned.  Using electrosurgery, subcutaneous flaps were created superiorly to the clavicle, inferiorly to the inframammary fold, medially to the sternum, and  laterally to the latissimus dorsi with careful attention to create flaps of adequate thickness.  The breast tissue was then dissected with the pectoralis fascia as the deep margin.  2-0 silk suture was used to mark the specimen as short superior and long lateral.    Dissection was continued into the axilla.  Right angled dissection and blunt was used to dissect down the subcutaneous tissue and the hand-held probe was used to guide dissection. A hot pair of lymph nodes were identified and resected.  This had a count of 7,000 and 2,800.  No additional lymph nodes were noted as blue, and the wound bed had a count of 200s.  The cavity was then irrigated and hemostasis was assured..    15 Fr Blake drain was placed via lateral stab incision to drain the mastectomy wound bed.  The incision was then closed  with 3-0 Vicryl and sealed with DermaBond.  The drain site was dressed.  The patient was emerged from anesthesia and extubated and brought to the recovery room for further management.  The patient tolerated the procedure well and all counts were correct at the end of the case.  Sentinel Node Biopsy Synoptic Operative Report  Operation performed with curative intent:Yes  Tracer(s) used to identify sentinel nodes in the upfront surgery (non-neoadjuvant) setting (select all that apply):Dye and Radioactive Tracer  Tracer(s) used to identify sentinel nodes in the neoadjuvant setting (select all that apply):N/A  All nodes (colored or non-colored) present at the end of a dye-filled lymphatic channel were removed:Yes   All significantly radioactive nodes were removed:Yes  All palpable suspicious nodes were removed:Yes  Biopsy-proven positive nodes marked with clips prior to chemotherapy were identified and removed:N/A    Campbell Lerner M.D., [facility]  11/13/2022, 11:00 AM

## 2022-11-13 NOTE — Interval H&P Note (Signed)
History and Physical Interval Note:  11/13/2022 8:42 AM  Gabriella Green  has presented today for surgery, with the diagnosis of left breast cancer.  The various methods of treatment have been discussed with the patient and family. After consideration of risks, benefits and other options for treatment, the patient has consented to  Procedure(s): MASTECTOMY WITH SENTINEL LYMPH NODE BIOPSY, total mastectomy (Left) as a surgical intervention.  The patient's history has been reviewed, patient examined, no change in status, stable for surgery.  I have reviewed the patient's chart and labs.  Questions were answered to the patient's satisfaction.   The left side is marked.   Campbell Lerner

## 2022-11-13 NOTE — Discharge Instructions (Signed)
AMBULATORY SURGERY  ?DISCHARGE INSTRUCTIONS ? ? ?The drugs that you were given will stay in your system until tomorrow so for the next 24 hours you should not: ? ?Drive an automobile ?Make any legal decisions ?Drink any alcoholic beverage ? ? ?You may resume regular meals tomorrow.  Today it is better to start with liquids and gradually work up to solid foods. ? ?You may eat anything you prefer, but it is better to start with liquids, then soup and crackers, and gradually work up to solid foods. ? ? ?Please notify your doctor immediately if you have any unusual bleeding, trouble breathing, redness and pain at the surgery site, drainage, fever, or pain not relieved by medication. ? ? ? ?Additional Instructions: ? ? ? ?Please contact your physician with any problems or Same Day Surgery at [phone], Monday through Friday 6 am to 4 pm, or [place] at  Main number at [phone].  ?

## 2022-11-14 ENCOUNTER — Encounter: Payer: Self-pay | Admitting: Surgery

## 2022-11-15 ENCOUNTER — Other Ambulatory Visit: Payer: Self-pay | Admitting: Anatomic Pathology & Clinical Pathology

## 2022-11-15 LAB — SURGICAL PATHOLOGY

## 2022-11-18 NOTE — Progress Notes (Unsigned)
[facility] POST-OP OFFICE VISIT  11/19/2022  HPI: Gabriella Green is a 72 y.o. female 6 days s/p left simple mastectomy with sentinel lymph node biopsy.  We have her path report which is encouraging, margins are negative, with 2 sentinel lymph nodes also negative.  Her drain output has diminished rapidly and stays low.  It is serous in color.  Vital signs: BP (!) 148/67   Pulse 83   Temp 97.7 F (36.5 C) (Oral)   Ht 5\' 1"  (1.549 m)   Wt 83 lb 3.2 oz (37.7 kg)   SpO2 98%   BMI 15.72 kg/m    Physical Exam: Constitutional: She appears well, reporting minimal pain medication intake.  Skin: Left mastectomy site incision, Dermabond intact.  Skin flaps are clean dry and intact.  Drain output is serous and there is no evidence of seroma, hematoma whatsoever.  We proceeded with removal of the drain which she tolerated well.  Dressing applied there.  Instructions given  Assessment/Plan: This is a 72 y.o. female 6 days s/p simple mastectomy with sentinel lymph node biopsy. Verbal pathology report noted.  She has follow-up with oncology.  She can follow-up with me in 3 weeks as needed.  Patient Active Problem List   Diagnosis Date Noted   Carcinoma of upper-outer quadrant of left breast in female, estrogen receptor positive (HCC) 10/30/2022   History of bilateral breast cancer 03/02/2020   Encounter for screening mammogram for breast cancer 03/02/2020   Nausea    Colon cancer screening    Cancer (HCC)     -Advised that she begin range of motion exercises with her left arm starting in approximately 1 week.  Noted she also has occupational therapy appointment for a week from now which may coincide or overlap plans for range of motion exercises for the left arm and shoulder.  Cautioned her to watch for development of seroma or changes in her skin flap.   Campbell Lerner M.D., FACS 11/19/2022, 1:42 PM

## 2022-11-19 ENCOUNTER — Encounter: Payer: Self-pay | Admitting: *Deleted

## 2022-11-19 ENCOUNTER — Encounter: Payer: Self-pay | Admitting: Surgery

## 2022-11-19 ENCOUNTER — Ambulatory Visit (INDEPENDENT_AMBULATORY_CARE_PROVIDER_SITE_OTHER): Payer: No Typology Code available for payment source | Admitting: Surgery

## 2022-11-19 VITALS — BP 148/67 | HR 83 | Temp 97.7°F | Ht 61.0 in | Wt 83.2 lb

## 2022-11-19 DIAGNOSIS — Z08 Encounter for follow-up examination after completed treatment for malignant neoplasm: Secondary | ICD-10-CM

## 2022-11-19 DIAGNOSIS — Z9013 Acquired absence of bilateral breasts and nipples: Secondary | ICD-10-CM

## 2022-11-19 DIAGNOSIS — Z170 Estrogen receptor positive status [ER+]: Secondary | ICD-10-CM

## 2022-11-19 DIAGNOSIS — Z9012 Acquired absence of left breast and nipple: Secondary | ICD-10-CM

## 2022-11-19 DIAGNOSIS — C50412 Malignant neoplasm of upper-outer quadrant of left female breast: Secondary | ICD-10-CM

## 2022-11-19 NOTE — Progress Notes (Signed)
Oncotype order submitted online on specimen [id number]

## 2022-11-19 NOTE — Patient Instructions (Addendum)
If you have any concerns or questions, please feel free to call our office. Follow up as needed.   Total or Modified Radical Mastectomy, Care After The following information offers guidance on how to care for yourself after your procedure. Your health care provider may also give you more specific instructions. If you have problems or questions, contact your health care provider. What can I expect after the procedure? After the procedure, it is common to have: Pain, soreness, or tenderness. Numbness. Swelling. Neuropathic (nerve) pain in the chest wall, armpit, or arm. Stiffness in the arm or shoulder. Feelings of stress, sadness, or depression. If the lymph nodes under your arm were removed, you may have arm swelling, weakness, or numbness on the same side of your body as your surgery. Follow these instructions at home: Incision care  Follow instructions from your health care provider about how to take care of your incision. Make sure you: Wash your hands with soap and water for at least 20 seconds before you change your bandage (dressing). If soap and water are not available, use hand sanitizer. Change your dressing as told by your health care provider. Leave stitches (sutures), skin glue, or adhesive strips in place. These skin closures may need to stay in place for 2 weeks or longer. If adhesive strip edges start to loosen and curl up, you may trim the loose edges. Do not remove adhesive strips completely unless your health care provider tells you to do that. Check your incision area every day for signs of infection. Check for: Redness, swelling, or more pain. Fluid or blood. Warmth. Pus or a bad smell. If you were sent home with a surgical drain in place, follow instructions from your health care provider. This may include emptying the drain and keeping track of the amount of drainage. Bathing Do not take baths, swim, or use a hot tub until your health care provider approves. Ask your  health care provider if you may take showers. You may only be allowed to take sponge baths. Activity  Return to your normal activities as told by your health care provider. Ask your health care provider what activities are safe for you. It may take several weeks to return to full activity. Avoid activities that take a lot of effort. Rest as told by your health care provider. Ask friends and family to help with chores, including child care, meal preparation, laundry, and shopping. Be careful to avoid any activities that could cause an injury to your arm on the side of your surgery. Do not lift anything that is heavier than 10 lb (4.5 kg), or the limit that you are told, until your health care provider says that it is safe. Avoid lifting with the arm on the side of your surgery. Do not carry heavy objects on your shoulder. After your drain is removed, do exercises to prevent stiffness and swelling in your arm. Talk with your health care provider about which exercises are safe for you. General instructions Take over-the-counter and prescription medicines only as told by your health care provider. You may eat what you usually do. Keep your arm raised (elevated) above the level of your heart when you are sitting or lying down. Do not wear tight jewelry on your arm, wrist, or fingers on the side of your surgery. You may be given a tight sleeve (compression bandage) to wear over your arm on the side of your surgery. Wear this sleeve as told by your health care provider. Ask your   health care provider when you can start wearing a bra or using a breast prosthesis. Before you are involved in certain procedures, such as giving blood or having your blood pressure checked, tell all of your health care providers if lymph nodes under your arm were removed. This is important information. Follow-up Keep all follow-up visits. This is important. Get checked for extra fluid around your lymph nodes (lymphedema) as  often as told by your health care provider. Medicines Take over-the-counter and prescription medicines as told by your health care provider. Take your antibiotic medicine as told by your health care provider. Do not stop taking the antibiotic even if you start to feel better. Ask your health care provider if the medicine prescribed to you: Requires you to avoid driving or using machinery. Can cause constipation. You may need to take these actions to prevent or treat constipation: Drink enough fluid to keep your urine pale yellow. Take over-the-counter or prescription medicines. Eat foods that are high in fiber, such as beans, whole grains, and fresh fruits and vegetables. Limit foods that are high in fat and processed sugars, such as fried or sweet foods. Lifestyle Do not use any products that contain nicotine or tobacco before the procedure. These products include cigarettes, chewing tobacco, and vaping devices, such as e-cigarettes. These can delay incision healing. If you need help quitting, ask your health care provider. Contact a health care provider if: You have chills or a fever. Your pain medicine is not working. Your arm swelling, weakness, or numbness has not improved after a few weeks. You have new swelling in your breast area or arm. You have redness, swelling, or more pain in your incision area. You have fluid or blood coming from your incision. Your incision feels warm to the touch. You have pus or a bad smell coming from your incision. Get help right away if: You have very bad pain in your breast area or arm. You have a sudden onset of chest pain. You have a fast heart rate. You have difficulty breathing. These symptoms may be an emergency. Get help right away. Call 911. Do not wait to see if the symptoms will go away. Do not drive yourself to the hospital. Summary Follow instructions from your health care provider about how to take care of your incision. Check your  incision area every day for signs of infection. Ask your health care provider what activities are safe for you. Keep all follow-up visits. This is important. Contact a health care provider if you have new swelling in your breast area or arm. This information is not intended to replace advice given to you by your health care provider. Make sure you discuss any questions you have with your health care provider. Document Revised: 03/25/2021 Document Reviewed: 03/25/2021 Elsevier Patient Education  2023 [employer]  

## 2022-11-27 ENCOUNTER — Other Ambulatory Visit: Payer: No Typology Code available for payment source

## 2022-11-27 ENCOUNTER — Encounter: Payer: No Typology Code available for payment source | Admitting: Licensed Clinical Social Worker

## 2022-11-27 ENCOUNTER — Encounter: Payer: Self-pay | Admitting: Internal Medicine

## 2022-11-27 ENCOUNTER — Inpatient Hospital Stay: Payer: No Typology Code available for payment source | Admitting: Occupational Therapy

## 2022-11-27 ENCOUNTER — Encounter: Payer: Self-pay | Admitting: *Deleted

## 2022-11-27 ENCOUNTER — Inpatient Hospital Stay: Payer: No Typology Code available for payment source | Admitting: Internal Medicine

## 2022-11-27 NOTE — Progress Notes (Signed)
Spoke to Gabriella Green, she didn't understand why she needed to follow up with Dr. Leonard Schwartz.  Explained why and she is willing to reschedule.   [name] will call her this afternoon to go over Dr. Senaida Lange availability.

## 2022-12-04 ENCOUNTER — Encounter: Payer: Self-pay | Admitting: Internal Medicine

## 2022-12-04 ENCOUNTER — Inpatient Hospital Stay: Payer: No Typology Code available for payment source | Attending: Internal Medicine | Admitting: Internal Medicine

## 2022-12-04 ENCOUNTER — Telehealth: Payer: Self-pay | Admitting: Internal Medicine

## 2022-12-04 VITALS — BP 141/72 | HR 77 | Temp 97.4°F | Ht 61.0 in | Wt 84.0 lb

## 2022-12-04 DIAGNOSIS — Z803 Family history of malignant neoplasm of breast: Secondary | ICD-10-CM | POA: Diagnosis not present

## 2022-12-04 DIAGNOSIS — Z9013 Acquired absence of bilateral breasts and nipples: Secondary | ICD-10-CM | POA: Diagnosis not present

## 2022-12-04 DIAGNOSIS — Z9221 Personal history of antineoplastic chemotherapy: Secondary | ICD-10-CM | POA: Diagnosis not present

## 2022-12-04 DIAGNOSIS — Z170 Estrogen receptor positive status [ER+]: Secondary | ICD-10-CM | POA: Diagnosis present

## 2022-12-04 DIAGNOSIS — M549 Dorsalgia, unspecified: Secondary | ICD-10-CM | POA: Diagnosis not present

## 2022-12-04 DIAGNOSIS — Z853 Personal history of malignant neoplasm of breast: Secondary | ICD-10-CM | POA: Diagnosis not present

## 2022-12-04 DIAGNOSIS — R059 Cough, unspecified: Secondary | ICD-10-CM | POA: Insufficient documentation

## 2022-12-04 DIAGNOSIS — F17210 Nicotine dependence, cigarettes, uncomplicated: Secondary | ICD-10-CM | POA: Diagnosis not present

## 2022-12-04 DIAGNOSIS — Z923 Personal history of irradiation: Secondary | ICD-10-CM | POA: Diagnosis not present

## 2022-12-04 DIAGNOSIS — C50412 Malignant neoplasm of upper-outer quadrant of left female breast: Secondary | ICD-10-CM | POA: Insufficient documentation

## 2022-12-04 DIAGNOSIS — M2550 Pain in unspecified joint: Secondary | ICD-10-CM | POA: Diagnosis not present

## 2022-12-04 NOTE — Progress Notes (Signed)
[facility] CONSULT NOTE  Patient Care Team: Revelo, Presley Raddle, MD as PCP - General (Family Medicine) Kieth Brightly, MD (General Surgery) Jim Like, RN as Registered Nurse Hulen Luster, RN as Oncology Nurse Navigator  CHIEF COMPLAINTS/PURPOSE OF CONSULTATION: Breast cancer  #  Oncology History Overview Note  BRCA negative 2013    Breast cancer [facility]) 2005    left breast ca   Breast cancer [facility]) 2006, 1999    right breast ca   Cancer [facility]) [phone]   # Left breast- 1999 "mild ducts"- s/p lumpectomy; s/p RT- no chemo [Dr.Sankar/Dr.Byrnett]  # 2006 ? Stage- s/p right mastectomy s/p chemo [Dr.Choksi]; Dr.Chrystal     DIAGNOSIS: A.  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 Size of invasive carcinoma: 2 mm Mm in this sample Histologic grade of invasive carcinoma: Grade 2                      Glandular/tubular differentiation score: 3                      Nuclear pleomorphism score: 3                      Mitotic rate score: 1                      Total score: 7 Ductal carcinoma in situ: Present, high-grade comedo type with associated calcifications Lymphovascular invasion: Not identified  Comment: The definitive grade will be assigned on the excisional specimen. ER/PR/HER2: Immunohistochemistry will be performed on block A2, with reflex to FISH for HER2 2+. The results will be reported in an addendum.  GROSS DESCRIPTION: A. Labeled: Left breast stereo biopsy calcs upper outer quadrant mid depth Received: in a formalin-filled Brevera collection device Specimen radiograph image(s) available for review Time/Date in fixative: Collected at 10:58 AM on 10/23/2022 and placed in formalin at 11:01 AM on 10/23/2022 Cold ischemic time: Less than 5 minutes Total fixation time: Approximately 9.25 hours Core pieces: Multiple Measurement: Aggregate, 8.5 x 1.5 x  0.3 cm Description / comments: Received are cores and fragments of yellow fibrofatty tissue.  The accompanying diagram has sections B and G checked. Inked: Green Entirely submitted in cassette(s):  1 - section B 2 - section G 3 - 7 - remaining tissue fragments      3 - sections A and C      4 - sections D and E      5 - sections F and H      6 - sections I and J      7 - sections K and L with remaining free-floating fragments  RB 10/23/2022  Final Diagnosis performed by Elijah Birk, MD.   Electronically signed 10/24/2022 10:40:25AM The electronic signature indicates that the named Attending Pathologist has evaluated the specimen Technical component performed at [place], [address] [place], [place] [address] Lab: [phone] Dir: Jolene Schimke, MD, MMM  Professional component performed at [facility], [facility], [address] [place], [place], [place] [address] Lab: [phone] Dir: Beryle Quant, MD  ADDENDUM: CASE SUMMARY: BREAST BIOMARKER TESTS Estrogen Receptor (ER) Status: POSITIVE         Percentage of cells with nuclear positivity: Greater than 90%         Average intensity of staining: Strong  Progesterone Receptor (PgR) Status:  NEGATIVE (LESS THAN 1%)         Internal control cells present and stain as expected  HER2 (by immunohistochemistry): NEGATIVE (Score 1+)  Histologic Type: Invasive carcinoma of no special type (ductal) Histologic Grade (Nottingham Histologic Score)      Glandular (Acinar)/Tubular Differentiation: 3      Nuclear Pleomorphism: 3      Mitotic Rate: 3      Overall Grade: Grade 3 Tumor Size: At least 17 mm Tumor Focality: Single focus of invasive carcinoma Ductal Carcinoma In Situ (DCIS): Present      Positive for extensive intraductal component (EIC) Tumor Extent: Not applicable Lymphatic and/or Vascular Invasion: Not identified  Treatment Effect in the Breast: No known presurgical therapy  MARGINS Margin Status for  Invasive Carcinoma: All margins negative for invasive carcinoma   APRIL/MAY 2024-left breast invasive mammary carcinoma s/p mastectomy [Dr. Rodenberg]-at least pT1c [17 mm]; 2 sentinel lymph nodes negative; grade 3; Oncotype 35 high risk-declined chemotherapy.      Carcinoma of upper-outer quadrant of left breast in female, estrogen receptor positive (HCC)  10/30/2022 Initial Diagnosis   Carcinoma of upper-outer quadrant of left breast in female, estrogen receptor positive   10/30/2022 Cancer Staging   Staging form: Breast, AJCC 8th Edition - Clinical: Stage IA (cT1a, cN0, cM0, G2, ER+, PR-, HER2-) - Signed by Earna Coder, MD on 10/30/2022 Histologic grading system: 3 grade system   12/05/2022 -  Chemotherapy   Patient is on Treatment Plan : BREAST TC q21d      HISTORY OF PRESENTING ILLNESS: Patient ambulating-independently.  Alone.   Gabriella Green 72 y.o.  female patient with a history of metachronous breast cancer -most recently left-sided ER/PR positive HER2 negative stage I s/p mastectomy-is  here to review the results of her pathology and Oncotype results.  Patient is recovering well from surgery.  She denies any worsening swelling or pain.  Surgery drain tubes are out.  Review of Systems  Constitutional:  Negative for chills, diaphoresis, fever, malaise/fatigue and weight loss.  HENT:  Negative for nosebleeds and sore throat.   Eyes:  Negative for double vision.  Respiratory:  Positive for cough. Negative for hemoptysis, sputum production, shortness of breath and wheezing.   Cardiovascular:  Negative for chest pain, palpitations, orthopnea and leg swelling.  Gastrointestinal:  Negative for abdominal pain, blood in stool, constipation, diarrhea, heartburn, melena, nausea and vomiting.  Genitourinary:  Negative for dysuria, frequency and urgency.  Musculoskeletal:  Positive for back pain and joint pain.  Skin: Negative.  Negative for itching and rash.  Neurological:   Negative for dizziness, tingling, focal weakness, weakness and headaches.  Endo/Heme/Allergies:  Does not bruise/bleed easily.  Psychiatric/Behavioral:  Negative for depression. The patient is not nervous/anxious and does not have insomnia.      MEDICAL HISTORY:  Past Medical History:  Diagnosis Date   BRCA negative 2013   Breast cancer [facility]) 2005   left breast ca   Breast cancer (HCC) 2006, 1999   right breast ca   Cancer [facility]) [phone]   right mastectomy and left breast lumpectomy   Personal history of chemotherapy    2006 rt breast ca   Personal history of radiation therapy    PONV (postoperative nausea and vomiting)     SURGICAL HISTORY: Past Surgical History:  Procedure Laterality Date   BREAST BIOPSY Left 2019    ADENOSIS, SKELETAL MUSCLE of LEFT breast   BREAST BIOPSY Left 10/23/2022   Left Breast Stereo Bx,  X clip - path pending   BREAST BIOPSY Left 10/23/2022   MM LT BREAST BX W LOC DEV 1ST LESION IMAGE BX SPEC STEREO GUIDE 10/23/2022 [facility]   BREAST CYST ASPIRATION Bilateral    BREAST EXCISIONAL BIOPSY Right 1999   rad breast ca   BREAST EXCISIONAL BIOPSY Left 2005   rad breast ca   BREAST EXCISIONAL BIOPSY Right 2006   mastectomy chemo    BREAST LUMPECTOMY Left 2005   1999 right lumpectomy   CHOLECYSTECTOMY  2002   COLONOSCOPY  2009   COLONOSCOPY WITH PROPOFOL N/A 09/03/2018   Procedure: COLONOSCOPY WITH PROPOFOL;  Surgeon: Toney Reil, MD;  Location: [facility] ENDOSCOPY;  Service: Gastroenterology;  Laterality: N/A;   ESOPHAGOGASTRODUODENOSCOPY (EGD) WITH PROPOFOL N/A 09/03/2018   Procedure: ESOPHAGOGASTRODUODENOSCOPY (EGD) WITH PROPOFOL;  Surgeon: Toney Reil, MD;  Location: [facility] ENDOSCOPY;  Service: Gastroenterology;  Laterality: N/A;   MASTECTOMY Right 2006   MASTECTOMY W/ SENTINEL NODE BIOPSY Left 11/13/2022   Procedure: MASTECTOMY WITH SENTINEL LYMPH NODE BIOPSY, total mastectomy;  Surgeon: Campbell Lerner, MD;  Location: [facility] ORS;   Service: General;  Laterality: Left;    SOCIAL HISTORY: Social History   Socioeconomic History   Marital status: Widowed    Spouse name: Not on file   Number of children: Not on file   Years of education: Not on file   Highest education level: Not on file  Occupational History   Not on file  Tobacco Use   Smoking status: Every Day    Packs/day: 1.00    Years: 20.00    Additional pack years: 0.00    Total pack years: 20.00    Types: Cigarettes    Passive exposure: Past   Smokeless tobacco: Never  Vaping Use   Vaping Use: Never used  Substance and Sexual Activity   Alcohol use: No   Drug use: No   Sexual activity: Not on file  Other Topics Concern   Not on file  Social History Narrative   Not on file   Social Determinants of Health   Financial Resource Strain: Not on file  Food Insecurity: No Food Insecurity (10/30/2022)   Hunger Vital Sign    Worried About Running Out of Food in the Last Year: Never true    Ran Out of Food in the Last Year: Never true  Transportation Needs: No Transportation Needs (10/30/2022)   PRAPARE - Administrator, Civil Service (Medical): No    Lack of Transportation (Non-Medical): No  Physical Activity: Not on file  Stress: Not on file  Social Connections: Not on file  Intimate Partner Violence: Not on file    FAMILY HISTORY: Family History  Problem Relation Age of Onset   Breast cancer Mother 57   Breast cancer Sister 61   Breast cancer Maternal Aunt 40    ALLERGIES:  is allergic to codeine and tape.  MEDICATIONS:  Current Outpatient Medications  Medication Sig Dispense Refill   Vitamin D, Ergocalciferol, (DRISDOL) 1.25 MG (50000 UNIT) CAPS capsule Take 50,000 Units by mouth every 7 (seven) days.     acetaminophen (TYLENOL) 500 MG tablet Take 1,000 mg by mouth every 6 (six) hours as needed for mild pain. (Patient not taking: Reported on 12/04/2022)     Pseudoeph-Doxylamine-DM-APAP (NYQUIL PO) Take by mouth as needed.  (Patient not taking: Reported on 12/04/2022)     No current facility-administered medications for this visit.      Marland Kitchen  PHYSICAL EXAMINATION:   Vitals:  12/04/22 1014  BP: (!) 141/72  Pulse: 77  Temp: (!) 97.4 F (36.3 C)  SpO2: 98%    Filed Weights   12/04/22 1014  Weight: 84 lb (38.1 kg)     Physical Exam Vitals and nursing note reviewed.  HENT:     Head: Normocephalic and atraumatic.     Mouth/Throat:     Pharynx: Oropharynx is clear.  Eyes:     Extraocular Movements: Extraocular movements intact.     Pupils: Pupils are equal, round, and reactive to light.  Cardiovascular:     Rate and Rhythm: Normal rate and regular rhythm.  Pulmonary:     Comments: Decreased breath sounds bilaterally.  Abdominal:     Palpations: Abdomen is soft.  Musculoskeletal:        General: Normal range of motion.     Cervical back: Normal range of motion.  Skin:    General: Skin is warm.  Neurological:     General: No focal deficit present.     Mental Status: She is alert and oriented to person, place, and time.  Psychiatric:        Behavior: Behavior normal.        Judgment: Judgment normal.      LABORATORY DATA:  I have reviewed the data as listed Lab Results  Component Value Date   WBC 8.5 10/30/2022   HGB 13.8 10/30/2022   HCT 41.1 10/30/2022   MCV 92.4 10/30/2022   PLT 380 10/30/2022   Recent Labs    10/30/22 1503  NA 138  K 4.0  CL 104  CO2 26  GLUCOSE 98  BUN 17  CREATININE 0.54  CALCIUM 9.4  GFRNONAA >60  PROT 8.1  ALBUMIN 4.1  AST 21  ALT 18  ALKPHOS 78  BILITOT 0.4    RADIOGRAPHIC STUDIES: I have personally reviewed the radiological images as listed and agreed with the findings in the report. NM Sentinel Node Inj-No Rpt (Breast)  Result Date: 11/13/2022 Sulfur Colloid was injected by the Nuclear Medicine Technologist for sentinel lymph node localization.    ASSESSMENT & PLAN:   Carcinoma of upper-outer quadrant of left breast in female,  estrogen receptor positive (HCC) # APRIL 2024-left breast  INVASIVE MAMMARY CARCINOMA, s/p mastectomy [Dr.Rodenberg]; 1999-? DCIS s/p lumpectomy/radiation;Dr.Sankar] pT1c [84mm ]pN0; ER-positive PR negative HER2/neu negative; G-3   # ONCOTYPE  RS-32 high risk of recurrence.  The estimated risk of recurrence with endocrine therapy ALONE > 20 % risk of recurrence.  And hence chemotherapy is recommended.  Given patient's previous chemotherapy/and current stage I cancer I think it is reasonable to proceed with Taxotere Cytoxan chemotherapy.  Every 3 weeks for total of 4 cycles.  Patient will need growth factor support.  #  I reviewed at length the individual components with chemotherapy; and the schedule in detail.  I also discussed the potential side effects including but not limited to-increasing fatigue, nausea vomiting, diarrhea, hair loss, sores in the mouth, increase risk of infection and also neuropathy.  Also reviewed the multiple strategies to avoid/mitigate similar side effects including preemptive medications-for nausea vomiting.  Also discussed at length regarding good oral hygiene/hydration and in general precautions regarding duration of infections.   # After the lengthy discussion patient states that she needs some time to decide if she wants to go on chemotherapy.  She is reluctant however not made any decisions yet.  Also discussed the option of clinical trial with CDK inhibitor.  Discussed with clinical trials regarding screening.  #  I discussed the role of endocrine therapy-given ER/PR positive disease postsurgery.  Patient will be offered antihormone pill 1 a day for 10 years.  Discussed potential downsides including but not limited to arthralgias hot flashes and osteoporosis.  Also discuss adjuvant Zometa.  #  Genetics: Given her young age of first diagnosis and bilateral breast cancers. 2013- BRCA 1 &2- NEG-however consider expanded testing. Patient interested in genetic counseling-  We  will make a referral at next visit-   # active smoker: < 1ppd;   # Chemotherapy education; discussed regarding port placement.   # DISPOSITION: # Chemotherapy education- taxotere- cytoxan x4 cycles. # follow up TBD-Dr. B  # 40 minutes face-to-face with the patient discussing the above plan of care; more than 50% of time spent on prognosis/ natural history; counseling and coordination.   All questions were answered. The patient/family knows to call the clinic with any problems, questions or concerns.    Earna Coder, MD 12/04/2022 11:55 AM

## 2022-12-04 NOTE — Progress Notes (Signed)
C/o area on surgery site that has a pocket of fluid.

## 2022-12-04 NOTE — Progress Notes (Signed)
START ON PATHWAY REGIMEN - Breast     A cycle is every 21 days:     Cyclophosphamide      Docetaxel   **Always confirm dose/schedule in your pharmacy ordering system**  Patient Characteristics: Postoperative without Neoadjuvant Therapy, M0 (Pathologic Staging), Invasive Disease, Adjuvant Therapy, HER2 Negative, ER Positive, Node Negative, pT1a, pN93mi or pT1b-c, pN0/N41mi or pT2 or Higher, pN0, Oncotype High Risk (? 26) Therapeutic Status: Postoperative without Neoadjuvant Therapy, M0 (Pathologic Staging) AJCC Grade: G3 AJCC N Category: pN0 AJCC M Category: cM0 ER Status: Positive (+) AJCC 8 Stage Grouping: IA HER2 Status: Negative (-) Oncotype Dx Recurrence Score: 32 AJCC T Category: pT1c PR Status: Positive (+) Has this patient completed genomic testing<= Yes - Oncotype DX(R) Intent of Therapy: Curative Intent, Discussed with Patient

## 2022-12-04 NOTE — Telephone Encounter (Signed)
Wrap up request Chemotherapy education- taxotere- cytoxan x4 cycles- patient did not want to schedule until she checks her work schedule. She will call back tomorrow with days that work for her.

## 2022-12-04 NOTE — Assessment & Plan Note (Addendum)
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  The estimated risk of recurrence with endocrine therapy ALONE > 20 % risk of recurrence.  And hence chemotherapy is recommended.  Given patient's previous chemotherapy/and current stage I cancer I think it is reasonable to proceed with Taxotere Cytoxan chemotherapy.  Every 3 weeks for total of 4 cycles.  Patient will need growth factor support.  #  I reviewed at length the individual components with chemotherapy; and the schedule in detail.  I also discussed the potential side effects including but not limited to-increasing fatigue, nausea vomiting, diarrhea, hair loss, sores in the mouth, increase risk of infection and also neuropathy.  Also reviewed the multiple strategies to avoid/mitigate similar side effects including preemptive medications-for nausea vomiting.  Also discussed at length regarding good oral hygiene/hydration and in general precautions regarding duration of infections.   # After the lengthy discussion patient states that she needs some time to decide if she wants to go on chemotherapy.  She is reluctant however not made any decisions yet.  Also discussed the option of clinical trial with CDK inhibitor.  Discussed with clinical trials regarding screening.  #I discussed the role of endocrine therapy-given ER/PR positive disease postsurgery.  Patient will be offered antihormone pill 1 a day for 10 years.  Discussed potential downsides including but not limited to arthralgias hot flashes and osteoporosis.  Also discuss adjuvant Zometa.  #  Genetics: Given her young age of first diagnosis and bilateral breast cancers. 2013- BRCA 1 &2- NEG-however consider expanded testing. Patient interested in genetic counseling-  We will make a referral at next visit-    # active smoker: < 1ppd;   # Chemotherapy education; discussed regarding port placement.   # DISPOSITION: # Chemotherapy education- taxotere- cytoxan x4 cycles. # follow up TBD-Dr. B  # 40 minutes face-to-face with the patient discussing the above plan of care; more than 50% of time spent on prognosis/ natural history; counseling and coordination.

## 2022-12-05 ENCOUNTER — Other Ambulatory Visit: Payer: Self-pay

## 2022-12-11 ENCOUNTER — Encounter: Payer: Self-pay | Admitting: Internal Medicine

## 2022-12-11 ENCOUNTER — Telehealth: Payer: Self-pay | Admitting: *Deleted

## 2022-12-11 MED ORDER — ANASTROZOLE 1 MG PO TABS: 1.0000 mg | ORAL_TABLET | Freq: Every day | ORAL | 1 refills | Status: DC

## 2022-12-11 NOTE — Telephone Encounter (Signed)
Prescription sent for anastrozole to her pharmacy; [facility] in [place]

## 2022-12-11 NOTE — Progress Notes (Signed)
Spoke to patient regarding recommendation for chemotherapy adjuvant.  Patient declined.  No clinical trials.  Started the patient on anastrozole 90-day prescription sent.  Patient will follow-up with me in 3 months.  She will call us if she has any issues filling the prescription.  Thanks, GB

## 2022-12-12 ENCOUNTER — Encounter: Payer: Self-pay | Admitting: *Deleted

## 2022-12-13 ENCOUNTER — Other Ambulatory Visit: Payer: Self-pay

## 2022-12-17 ENCOUNTER — Encounter: Payer: Self-pay | Admitting: Surgery

## 2022-12-17 ENCOUNTER — Ambulatory Visit (INDEPENDENT_AMBULATORY_CARE_PROVIDER_SITE_OTHER): Payer: No Typology Code available for payment source | Admitting: Surgery

## 2022-12-17 VITALS — BP 126/64 | HR 96 | Temp 98.2°F | Ht 61.0 in | Wt 84.4 lb

## 2022-12-17 DIAGNOSIS — L7634 Postprocedural seroma of skin and subcutaneous tissue following other procedure: Secondary | ICD-10-CM

## 2022-12-17 DIAGNOSIS — Z170 Estrogen receptor positive status [ER+]: Secondary | ICD-10-CM

## 2022-12-17 DIAGNOSIS — Z9012 Acquired absence of left breast and nipple: Secondary | ICD-10-CM

## 2022-12-17 DIAGNOSIS — C50412 Malignant neoplasm of upper-outer quadrant of left female breast: Secondary | ICD-10-CM

## 2022-12-17 DIAGNOSIS — Z08 Encounter for follow-up examination after completed treatment for malignant neoplasm: Secondary | ICD-10-CM

## 2022-12-17 NOTE — Patient Instructions (Signed)
If you have any concerns or questions, please feel free to call our office. See follow up appointment.   Total or Modified Radical Mastectomy, Care After The following information offers guidance on how to care for yourself after your procedure. Your health care provider may also give you more specific instructions. If you have problems or questions, contact your health care provider. What can I expect after the procedure? After the procedure, it is common to have: Pain, soreness, or tenderness. Numbness. Swelling. Neuropathic (nerve) pain in the chest wall, armpit, or arm. Stiffness in the arm or shoulder. Feelings of stress, sadness, or depression. If the lymph nodes under your arm were removed, you may have arm swelling, weakness, or numbness on the same side of your body as your surgery. Follow these instructions at home: Incision care  Follow instructions from your health care provider about how to take care of your incision. Make sure you: Wash your hands with soap and water for at least 20 seconds before you change your bandage (dressing). If soap and water are not available, use hand sanitizer. Change your dressing as told by your health care provider. Leave stitches (sutures), skin glue, or adhesive strips in place. These skin closures may need to stay in place for 2 weeks or longer. If adhesive strip edges start to loosen and curl up, you may trim the loose edges. Do not remove adhesive strips completely unless your health care provider tells you to do that. Check your incision area every day for signs of infection. Check for: Redness, swelling, or more pain. Fluid or blood. Warmth. Pus or a bad smell. If you were sent home with a surgical drain in place, follow instructions from your health care provider. This may include emptying the drain and keeping track of the amount of drainage. Bathing Do not take baths, swim, or use a hot tub until your health care provider approves. Ask  your health care provider if you may take showers. You may only be allowed to take sponge baths. Activity  Return to your normal activities as told by your health care provider. Ask your health care provider what activities are safe for you. It may take several weeks to return to full activity. Avoid activities that take a lot of effort. Rest as told by your health care provider. Ask friends and family to help with chores, including child care, meal preparation, laundry, and shopping. Be careful to avoid any activities that could cause an injury to your arm on the side of your surgery. Do not lift anything that is heavier than 10 lb (4.5 kg), or the limit that you are told, until your health care provider says that it is safe. Avoid lifting with the arm on the side of your surgery. Do not carry heavy objects on your shoulder. After your drain is removed, do exercises to prevent stiffness and swelling in your arm. Talk with your health care provider about which exercises are safe for you. General instructions Take over-the-counter and prescription medicines only as told by your health care provider. You may eat what you usually do. Keep your arm raised (elevated) above the level of your heart when you are sitting or lying down. Do not wear tight jewelry on your arm, wrist, or fingers on the side of your surgery. You may be given a tight sleeve (compression bandage) to wear over your arm on the side of your surgery. Wear this sleeve as told by your health care provider. Ask your   health care provider when you can start wearing a bra or using a breast prosthesis. Before you are involved in certain procedures, such as giving blood or having your blood pressure checked, tell all of your health care providers if lymph nodes under your arm were removed. This is important information. Follow-up Keep all follow-up visits. This is important. Get checked for extra fluid around your lymph nodes (lymphedema)  as often as told by your health care provider. Medicines Take over-the-counter and prescription medicines as told by your health care provider. Take your antibiotic medicine as told by your health care provider. Do not stop taking the antibiotic even if you start to feel better. Ask your health care provider if the medicine prescribed to you: Requires you to avoid driving or using machinery. Can cause constipation. You may need to take these actions to prevent or treat constipation: Drink enough fluid to keep your urine pale yellow. Take over-the-counter or prescription medicines. Eat foods that are high in fiber, such as beans, whole grains, and fresh fruits and vegetables. Limit foods that are high in fat and processed sugars, such as fried or sweet foods. Lifestyle Do not use any products that contain nicotine or tobacco before the procedure. These products include cigarettes, chewing tobacco, and vaping devices, such as e-cigarettes. These can delay incision healing. If you need help quitting, ask your health care provider. Contact a health care provider if: You have chills or a fever. Your pain medicine is not working. Your arm swelling, weakness, or numbness has not improved after a few weeks. You have new swelling in your breast area or arm. You have redness, swelling, or more pain in your incision area. You have fluid or blood coming from your incision. Your incision feels warm to the touch. You have pus or a bad smell coming from your incision. Get help right away if: You have very bad pain in your breast area or arm. You have a sudden onset of chest pain. You have a fast heart rate. You have difficulty breathing. These symptoms may be an emergency. Get help right away. Call 911. Do not wait to see if the symptoms will go away. Do not drive yourself to the hospital. Summary Follow instructions from your health care provider about how to take care of your incision. Check your  incision area every day for signs of infection. Ask your health care provider what activities are safe for you. Keep all follow-up visits. This is important. Contact a health care provider if you have new swelling in your breast area or arm. This information is not intended to replace advice given to you by your health care provider. Make sure you discuss any questions you have with your health care provider. Document Revised: 03/25/2021 Document Reviewed: 03/25/2021 Elsevier Patient Education  2024 [employer]vier Inc.  

## 2022-12-18 DIAGNOSIS — L7634 Postprocedural seroma of skin and subcutaneous tissue following other procedure: Secondary | ICD-10-CM | POA: Insufficient documentation

## 2022-12-18 NOTE — Progress Notes (Signed)
[facility] POST-OP OFFICE VISIT  12/18/2022  HPI: Gabriella Green is a 72 y.o. female month s/p left simple mastectomy with sentinel lymph node biopsy.   Who noted a gradual development of a fluid collection following her drain removal.  It is achieved its current status, and has not gotten any worse.  She denies any fevers or chills.  Denies any tenderness or pain.  She denies any diminished range of motion.  She denies any drainage from her mastectomy site.  Vital signs: BP 126/64   Pulse 96   Temp 98.2 F (36.8 C) (Oral)   Ht 5\' 1"  (1.549 m)   Wt 84 lb 6.4 oz (38.3 kg)   SpO2 96%   BMI 15.95 kg/m    Physical Exam: Constitutional: She appears well, taking her Arimidex.  Skin: Left mastectomy site incision,  intact.  Skin flaps are doing well, and there is evidence of seroma.  It appears to be fluid but limited to the lateral aspect of the inferior flap toward the axilla.  I suspect the volume is approximately 15 mL perhaps.  Completely nontender.  The skin overlying the area is healthy.  Assessment/Plan: This is a 72 y.o. female 6 days s/p simple mastectomy with sentinel lymph node biopsy. Now with small laterally based seroma. Patient Active Problem List   Diagnosis Date Noted   Status post bilateral mastectomy 11/19/2022   Carcinoma of upper-outer quadrant of left breast in female, estrogen receptor positive (HCC) 10/30/2022   History of bilateral breast cancer 03/02/2020   Encounter for screening mammogram for breast cancer 03/02/2020   Nausea    Colon cancer screening    Cancer (HCC)     -We discussed various options, I feel it is prudent not to pursue aspiration at this time.  Anticipating giving it time for spontaneous resolution.  We discussed waiting a couple months to see how it progresses or regresses on its own.  Will be glad to assist her if it becomes more troublesome.  It clearly does not bother her per her report, just wanted [place] to be aware of it  and discuss its etiology, or if other things should be of concern.  Campbell Lerner M.D., FACS 12/18/2022, 5:21 PM

## 2022-12-30 ENCOUNTER — Encounter: Payer: Self-pay | Admitting: Surgery

## 2023-01-16 ENCOUNTER — Encounter: Payer: No Typology Code available for payment source | Admitting: Surgery

## 2023-01-16 NOTE — Progress Notes (Deleted)
[facility] POST-OP OFFICE VISIT  01/16/2023  HPI: Gabriella Green is a 72 y.o. female s/p left simple mastectomy with sentinel lymph node biopsy.   Who noted a gradual development of a fluid collection following her drain removal.  It is achieved its current status, and has *** gotten any worse.  She denies any fevers or chills.  Denies any tenderness or pain.  She denies any diminished range of motion.  She denies any drainage from her mastectomy site.  Vital signs: There were no vitals taken for this visit.   Physical Exam: Constitutional: She appears well, taking her Arimidex.  Skin: Left mastectomy site incision,  intact.  Skin flaps are doing well, and there is evidence of seroma.  It appears to be fluid but limited to the lateral aspect of the inferior flap toward the axilla.  I suspect the volume is approximately 15 mL perhaps.  Completely nontender.  The skin overlying the area is healthy.  Assessment/Plan: This is a 73 y.o. female 6 days s/p simple mastectomy with sentinel lymph node biopsy. Now with small laterally based seroma. Patient Active Problem List   Diagnosis Date Noted   Postoperative seroma of subcutaneous tissue after non-dermatologic procedure 12/18/2022   Status post bilateral mastectomy 11/19/2022   Carcinoma of upper-outer quadrant of left breast in female, estrogen receptor positive (HCC) 10/30/2022   History of bilateral breast cancer 03/02/2020   Encounter for screening mammogram for breast cancer 03/02/2020   Nausea    Colon cancer screening    Cancer (HCC)     -We discussed various options, I feel it is prudent not to pursue aspiration at this time.  Anticipating giving it time for spontaneous resolution.  We discussed waiting a couple months to see how it progresses or regresses on its own.  Will be glad to assist her if it becomes more troublesome.  It clearly does not bother her per her report, just wanted [place] to be aware of it and  discuss its etiology, or if other things should be of concern.  Campbell Lerner M.D., FACS 01/16/2023, 1:16 PM

## 2023-01-17 ENCOUNTER — Other Ambulatory Visit: Payer: Self-pay | Admitting: Family Medicine

## 2023-01-17 DIAGNOSIS — Z1231 Encounter for screening mammogram for malignant neoplasm of breast: Secondary | ICD-10-CM

## 2023-01-29 ENCOUNTER — Other Ambulatory Visit: Payer: Self-pay | Admitting: Family Medicine

## 2023-01-29 ENCOUNTER — Ambulatory Visit
Admission: RE | Admit: 2023-01-29 | Discharge: 2023-01-29 | Disposition: A | Discharge status: Home / Self Care | Payer: No Typology Code available for payment source | Source: Ambulatory Visit | Attending: Family Medicine | Admitting: Family Medicine

## 2023-01-29 DIAGNOSIS — M25572 Pain in left ankle and joints of left foot: Secondary | ICD-10-CM

## 2023-03-14 ENCOUNTER — Inpatient Hospital Stay: Payer: No Typology Code available for payment source | Attending: Internal Medicine | Admitting: Internal Medicine

## 2023-03-19 DIAGNOSIS — M810 Age-related osteoporosis without current pathological fracture: Secondary | ICD-10-CM | POA: Insufficient documentation

## 2023-03-21 ENCOUNTER — Other Ambulatory Visit: Payer: Self-pay

## 2023-03-25 ENCOUNTER — Other Ambulatory Visit: Payer: Self-pay | Admitting: Family Medicine

## 2023-03-25 DIAGNOSIS — M810 Age-related osteoporosis without current pathological fracture: Secondary | ICD-10-CM

## 2023-04-28 ENCOUNTER — Other Ambulatory Visit: Payer: Self-pay

## 2023-04-29 ENCOUNTER — Other Ambulatory Visit: Payer: Self-pay

## 2023-04-29 ENCOUNTER — Other Ambulatory Visit (INDEPENDENT_AMBULATORY_CARE_PROVIDER_SITE_OTHER): Payer: Self-pay | Admitting: Nurse Practitioner

## 2023-04-29 DIAGNOSIS — I739 Peripheral vascular disease, unspecified: Secondary | ICD-10-CM

## 2023-04-30 ENCOUNTER — Ambulatory Visit (INDEPENDENT_AMBULATORY_CARE_PROVIDER_SITE_OTHER): Payer: No Typology Code available for payment source

## 2023-04-30 ENCOUNTER — Encounter (INDEPENDENT_AMBULATORY_CARE_PROVIDER_SITE_OTHER): Payer: Self-pay | Admitting: Nurse Practitioner

## 2023-04-30 ENCOUNTER — Other Ambulatory Visit: Payer: No Typology Code available for payment source

## 2023-04-30 ENCOUNTER — Ambulatory Visit (INDEPENDENT_AMBULATORY_CARE_PROVIDER_SITE_OTHER): Payer: No Typology Code available for payment source | Admitting: Nurse Practitioner

## 2023-04-30 VITALS — BP 129/69 | HR 84 | Resp 15 | Ht 61.0 in | Wt 84.6 lb

## 2023-04-30 DIAGNOSIS — I739 Peripheral vascular disease, unspecified: Secondary | ICD-10-CM | POA: Diagnosis not present

## 2023-05-05 ENCOUNTER — Encounter (INDEPENDENT_AMBULATORY_CARE_PROVIDER_SITE_OTHER): Payer: Self-pay | Admitting: Nurse Practitioner

## 2023-05-12 NOTE — Progress Notes (Signed)
Subjective:    Patient ID: Gabriella Green, female    DOB: [birth year], 72 y.o.   MRN: [id number] Chief Complaint  Patient presents with   New Patient (Initial Visit)    Ref Sherrell Puller consult claudication/chronic venous insufficiency    The patient presents today for possible concern for claudication.  In addition to chronic venous insufficiency.  Currently the patient is being treated for breast cancer.  She denies claudication-like pain but endorses some neuropathic leg pain.  Additionally she notes that she sometimes gets a spot on her left leg that comes and goes after a few days.  She denies rest pain or any open wounds or ulcerations.  Today she has an ABI of 1.17 on the right and 1.11 on the left.  She has normal TBI's bilaterally with strong triphasic tibial artery waveforms and normal toe waveforms bilaterally.    Review of Systems  Cardiovascular:  Negative for leg swelling.  Skin:  Negative for wound.  All other systems reviewed and are negative.      Objective:   Physical Exam Vitals reviewed.  HENT:     Head: Normocephalic.  Cardiovascular:     Rate and Rhythm: Normal rate.     Pulses: Normal pulses.  Pulmonary:     Effort: Pulmonary effort is normal.  Skin:    General: Skin is warm and dry.  Neurological:     Mental Status: She is alert and oriented to person, place, and time.  Psychiatric:        Mood and Affect: Mood normal.        Behavior: Behavior normal.        Thought Content: Thought content normal.        Judgment: Judgment normal.     BP 129/69 (BP Location: Left Arm)   Pulse 84   Resp 15   Ht 5\' 1"  (1.549 m)   Wt 84 lb 9.6 oz (38.4 kg)   BMI 15.99 kg/m   Past Medical History:  Diagnosis Date   BRCA negative 2013   Breast cancer (HCC) 2005   left breast ca   Breast cancer (HCC) 2006, 1999   right breast ca   Cancer [facility]) [phone]   right mastectomy and left breast lumpectomy   Personal history of chemotherapy    2006 rt breast ca    Personal history of radiation therapy    PONV (postoperative nausea and vomiting)     Social History   Socioeconomic History   Marital status: Widowed    Spouse name: Not on file   Number of children: Not on file   Years of education: Not on file   Highest education level: Not on file  Occupational History   Not on file  Tobacco Use   Smoking status: Every Day    Current packs/day: 1.00    Average packs/day: 1 pack/day for 20.0 years (20.0 ttl pk-yrs)    Types: Cigarettes    Passive exposure: Past   Smokeless tobacco: Never  Vaping Use   Vaping status: Never Used  Substance and Sexual Activity   Alcohol use: No   Drug use: No   Sexual activity: Not on file  Other Topics Concern   Not on file  Social History Narrative   Not on file   Social Determinants of Health   Financial Resource Strain: Not on file  Food Insecurity: No Food Insecurity (10/30/2022)   Hunger Vital Sign    Worried About Running Out of Food  in the Last Year: Never true    Ran Out of Food in the Last Year: Never true  Transportation Needs: No Transportation Needs (10/30/2022)   PRAPARE - Administrator, Civil Service (Medical): No    Lack of Transportation (Non-Medical): No  Physical Activity: Not on file  Stress: Not on file  Social Connections: Not on file  Intimate Partner Violence: Not on file    Past Surgical History:  Procedure Laterality Date   BREAST BIOPSY Left 2019    ADENOSIS, SKELETAL MUSCLE of LEFT breast   BREAST BIOPSY Left 10/23/2022   Left Breast Stereo Bx, X clip - path pending   BREAST BIOPSY Left 10/23/2022   MM LT BREAST BX W LOC DEV 1ST LESION IMAGE BX SPEC STEREO GUIDE 10/23/2022 [facility]   BREAST CYST ASPIRATION Bilateral    BREAST EXCISIONAL BIOPSY Right 1999   rad breast ca   BREAST EXCISIONAL BIOPSY Left 2005   rad breast ca   BREAST EXCISIONAL BIOPSY Right 2006   mastectomy chemo    BREAST LUMPECTOMY Left 2005   1999 right lumpectomy    CHOLECYSTECTOMY  2002   COLONOSCOPY  2009   COLONOSCOPY WITH PROPOFOL N/A 09/03/2018   Procedure: COLONOSCOPY WITH PROPOFOL;  Surgeon: Toney Reil, MD;  Location: [facility] ENDOSCOPY;  Service: Gastroenterology;  Laterality: N/A;   ESOPHAGOGASTRODUODENOSCOPY (EGD) WITH PROPOFOL N/A 09/03/2018   Procedure: ESOPHAGOGASTRODUODENOSCOPY (EGD) WITH PROPOFOL;  Surgeon: Toney Reil, MD;  Location[facility] ENDOSCOPY;  Service: Gastroenterology;  Laterality: N/A;   MASTECTOMY Right 2006   MASTECTOMY W/ SENTINEL NODE BIOPSY Left 11/13/2022   Procedure: MASTECTOMY WITH SENTINEL LYMPH NODE BIOPSY, total mastectomy;  Surgeon: Campbell Lerner, MD;  Location: [facility] ORS;  Service: General;  Laterality: Left;    Family History  Problem Relation Age of Onset   Breast cancer Mother 81   Breast cancer Sister 73   Breast cancer Maternal Aunt 40    Allergies  Allergen Reactions   Codeine Nausea And Vomiting   Tape Swelling    tegaderm        Latest Ref Rng & Units 10/30/2022    3:03 PM 07/18/2021    2:18 PM  CBC  WBC 4.0 - 10.5 K/uL 8.5  10.7   Hemoglobin 12.0 - 15.0 g/dL 96.0  45.4   Hematocrit 36.0 - 46.0 % 41.1  40.3   Platelets 150 - 400 K/uL 380  451       CMP     Component Value Date/Time   NA 138 10/30/2022 1503   K 4.0 10/30/2022 1503   CL 104 10/30/2022 1503   CO2 26 10/30/2022 1503   GLUCOSE 98 10/30/2022 1503   BUN 17 10/30/2022 1503   CREATININE 0.54 10/30/2022 1503   CALCIUM 9.4 10/30/2022 1503   PROT 8.1 10/30/2022 1503   ALBUMIN 4.1 10/30/2022 1503   AST 21 10/30/2022 1503   ALT 18 10/30/2022 1503   ALKPHOS 78 10/30/2022 1503   BILITOT 0.4 10/30/2022 1503   GFRNONAA >60 10/30/2022 1503     VAS [place] ABI WITH/WO TBI  Result Date: 05/01/2023  LOWER EXTREMITY DOPPLER STUDY Patient Name:  Gabriella Green  Date of Exam:   04/30/2023 Medical Rec #: [id number]       Accession #:    [id number] Date of Birth: 1951/06/25       Patient Gender: F Patient Age:   88 years Exam  Location:  [facility] Procedure:  VAS [place] ABI WITH/WO TBI Referring Phys: --------------------------------------------------------------------------------  Indications: Rest pain.  Performing Technologist: Salvadore Farber RVT  Examination Guidelines: A complete evaluation includes at minimum, Doppler waveform signals and systolic blood pressure reading at the level of bilateral brachial, anterior tibial, and posterior tibial arteries, when vessel segments are accessible. Bilateral testing is considered an integral part of a complete examination. Photoelectric Plethysmograph (PPG) waveforms and toe systolic pressure readings are included as required and additional duplex testing as needed. Limited examinations for reoccurring indications may be performed as noted.  ABI Findings: +---------+------------------+-----+---------+--------+ Right    Rt Pressure (mmHg)IndexWaveform Comment  +---------+------------------+-----+---------+--------+ Brachial 133                                      +---------+------------------+-----+---------+--------+ ATA      141               1.04 triphasic         +---------+------------------+-----+---------+--------+ PTA      145               1.07 triphasic         +---------+------------------+-----+---------+--------+ Great Toe125               0.93 Normal            +---------+------------------+-----+---------+--------+ +---------+------------------+-----+---------+-------+ Left     Lt Pressure (mmHg)IndexWaveform Comment +---------+------------------+-----+---------+-------+ Brachial 135                                     +---------+------------------+-----+---------+-------+ ATA      150               1.11 triphasic        +---------+------------------+-----+---------+-------+ PTA      146               1.08 triphasic        +---------+------------------+-----+---------+-------+ Great Toe142               1.05  Normal           +---------+------------------+-----+---------+-------+  Summary: Right: Resting right ankle-brachial index is within normal range. The right toe-brachial index is normal. Left: Resting left ankle-brachial index is within normal range. The left toe-brachial index is normal. *See table(s) above for measurements and observations.  Electronically signed by Festus Barren MD on 05/01/2023 at 9:32:11 AM.    Final        Assessment & Plan:   1. PVD (peripheral vascular disease) (HCC) Today noninvasive studies show that the patient does not have any evidence of peripheral arterial disease.  I suspect the pain in her legs is as result from her ongoing treatments for cancer.  In addition I also suspect that the redness that she has occasionally may be related to this 2.  She continues to smoke and I have advised her that that can affect her vascular system especially in the setting of ongoing cancer treatment.  Currently at this time she has normal for intervention.  She will follow-up with [place] on an as-needed basis.   Current Outpatient Medications on File Prior to Visit  Medication Sig Dispense Refill   anastrozole (ARIMIDEX) 1 MG tablet Take 1 tablet (1 mg total) by mouth daily. 90 tablet 1   aspirin EC 81 MG tablet Take 81 mg by mouth daily. Swallow  whole.     ondansetron (ZOFRAN-ODT) 4 MG disintegrating tablet Take 4 mg by mouth every 8 (eight) hours as needed.     pantoprazole (PROTONIX) 40 MG tablet Take 40 mg by mouth daily.     Vitamin D, Ergocalciferol, (DRISDOL) 1.25 MG (50000 UNIT) CAPS capsule Take 50,000 Units by mouth every 7 (seven) days.     No current facility-administered medications on file prior to visit.    There are no Patient Instructions on file for this visit. No follow-ups on file.   Georgiana Spinner, NP

## 2023-05-15 ENCOUNTER — Ambulatory Visit: Payer: No Typology Code available for payment source | Admitting: Surgery

## 2023-05-19 NOTE — Progress Notes (Unsigned)
[facility] POST-OP OFFICE VISIT  05/20/2023  HPI: Gabriella Green is a 71 y.o. female month s/p left simple mastectomy with sentinel lymph node biopsy.   Prior known small left seroma has resolved completely.  She denies any fevers or chills.  Denies any tenderness or pain.  She denies any diminished range of motion.    Vital signs: BP (!) 115/58   Pulse 86   Temp 98.2 F (36.8 C)   Ht 5\' 1"  (1.549 m)   Wt 82 lb 6.4 oz (37.4 kg)   SpO2 97%   BMI 15.57 kg/m    Physical Exam: Constitutional: She appears well, taking her Arimidex.  Skin: Left mastectomy site incision,  intact.  Skin flaps are doing well, and there is no evidence of seroma.   Completely nontender.  The skin overlying the area is healthy.  There are no subcutaneous or chest wall nodularity, or irregularity appreciated whatsoever.  Assessment/Plan: This is a 72 y.o. female  s/p simple left mastectomy with sentinel lymph node biopsy. Previously noted seroma resolved. Patient Active Problem List   Diagnosis Date Noted   Postoperative seroma of subcutaneous tissue after non-dermatologic procedure 12/18/2022   Status post bilateral mastectomy 11/19/2022   Carcinoma of upper-outer quadrant of left breast in female, estrogen receptor positive (HCC) 10/30/2022   History of bilateral breast cancer 03/02/2020   Encounter for screening mammogram for breast cancer 03/02/2020   Nausea    Colon cancer screening    Cancer (HCC)     -Will continue to follow her on a regular annual basis.  Campbell Lerner M.D., FACS 05/20/2023, 3:05 PM

## 2023-05-20 ENCOUNTER — Ambulatory Visit (INDEPENDENT_AMBULATORY_CARE_PROVIDER_SITE_OTHER): Payer: No Typology Code available for payment source | Admitting: Surgery

## 2023-05-20 ENCOUNTER — Encounter: Payer: Self-pay | Admitting: Surgery

## 2023-05-20 VITALS — BP 115/58 | HR 86 | Temp 98.2°F | Ht 61.0 in | Wt 82.4 lb

## 2023-05-20 DIAGNOSIS — C50412 Malignant neoplasm of upper-outer quadrant of left female breast: Secondary | ICD-10-CM

## 2023-05-20 DIAGNOSIS — Z9013 Acquired absence of bilateral breasts and nipples: Secondary | ICD-10-CM

## 2023-05-20 DIAGNOSIS — Z170 Estrogen receptor positive status [ER+]: Secondary | ICD-10-CM | POA: Diagnosis not present

## 2023-05-20 NOTE — Patient Instructions (Addendum)
Follow up in 1 year for an office visit and exam. We will send you a letter about this appointment.    Please call and ask to speak with a nurse if you develop questions or concerns.

## 2023-06-03 ENCOUNTER — Inpatient Hospital Stay: Payer: No Typology Code available for payment source | Attending: Internal Medicine | Admitting: Internal Medicine

## 2023-06-03 ENCOUNTER — Encounter: Payer: Self-pay | Admitting: Internal Medicine

## 2023-06-03 VITALS — BP 139/62 | HR 95 | Temp 97.9°F | Resp 18 | Wt 83.8 lb

## 2023-06-03 DIAGNOSIS — Z79811 Long term (current) use of aromatase inhibitors: Secondary | ICD-10-CM | POA: Diagnosis not present

## 2023-06-03 DIAGNOSIS — Z9013 Acquired absence of bilateral breasts and nipples: Secondary | ICD-10-CM | POA: Insufficient documentation

## 2023-06-03 DIAGNOSIS — M549 Dorsalgia, unspecified: Secondary | ICD-10-CM | POA: Insufficient documentation

## 2023-06-03 DIAGNOSIS — Z923 Personal history of irradiation: Secondary | ICD-10-CM | POA: Insufficient documentation

## 2023-06-03 DIAGNOSIS — Z803 Family history of malignant neoplasm of breast: Secondary | ICD-10-CM | POA: Insufficient documentation

## 2023-06-03 DIAGNOSIS — Z853 Personal history of malignant neoplasm of breast: Secondary | ICD-10-CM | POA: Insufficient documentation

## 2023-06-03 DIAGNOSIS — M2550 Pain in unspecified joint: Secondary | ICD-10-CM | POA: Diagnosis not present

## 2023-06-03 DIAGNOSIS — F17210 Nicotine dependence, cigarettes, uncomplicated: Secondary | ICD-10-CM | POA: Insufficient documentation

## 2023-06-03 DIAGNOSIS — Z170 Estrogen receptor positive status [ER+]: Secondary | ICD-10-CM | POA: Insufficient documentation

## 2023-06-03 DIAGNOSIS — Z9221 Personal history of antineoplastic chemotherapy: Secondary | ICD-10-CM | POA: Diagnosis not present

## 2023-06-03 DIAGNOSIS — C50412 Malignant neoplasm of upper-outer quadrant of left female breast: Secondary | ICD-10-CM | POA: Insufficient documentation

## 2023-06-03 MED ORDER — ANASTROZOLE 1 MG PO TABS: 1.0000 mg | ORAL_TABLET | Freq: Every day | ORAL | 1 refills | Status: DC

## 2023-06-03 NOTE — Progress Notes (Signed)
[facility] CONSULT NOTE  Patient Care Team: Revelo, Presley Raddle, MD as PCP - General (Family Medicine) Kieth Brightly, MD (General Surgery) Jim Like, RN as Registered Nurse Hulen Luster, RN as Oncology Nurse Navigator Earna Coder, MD as Consulting Physician (Oncology)  CHIEF COMPLAINTS/PURPOSE OF CONSULTATION: Breast cancer  #  Oncology History Overview Note  BRCA negative 2013    Breast cancer [facility]) 2005    left breast ca   Breast cancer [facility]) 2006, 1999    right breast ca   Cancer [facility]) [phone]   # Left breast- 1999 "mild ducts"- s/p lumpectomy; s/p RT- no chemo [Dr.Sankar/Dr.Byrnett]  # 2006 ? Stage- s/p right mastectomy s/p chemo [Dr.Choksi]; Dr.Chrystal     DIAGNOSIS: A.  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 Size of invasive carcinoma: 2 mm Mm in this sample Histologic grade of invasive carcinoma: Grade 2                      Glandular/tubular differentiation score: 3                      Nuclear pleomorphism score: 3                      Mitotic rate score: 1                      Total score: 7 Ductal carcinoma in situ: Present, high-grade comedo type with associated calcifications Lymphovascular invasion: Not identified  Comment: The definitive grade will be assigned on the excisional specimen. ER/PR/HER2: Immunohistochemistry will be performed on block A2, with reflex to FISH for HER2 2+. The results will be reported in an addendum.  GROSS DESCRIPTION: A. Labeled: Left breast stereo biopsy calcs upper outer quadrant mid depth Received: in a formalin-filled [place]a collection device Specimen radiograph image(s) available for review Time/Date in fixative: Collected at 10:58 AM on 10/23/2022 and placed in formalin at 11:01 AM on 10/23/2022 Cold ischemic time: Less than 5 minutes Total fixation time: Approximately 9.25  hours Core pieces: Multiple Measurement: Aggregate, 8.5 x 1.5 x 0.3 cm Description / comments: Received are cores and fragments of yellow fibrofatty tissue.  The accompanying diagram has sections B and G checked. Inked: Green Entirely submitted in cassette(s):  1 - section B 2 - section G 3 - 7 - remaining tissue fragments      3 - sections A and C      4 - sections D and E      5 - sections F and H      6 - sections I and J      7 - sections K and L with remaining free-floating fragments  RB 10/23/2022  Final Diagnosis performed by Elijah Birk, MD.   Electronically signed 10/24/2022 10:40:25AM The electronic signature indicates that the named Attending Pathologist has evaluated the specimen Technical component performed at [place], [address] [place], [place] [address] Lab: [phone] Dir: Jolene Schimke, MD, MMM  Professional component performed at [facility], [facility], [address] [place], [place], [place] [address] Lab: [phone] Dir: Beryle Quant, MD  ADDENDUM: CASE SUMMARY: BREAST BIOMARKER TESTS Estrogen Receptor (ER) Status: POSITIVE         Percentage of cells with nuclear positivity: Greater than 90%         Average intensity  of staining: Strong  Progesterone Receptor (PgR) Status: NEGATIVE (LESS THAN 1%)         Internal control cells present and stain as expected  HER2 (by immunohistochemistry): NEGATIVE (Score 1+)  Histologic Type: Invasive carcinoma of no special type (ductal) Histologic Grade (Not[place] Histologic Score)      Glandular (Acinar)/Tubular Differentiation: 3      Nuclear Pleomorphism: 3      Mitotic Rate: 3      Overall Grade: Grade 3 Tumor Size: At least 17 mm Tumor Focality: Single focus of invasive carcinoma Ductal Carcinoma In Situ (DCIS): Present      Positive for extensive intraductal component (EIC) Tumor Extent: Not applicable Lymphatic and/or Vascular Invasion: Not identified  Treatment Effect in the  Breast: No known presurgical therapy  MARGINS Margin Status for Invasive Carcinoma: All margins negative for invasive carcinoma   APRIL/MAY 2024-left breast invasive mammary carcinoma s/p mastectomy [Dr. Rodenberg]-at least pT1c [17 mm]; 2 sentinel lymph nodes negative; grade 3; Oncotype 35 high risk-declined chemotherapy.      Carcinoma of upper-outer quadrant of left breast in female, estrogen receptor positive (HCC)  10/30/2022 Initial Diagnosis   Carcinoma of upper-outer quadrant of left breast in female, estrogen receptor positive   10/30/2022 Cancer Staging   Staging form: Breast, AJCC 8th Edition - Clinical: Stage IA (cT1a, cN0, cM0, G2, ER+, PR-, HER2-) - Signed by Earna Coder, MD on 10/30/2022 Histologic grading system: 3 grade system   12/05/2022 - 12/05/2022 Chemotherapy   Patient is on Treatment Plan : BREAST TC q21d      HISTORY OF PRESENTING ILLNESS: Patient ambulating-independently.  Alone.   Gabriella Green 72 y.o.  female patient with a history of metachronous breast cancer -most recently left-sided ER/PR positive HER2 negative stage I s/p mastectomy-high risk Oncotype declined chemo currently on anastrzole.   Patient doing well no concerns re Breast Cancer. Taking anastrazole with no side effects. Pt continues working 2 jobs.  Patient is recovering well from surgery.  She denies any worsening swelling or pain.   Review of Systems  Constitutional:  Negative for chills, diaphoresis, fever, malaise/fatigue and weight loss.  HENT:  Negative for nosebleeds and sore throat.   Eyes:  Negative for double vision.  Respiratory:  Positive for cough. Negative for hemoptysis, sputum production, shortness of breath and wheezing.   Cardiovascular:  Negative for chest pain, palpitations, orthopnea and leg swelling.  Gastrointestinal:  Negative for abdominal pain, blood in stool, constipation, diarrhea, heartburn, melena, nausea and vomiting.  Genitourinary:  Negative for  dysuria, frequency and urgency.  Musculoskeletal:  Positive for back pain and joint pain.  Skin: Negative.  Negative for itching and rash.  Neurological:  Negative for dizziness, tingling, focal weakness, weakness and headaches.  Endo/Heme/Allergies:  Does not bruise/bleed easily.  Psychiatric/Behavioral:  Negative for depression. The patient is not nervous/anxious and does not have insomnia.      MEDICAL HISTORY:  Past Medical History:  Diagnosis Date   BRCA negative 2013   Breast cancer [facility]) 2005   left breast ca   Breast cancer (HCC) 2006, 1999   right breast ca   Cancer [facility]) [phone]   right mastectomy and left breast lumpectomy   Personal history of chemotherapy    2006 rt breast ca   Personal history of radiation therapy    PONV (postoperative nausea and vomiting)     SURGICAL HISTORY: Past Surgical History:  Procedure Laterality Date   BREAST BIOPSY Left 2019  ADENOSIS, SKELETAL MUSCLE of LEFT breast   BREAST BIOPSY Left 10/23/2022   Left Breast Stereo Bx, X clip - path pending   BREAST BIOPSY Left 10/23/2022   MM LT BREAST BX W LOC DEV 1ST LESION IMAGE BX SPEC STEREO GUIDE 10/23/2022 [facility]   BREAST CYST ASPIRATION Bilateral    BREAST EXCISIONAL BIOPSY Right 1999   rad breast ca   BREAST EXCISIONAL BIOPSY Left 2005   rad breast ca   BREAST EXCISIONAL BIOPSY Right 2006   mastectomy chemo    BREAST LUMPECTOMY Left 2005   1999 right lumpectomy   CHOLECYSTECTOMY  2002   COLONOSCOPY  2009   COLONOSCOPY WITH PROPOFOL N/A 09/03/2018   Procedure: COLONOSCOPY WITH PROPOFOL;  Surgeon: Toney Reil, MD;  Location: [facility] ENDOSCOPY;  Service: Gastroenterology;  Laterality: N/A;   ESOPHAGOGASTRODUODENOSCOPY (EGD) WITH PROPOFOL N/A 09/03/2018   Procedure: ESOPHAGOGASTRODUODENOSCOPY (EGD) WITH PROPOFOL;  Surgeon: Toney Reil, MD;  Location: [facility] ENDOSCOPY;  Service: Gastroenterology;  Laterality: N/A;   MASTECTOMY Right 2006   MASTECTOMY W/ SENTINEL  NODE BIOPSY Left 11/13/2022   Procedure: MASTECTOMY WITH SENTINEL LYMPH NODE BIOPSY, total mastectomy;  Surgeon: Campbell Lerner, MD;  Location: [facility] ORS;  Service: General;  Laterality: Left;    SOCIAL HISTORY: Social History   Socioeconomic History   Marital status: Widowed    Spouse name: Not on file   Number of children: Not on file   Years of education: Not on file   Highest education level: Not on file  Occupational History   Not on file  Tobacco Use   Smoking status: Every Day    Current packs/day: 1.00    Average packs/day: 1 pack/day for 20.0 years (20.0 ttl pk-yrs)    Types: Cigarettes    Passive exposure: Past   Smokeless tobacco: Never  Vaping Use   Vaping status: Never Used  Substance and Sexual Activity   Alcohol use: No   Drug use: No   Sexual activity: Not on file  Other Topics Concern   Not on file  Social History Narrative   Not on file   Social Determinants of Health   Financial Resource Strain: Not on file  Food Insecurity: No Food Insecurity (10/30/2022)   Hunger Vital Sign    Worried About Running Out of Food in the Last Year: Never true    Ran Out of Food in the Last Year: Never true  Transportation Needs: No Transportation Needs (10/30/2022)   PRAPARE - Administrator, Civil Service (Medical): No    Lack of Transportation (Non-Medical): No  Physical Activity: Not on file  Stress: Not on file  Social Connections: Not on file  Intimate Partner Violence: Not on file    FAMILY HISTORY: Family History  Problem Relation Age of Onset   Breast cancer Mother 54   Breast cancer Sister 78   Breast cancer Maternal Aunt 40    ALLERGIES:  is allergic to codeine and tape.  MEDICATIONS:  Current Outpatient Medications  Medication Sig Dispense Refill   aspirin EC 81 MG tablet Take 81 mg by mouth daily. Swallow whole.     pantoprazole (PROTONIX) 40 MG tablet Take 40 mg by mouth daily.     Vitamin D, Ergocalciferol, (DRISDOL) 1.25 MG  (50000 UNIT) CAPS capsule Take 50,000 Units by mouth every 7 (seven) days.     anastrozole (ARIMIDEX) 1 MG tablet Take 1 tablet (1 mg total) by mouth daily. 90 tablet 1   ondansetron (ZOFRAN-ODT)  4 MG disintegrating tablet Take 4 mg by mouth every 8 (eight) hours as needed. (Patient not taking: Reported on 06/03/2023)     No current facility-administered medications for this visit.      Marland Kitchen  PHYSICAL EXAMINATION:   Vitals:   06/03/23 1506  BP: 139/62  Pulse: 95  Resp: 18  Temp: 97.9 F (36.6 C)  SpO2: 100%     Filed Weights   06/03/23 1506  Weight: 83 lb 12.8 oz (38 kg)      Physical Exam Vitals and nursing note reviewed.  HENT:     Head: Normocephalic and atraumatic.     Mouth/Throat:     Pharynx: Oropharynx is clear.  Eyes:     Extraocular Movements: Extraocular movements intact.     Pupils: Pupils are equal, round, and reactive to light.  Cardiovascular:     Rate and Rhythm: Normal rate and regular rhythm.  Pulmonary:     Comments: Decreased breath sounds bilaterally.  Abdominal:     Palpations: Abdomen is soft.  Musculoskeletal:        General: Normal range of motion.     Cervical back: Normal range of motion.  Skin:    General: Skin is warm.  Neurological:     General: No focal deficit present.     Mental Status: She is alert and oriented to person, place, and time.  Psychiatric:        Behavior: Behavior normal.        Judgment: Judgment normal.      LABORATORY DATA:  I have reviewed the data as listed Lab Results  Component Value Date   WBC 8.5 10/30/2022   HGB 13.8 10/30/2022   HCT 41.1 10/30/2022   MCV 92.4 10/30/2022   PLT 380 10/30/2022   Recent Labs    10/30/22 1503  NA 138  K 4.0  CL 104  CO2 26  GLUCOSE 98  BUN 17  CREATININE 0.54  CALCIUM 9.4  GFRNONAA >60  PROT 8.1  ALBUMIN 4.1  AST 21  ALT 18  ALKPHOS 78  BILITOT 0.4    RADIOGRAPHIC STUDIES: I have personally reviewed the radiological images as listed and  agreed with the findings in the report. No results found.  ASSESSMENT & PLAN:   Carcinoma of upper-outer quadrant of left breast in female, estrogen receptor positive (HCC) # APRIL 2024-left breast  INVASIVE MAMMARY CARCINOMA, s/p mastectomy [Dr.Rodenberg]; 1999-? DCIS s/p lumpectomy/radiation;Dr.Sankar] pT1c [9mm ]pN0; ER-positive PR negative HER2/neu negative; G-3; ONCOTYPE  RS-32 high risk of recurrence.  Patiet declined Chemo.   # Patient currently on anastrozole.  Recommend anastrozole for 10 years. Tolerating well.    # Bone health- recommend BMD; but declines- recommend calcium 1200 mg/day; along with high dose vit D.   #  Genetics: Given her young age of first diagnosis and bilateral breast cancers. 2013- BRCA 1 &2- NEG-however consider expanded testing; patient declines any further evaluation.   # active smoker: < 1ppd;   # DISPOSITION: # follow up in 6 months- MD; labs- cbc/cmp; vit D 25-OH Dr. B   All questions were answered. The patient/family knows to call the clinic with any problems, questions or concerns.    Earna Coder, MD 06/03/2023 3:58 PM

## 2023-06-03 NOTE — Assessment & Plan Note (Addendum)
#   APRIL 2024-left breast  INVASIVE MAMMARY CARCINOMA, s/p mastectomy [Dr.Rodenberg]; 1999-? DCIS s/p lumpectomy/radiation;Dr.Sankar] pT1c [46mm ]pN0; ER-positive PR negative HER2/neu negative; G-3; ONCOTYPE  RS-32 high risk of recurrence.  Patiet declined Chemo.   # Patient currently on anastrozole.  Recommend anastrozole for 10 years. Tolerating well.    # Bone health- recommend BMD; but declines- recommend calcium 1200 mg/day; along with high dose vit D.   #  Genetics: Given her young age of first diagnosis and bilateral breast cancers. 2013- BRCA 1 &2- NEG-however consider expanded testing; patient declines any further evaluation.   # active smoker: < 1ppd;   # DISPOSITION: # follow up in 6 months- MD; labs- cbc/cmp; vit D 25-OH Dr. Leonard Schwartz

## 2023-06-03 NOTE — Progress Notes (Signed)
Doing well no concerns re Breast Cancer. Taking anastrazole with no side effects. Pt continues working 2 jobs. Works all the time.

## 2023-07-22 ENCOUNTER — Other Ambulatory Visit: Payer: No Typology Code available for payment source

## 2023-11-30 ENCOUNTER — Other Ambulatory Visit: Payer: Self-pay | Admitting: Internal Medicine

## 2023-12-08 ENCOUNTER — Inpatient Hospital Stay: Payer: No Typology Code available for payment source

## 2023-12-08 ENCOUNTER — Encounter: Payer: Self-pay | Admitting: Internal Medicine

## 2023-12-08 ENCOUNTER — Inpatient Hospital Stay: Payer: No Typology Code available for payment source | Attending: Internal Medicine | Admitting: Internal Medicine

## 2023-12-08 VITALS — BP 108/55 | HR 74 | Temp 99.4°F | Resp 18 | Ht 61.0 in | Wt 82.9 lb

## 2023-12-08 DIAGNOSIS — Z79811 Long term (current) use of aromatase inhibitors: Secondary | ICD-10-CM | POA: Diagnosis not present

## 2023-12-08 DIAGNOSIS — Z9013 Acquired absence of bilateral breasts and nipples: Secondary | ICD-10-CM | POA: Insufficient documentation

## 2023-12-08 DIAGNOSIS — Z1732 Human epidermal growth factor receptor 2 negative status: Secondary | ICD-10-CM | POA: Insufficient documentation

## 2023-12-08 DIAGNOSIS — Z9221 Personal history of antineoplastic chemotherapy: Secondary | ICD-10-CM | POA: Insufficient documentation

## 2023-12-08 DIAGNOSIS — F17210 Nicotine dependence, cigarettes, uncomplicated: Secondary | ICD-10-CM | POA: Insufficient documentation

## 2023-12-08 DIAGNOSIS — Z1722 Progesterone receptor negative status: Secondary | ICD-10-CM | POA: Diagnosis not present

## 2023-12-08 DIAGNOSIS — C50412 Malignant neoplasm of upper-outer quadrant of left female breast: Secondary | ICD-10-CM | POA: Insufficient documentation

## 2023-12-08 DIAGNOSIS — Z170 Estrogen receptor positive status [ER+]: Secondary | ICD-10-CM | POA: Insufficient documentation

## 2023-12-08 LAB — CBC WITH DIFFERENTIAL (CANCER CENTER ONLY)
Abs Immature Granulocytes: 0.03 10*3/uL (ref 0.00–0.07)
Basophils Absolute: 0.1 10*3/uL (ref 0.0–0.1)
Basophils Relative: 1 %
Eosinophils Absolute: 0.1 10*3/uL (ref 0.0–0.5)
Eosinophils Relative: 2 %
HCT: 40.7 % (ref 36.0–46.0)
Hemoglobin: 13.5 g/dL (ref 12.0–15.0)
Immature Granulocytes: 0 %
Lymphocytes Relative: 26 %
Lymphs Abs: 2.1 10*3/uL (ref 0.7–4.0)
MCH: 30.5 pg (ref 26.0–34.0)
MCHC: 33.2 g/dL (ref 30.0–36.0)
MCV: 91.9 fL (ref 80.0–100.0)
Monocytes Absolute: 0.8 10*3/uL (ref 0.1–1.0)
Monocytes Relative: 10 %
Neutro Abs: 4.9 10*3/uL (ref 1.7–7.7)
Neutrophils Relative %: 61 %
Platelet Count: 390 10*3/uL (ref 150–400)
RBC: 4.43 MIL/uL (ref 3.87–5.11)
RDW: 13.5 % (ref 11.5–15.5)
WBC Count: 7.9 10*3/uL (ref 4.0–10.5)
nRBC: 0 % (ref 0.0–0.2)

## 2023-12-08 LAB — CMP (CANCER CENTER ONLY)
ALT: 12 U/L (ref 0–44)
AST: 17 U/L (ref 15–41)
Albumin: 3.7 g/dL (ref 3.5–5.0)
Alkaline Phosphatase: 78 U/L (ref 38–126)
Anion gap: 8 (ref 5–15)
BUN: 15 mg/dL (ref 8–23)
CO2: 28 mmol/L (ref 22–32)
Calcium: 9.3 mg/dL (ref 8.9–10.3)
Chloride: 102 mmol/L (ref 98–111)
Creatinine: 0.76 mg/dL (ref 0.44–1.00)
GFR, Estimated: >60 mL/min (ref >60)
Glucose, Bld: 130 mg/dL — ABNORMAL HIGH (ref 70–99)
Potassium: 3.8 mmol/L (ref 3.5–5.1)
Sodium: 138 mmol/L (ref 135–145)
Total Bilirubin: 0.4 mg/dL (ref 0.0–1.2)
Total Protein: 7.7 g/dL (ref 6.5–8.1)

## 2023-12-08 LAB — VITAMIN D 25 HYDROXY (VIT D DEFICIENCY, FRACTURES): Vit D, 25-Hydroxy: 139.55 ng/mL — ABNORMAL HIGH (ref 30–100)

## 2023-12-08 NOTE — Progress Notes (Signed)
 Appetite 25% normal, boost occasionally.  Pt asking why it matters what arm she gets her BP in since she had lymph nodes taken under both axilla?

## 2023-12-08 NOTE — Assessment & Plan Note (Addendum)
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  Patiet declined Chemo.  Hx of Right mastectomy in 2006- s/p BIL MASTECTOMY- NO Mammograms.  # Patient currently on anastrozole .  Recommend anastrozole  for 10 years. Tolerating well.  Stable.   # Bone health- recommend BMD; but declines- recommend calcium 1200 mg/day; along with high dose vit D.   # active smoker: < 1ppd; recommend quitting-   # DISPOSITION: # follow up in 6 months- MD; labs- cbc/cmp; vit D 25-OH Dr. Geraldene Kleine

## 2023-12-08 NOTE — Progress Notes (Signed)
 [facility] CONSULT NOTE  Patient Care Team: Revelo, Alyn Babe, MD as PCP - General (Family Medicine) Jerlean Mood, MD (General Surgery) Burnie Cartwright, RN as Registered Nurse Waverly Hageman, RN as Oncology Nurse Navigator Gwyn Leos, MD as Consulting Physician (Oncology)  CHIEF COMPLAINTS/PURPOSE OF CONSULTATION: Breast cancer  #  Oncology History Overview Note  BRCA negative 2013    Breast cancer [facility]) 2005    left breast ca   Breast cancer [facility]) 2006, 1999    right breast ca   Cancer [facility]) [phone]   # Left breast- 1999 "mild ducts"- s/p lumpectomy; s/p RT- no chemo [Dr.Sankar/Dr.Byrnett]  # 2006 ? Stage- s/p right mastectomy s/p chemo [Dr.Choksi]; Dr.Chrystal     DIAGNOSIS: A.  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 Size of invasive carcinoma: 2 mm Mm in this sample Histologic grade of invasive carcinoma: Grade 2                      Glandular/tubular differentiation score: 3                      Nuclear pleomorphism score: 3                      Mitotic rate score: 1                      Total score: 7 Ductal carcinoma in situ: Present, high-grade comedo type with associated calcifications Lymphovascular invasion: Not identified  Comment: The definitive grade will be assigned on the excisional specimen. ER/PR/HER2: Immunohistochemistry will be performed on block A2, with reflex to FISH for HER2 2+. The results will be reported in an addendum.  GROSS DESCRIPTION: A. Labeled: Left breast stereo biopsy calcs upper outer quadrant mid depth Received: in a formalin-filled Brevera collection device Specimen radiograph image(s) available for review Time/Date in fixative: Collected at 10:58 AM on 10/23/2022 and placed in formalin at 11:01 AM on 10/23/2022 Cold ischemic time: Less than 5 minutes Total fixation time: Approximately 9.25  hours Core pieces: Multiple Measurement: Aggregate, 8.5 x 1.5 x 0.3 cm Description / comments: Received are cores and fragments of yellow fibrofatty tissue.  The accompanying diagram has sections B and G checked. Inked: Green Entirely submitted in cassette(s):  1 - section B 2 - section G 3 - 7 - remaining tissue fragments      3 - sections A and C      4 - sections D and E      5 - sections F and H      6 - sections I and J      7 - sections K and L with remaining free-floating fragments  RB 10/23/2022  Final Diagnosis performed by Teresia Fennel, MD.   Electronically signed 10/24/2022 10:40:25AM The electronic signature indicates that the named Attending Pathologist has evaluated the specimen Technical component performed at [place], [address], [place], [place] [address] Lab: [phone] Dir: Pearlean Botts, MD, MMM  Professional component performed at [facility], [facility], [address] [place], [place], [place] [address] Lab: [phone] Dir: Colton Dearth, MD  ADDENDUM: CASE SUMMARY: BREAST BIOMARKER TESTS Estrogen Receptor (ER) Status: POSITIVE         Percentage of cells with nuclear positivity: Greater than 90%         Average intensity  of staining: Strong  Progesterone Receptor (PgR) Status: NEGATIVE (LESS THAN 1%)         Internal control cells present and stain as expected  HER2 (by immunohistochemistry): NEGATIVE (Score 1+)  Histologic Type: Invasive carcinoma of no special type (ductal) Histologic Grade (Nottingham Histologic Score)      Glandular (Acinar)/Tubular Differentiation: 3      Nuclear Pleomorphism: 3      Mitotic Rate: 3      Overall Grade: Grade 3 Tumor Size: At least 17 mm Tumor Focality: Single focus of invasive carcinoma Ductal Carcinoma In Situ (DCIS): Present      Positive for extensive intraductal component (EIC) Tumor Extent: Not applicable Lymphatic and/or Vascular Invasion: Not identified  Treatment Effect in the  Breast: No known presurgical therapy  MARGINS Margin Status for Invasive Carcinoma: All margins negative for invasive carcinoma   APRIL/MAY 2024-left breast invasive mammary carcinoma s/p mastectomy [Dr. Rodenberg]-at least pT1c [17 mm]; 2 sentinel lymph nodes negative; grade 3; Oncotype 35 high risk-declined chemotherapy.      Carcinoma of upper-outer quadrant of left breast in female, estrogen receptor positive (HCC)  10/30/2022 Initial Diagnosis   Carcinoma of upper-outer quadrant of left breast in female, estrogen receptor positive   10/30/2022 Cancer Staging   Staging form: Breast, AJCC 8th Edition - Clinical: Stage IA (cT1a, cN0, cM0, G2, ER+, PR-, HER2-) - Signed by Gwyn Leos, MD on 10/30/2022 Histologic grading system: 3 grade system   12/05/2022 - 12/05/2022 Chemotherapy   Patient is on Treatment Plan : BREAST TC q21d      HISTORY OF PRESENTING ILLNESS: Patient ambulating-independently.  Alone.   Gabriella Green 73 y.o.  female patient with a history of metachronous breast cancer -most recently left-sided ER/PR positive HER2 negative stage I s/p mastectomy-high risk Oncotype declined chemo currently on anastrzole.   Appetite 25% normal, boost occasionally. Pt asking why it matters what arm she gets her BP in since she had lymph nodes taken under both axilla  Patient doing well no concerns re Breast Cancer. Taking anastrazole with no side effects. Pt continues working 2 jobs.  Patient is recovering well from surgery.  She denies any worsening swelling or pain.   Review of Systems  Constitutional:  Negative for chills, diaphoresis, fever, malaise/fatigue and weight loss.  HENT:  Negative for nosebleeds and sore throat.   Eyes:  Negative for double vision.  Respiratory:  Positive for cough. Negative for hemoptysis, sputum production, shortness of breath and wheezing.   Cardiovascular:  Negative for chest pain, palpitations, orthopnea and leg swelling.   Gastrointestinal:  Negative for abdominal pain, blood in stool, constipation, diarrhea, heartburn, melena, nausea and vomiting.  Genitourinary:  Negative for dysuria, frequency and urgency.  Musculoskeletal:  Positive for back pain and joint pain.  Skin: Negative.  Negative for itching and rash.  Neurological:  Negative for dizziness, tingling, focal weakness, weakness and headaches.  Endo/Heme/Allergies:  Does not bruise/bleed easily.  Psychiatric/Behavioral:  Negative for depression. The patient is not nervous/anxious and does not have insomnia.      MEDICAL HISTORY:  Past Medical History:  Diagnosis Date   BRCA negative 2013   Breast cancer [facility]) 2005   left breast ca   Breast cancer (HCC) 2006, 1999   right breast ca   Cancer [facility]) [phone]   right mastectomy and left breast lumpectomy   Personal history of chemotherapy    2006 rt breast ca   Personal history of radiation therapy  PONV (postoperative nausea and vomiting)     SURGICAL HISTORY: Past Surgical History:  Procedure Laterality Date   BREAST BIOPSY Left 2019    ADENOSIS, SKELETAL MUSCLE of LEFT breast   BREAST BIOPSY Left 10/23/2022   Left Breast Stereo Bx, X clip - path pending   BREAST BIOPSY Left 10/23/2022   MM LT BREAST BX W LOC DEV 1ST LESION IMAGE BX SPEC STEREO GUIDE 10/23/2022 [facility]   BREAST CYST ASPIRATION Bilateral    BREAST EXCISIONAL BIOPSY Right 1999   rad breast ca   BREAST EXCISIONAL BIOPSY Left 2005   rad breast ca   BREAST EXCISIONAL BIOPSY Right 2006   mastectomy chemo    BREAST LUMPECTOMY Left 2005   1999 right lumpectomy   CHOLECYSTECTOMY  2002   COLONOSCOPY  2009   COLONOSCOPY WITH PROPOFOL  N/A 09/03/2018   Procedure: COLONOSCOPY WITH PROPOFOL ;  Surgeon: Selena Daily, MD;  Location: [facility] ENDOSCOPY;  Service: Gastroenterology;  Laterality: N/A;   ESOPHAGOGASTRODUODENOSCOPY (EGD) WITH PROPOFOL  N/A 09/03/2018   Procedure: ESOPHAGOGASTRODUODENOSCOPY (EGD) WITH  PROPOFOL ;  Surgeon: Selena Daily, MD;  Location: [facility] ENDOSCOPY;  Service: Gastroenterology;  Laterality: N/A;   MASTECTOMY Right 2006   MASTECTOMY W/ SENTINEL NODE BIOPSY Left 11/13/2022   Procedure: MASTECTOMY WITH SENTINEL LYMPH NODE BIOPSY, total mastectomy;  Surgeon: Flynn Hylan, MD;  Location: [facility] ORS;  Service: General;  Laterality: Left;    SOCIAL HISTORY: Social History   Socioeconomic History   Marital status: Widowed    Spouse name: Not on file   Number of children: Not on file   Years of education: Not on file   Highest education level: Not on file  Occupational History   Not on file  Tobacco Use   Smoking status: Every Day    Current packs/day: 1.00    Average packs/day: 1 pack/day for 20.0 years (20.0 ttl pk-yrs)    Types: Cigarettes    Passive exposure: Past   Smokeless tobacco: Never  Vaping Use   Vaping status: Never Used  Substance and Sexual Activity   Alcohol use: No   Drug use: No   Sexual activity: Not on file  Other Topics Concern   Not on file  Social History Narrative   Not on file   Social Drivers of Health   Financial Resource Strain: Not on file  Food Insecurity: No Food Insecurity (10/30/2022)   Hunger Vital Sign    Worried About Running Out of Food in the Last Year: Never true    Ran Out of Food in the Last Year: Never true  Transportation Needs: No Transportation Needs (10/30/2022)   PRAPARE - Administrator, Civil Service (Medical): No    Lack of Transportation (Non-Medical): No  Physical Activity: Not on file  Stress: Not on file  Social Connections: Not on file  Intimate Partner Violence: Not on file    FAMILY HISTORY: Family History  Problem Relation Age of Onset   Breast cancer Mother 17   Breast cancer Sister 63   Breast cancer Maternal Aunt 40    ALLERGIES:  is allergic to codeine and tape.  MEDICATIONS:  Current Outpatient Medications  Medication Sig Dispense Refill   anastrozole  (ARIMIDEX ) 1  MG tablet TAKE 1 TABLET(1 MG) BY MOUTH DAILY 90 tablet 1   aspirin EC 81 MG tablet Take 81 mg by mouth daily. Swallow whole.     ondansetron  (ZOFRAN -ODT) 4 MG disintegrating tablet Take 4 mg by mouth every 8 (eight) hours  as needed.     Vitamin D , Ergocalciferol , (DRISDOL ) 1.25 MG (50000 UNIT) CAPS capsule Take 50,000 Units by mouth every 7 (seven) days.     No current facility-administered medications for this visit.      Aaron Aas  PHYSICAL EXAMINATION:   Vitals:   12/08/23 1502  BP: (!) 108/55  Pulse: 74  Resp: 18  Temp: 99.4 F (37.4 C)  SpO2: 99%      Filed Weights   12/08/23 1502  Weight: 82 lb 14.4 oz (37.6 kg)       Physical Exam Vitals and nursing note reviewed.  HENT:     Head: Normocephalic and atraumatic.     Mouth/Throat:     Pharynx: Oropharynx is clear.  Eyes:     Extraocular Movements: Extraocular movements intact.     Pupils: Pupils are equal, round, and reactive to light.  Cardiovascular:     Rate and Rhythm: Normal rate and regular rhythm.  Pulmonary:     Comments: Decreased breath sounds bilaterally.  Abdominal:     Palpations: Abdomen is soft.  Musculoskeletal:        General: Normal range of motion.     Cervical back: Normal range of motion.  Skin:    General: Skin is warm.  Neurological:     General: No focal deficit present.     Mental Status: She is alert and oriented to person, place, and time.  Psychiatric:        Behavior: Behavior normal.        Judgment: Judgment normal.      LABORATORY DATA:  I have reviewed the data as listed Lab Results  Component Value Date   WBC 7.9 12/08/2023   HGB 13.5 12/08/2023   HCT 40.7 12/08/2023   MCV 91.9 12/08/2023   PLT 390 12/08/2023   Recent Labs    12/08/23 1503  NA 138  K 3.8  CL 102  CO2 28  GLUCOSE 130*  BUN 15  CREATININE 0.76  CALCIUM 9.3  GFRNONAA >60  PROT 7.7  ALBUMIN 3.7  AST 17  ALT 12  ALKPHOS 78  BILITOT 0.4     RADIOGRAPHIC STUDIES: I have personally  reviewed the radiological images as listed and agreed with the findings in the report. No results found.  ASSESSMENT & PLAN:   Carcinoma of upper-outer quadrant of left breast in female, estrogen receptor positive (HCC) #  1999-? DCIS s/p lumpectomy/radiation;Dr.Sankar]-; # APRIL 2024-left breast  INVASIVE MAMMARY CARCINOMA, s/p mastectomy [Dr.Rodenberg]; pT1c [91mm ]pN0; ER-positive PR negative HER2/neu negative; G-3; ONCOTYPE  RS-32 high risk of recurrence.  Patiet declined Chemo.  Hx of Right mastectomy in 2006- s/p BIL MASTECTOMY- NO Mammograms.  # Patient currently on anastrozole .  Recommend anastrozole  for 10 years. Tolerating well.  Stable.   # Bone health- recommend BMD; but declines- recommend calcium 1200 mg/day; along with high dose vit D.   # active smoker: < 1ppd; recommend quitting-   # DISPOSITION: # follow up in 6 months- MD; labs- cbc/cmp; vit D 25-OH Dr. B   All questions were answered. The patient/family knows to call the clinic with any problems, questions or concerns.    Gwyn Leos, MD 12/08/2023 3:47 PM

## 2023-12-09 ENCOUNTER — Ambulatory Visit: Payer: Self-pay | Admitting: Internal Medicine

## 2023-12-09 NOTE — Progress Notes (Signed)
 Hi-please inform patient that her vitamin D  levels are high-recommend holding vitamin D  and calcium supplementation at this time.  Make sure she has vitamin D  levels checked at next visit.  Recommend follow-up as planned. Thanks GB

## 2023-12-29 ENCOUNTER — Other Ambulatory Visit: Payer: Self-pay | Admitting: Family Medicine

## 2023-12-29 DIAGNOSIS — Z122 Encounter for screening for malignant neoplasm of respiratory organs: Secondary | ICD-10-CM

## 2023-12-29 DIAGNOSIS — Z13820 Encounter for screening for osteoporosis: Secondary | ICD-10-CM

## 2024-01-19 ENCOUNTER — Ambulatory Visit

## 2024-01-19 ENCOUNTER — Ambulatory Visit
Admission: RE | Admit: 2024-01-19 | Discharge: 2024-01-19 | Disposition: A | Discharge status: Home / Self Care | Source: Ambulatory Visit | Attending: Family Medicine | Admitting: Family Medicine

## 2024-01-19 DIAGNOSIS — Z122 Encounter for screening for malignant neoplasm of respiratory organs: Secondary | ICD-10-CM | POA: Diagnosis present

## 2024-01-19 DIAGNOSIS — F17210 Nicotine dependence, cigarettes, uncomplicated: Secondary | ICD-10-CM | POA: Diagnosis not present

## 2024-01-19 DIAGNOSIS — Z9221 Personal history of antineoplastic chemotherapy: Secondary | ICD-10-CM | POA: Insufficient documentation

## 2024-01-19 DIAGNOSIS — I700 Atherosclerosis of aorta: Secondary | ICD-10-CM | POA: Insufficient documentation

## 2024-01-19 DIAGNOSIS — Z853 Personal history of malignant neoplasm of breast: Secondary | ICD-10-CM | POA: Diagnosis not present

## 2024-01-19 DIAGNOSIS — Z9013 Acquired absence of bilateral breasts and nipples: Secondary | ICD-10-CM | POA: Diagnosis not present

## 2024-01-19 DIAGNOSIS — Z923 Personal history of irradiation: Secondary | ICD-10-CM | POA: Insufficient documentation

## 2024-01-19 DIAGNOSIS — R918 Other nonspecific abnormal finding of lung field: Secondary | ICD-10-CM | POA: Diagnosis not present

## 2024-01-19 DIAGNOSIS — J439 Emphysema, unspecified: Secondary | ICD-10-CM | POA: Diagnosis not present

## 2024-02-05 ENCOUNTER — Telehealth: Payer: Self-pay

## 2024-02-05 NOTE — Telephone Encounter (Signed)
 Per Dr. Tamea- ok to schedule tomorrow.  I have scheduled her an appt for tomorrow at 10:30am and I have notified the patient.  Nothing further needed.

## 2024-02-05 NOTE — Telephone Encounter (Signed)
 Copied from CRM [id number]. Topic: Appointments - Appointment Scheduling >> Feb 05, 2024  4:38 PM Shona S wrote: Patient/patient representative is calling to schedule an appointment. Refer to attachments for appointment information.  Please cal patient to schedule her much sooner, she had her lung cancer screening and received a concerning diagnosis and needs to speak with someone from our  office before 09/10. Please contact.

## 2024-02-06 ENCOUNTER — Telehealth: Payer: Self-pay

## 2024-02-06 ENCOUNTER — Encounter: Payer: Self-pay | Admitting: Pulmonary Disease

## 2024-02-06 ENCOUNTER — Ambulatory Visit (INDEPENDENT_AMBULATORY_CARE_PROVIDER_SITE_OTHER): Admitting: Pulmonary Disease

## 2024-02-06 VITALS — BP 124/70 | HR 92 | Temp 97.1°F | Ht 61.0 in | Wt 81.6 lb

## 2024-02-06 DIAGNOSIS — J439 Emphysema, unspecified: Secondary | ICD-10-CM

## 2024-02-06 DIAGNOSIS — J449 Chronic obstructive pulmonary disease, unspecified: Secondary | ICD-10-CM | POA: Diagnosis not present

## 2024-02-06 DIAGNOSIS — Z853 Personal history of malignant neoplasm of breast: Secondary | ICD-10-CM | POA: Diagnosis not present

## 2024-02-06 DIAGNOSIS — R918 Other nonspecific abnormal finding of lung field: Secondary | ICD-10-CM

## 2024-02-06 NOTE — H&P (View-Only) (Signed)
 Subjective:    Patient ID: Gabriella Green, female    DOB: [birth year], 73 y.o.   MRN: [id number]  Patient Care Team: Odell Chard, Edra GRADE, MD as PCP - General (Family Medicine) Dellie Louanne MATSU, MD (General Surgery) Cindie Jesusa HERO, RN as Registered Nurse Georgina Shasta POUR, RN as Oncology Nurse Navigator Rennie Cindy SAUNDERS, MD as Consulting Physician (Oncology)  Chief Complaint  Patient presents with   Consult    Nodule. No SOB or wheezing. Cough with clear sputum.    BACKGROUND: Patient is a 73 year old current smoker, who presents for evaluation of an abnormal lung cancer screening CT.  She is kindly referred by Dr. Edra Odell Chard  HPI Discussed the use of AI scribe software for clinical note transcription with the patient, who gave verbal consent to proceed.  History of Present Illness   Gabriella Green is a 73 year old female with a history of breast cancer who presents for evaluation of a lung mass. She was referred by Dr. Odell Chard for evaluation of a lung mass.  She was called in for evaluation after a test showed a concerning finding. She is unsure if the mass is scar tissue from previous radiation and cancer treatment. This is her first lung cancer screening.  She has not had any fevers, chills or sweats.  No cough or sputum production.  No hemoptysis.  Does not endorse any weight loss or anorexia.  She has been totally asymptomatic with regards to lung cancer.  She has had a prior history of breast cancer for which she follows with Dr. Rennie.  She has had a history of metachronous breast cancer per Dr. 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  She is on anastrozole .  She continues to smoke approximately three-quarters to a full pack per day. She has tried nicotine patches and pills to help quit smoking.  She has a history of emphysema/COPD. She  has not experienced shortness of breath recently and has not used an inhaler in about four years, although she had one for occasional shortness of breath when working in the yard.  Her daughter has Wegener's disease, which led to a leg amputation, and she helps care for her. She also assists her grandson, who is on probation, by taking him to his probation classes monthly.  She is still employed at [employer].      Review of Systems A 10 point review of systems was performed and it is as noted above otherwise negative.   Past Medical History:  Diagnosis Date   BRCA negative 2013   Breast cancer [facility]) 2005   left breast ca   Breast cancer (HCC) 2006, 1999   right breast ca   Cancer [facility]) [phone]   right mastectomy and left breast lumpectomy   Personal history of chemotherapy    2006 rt breast ca   Personal history of radiation therapy    PONV (postoperative nausea and vomiting)     Past Surgical History:  Procedure Laterality Date   BREAST BIOPSY Left 2019    ADENOSIS, SKELETAL MUSCLE of LEFT breast   BREAST BIOPSY Left 10/23/2022   Left Breast Stereo Bx, X clip - path pending   BREAST BIOPSY Left 10/23/2022   MM LT BREAST BX W LOC DEV 1ST LESION IMAGE BX SPEC STEREO GUIDE 10/23/2022 [facility]   BREAST CYST ASPIRATION Bilateral    BREAST  EXCISIONAL BIOPSY Right 1999   rad breast ca   BREAST EXCISIONAL BIOPSY Left 2005   rad breast ca   BREAST EXCISIONAL BIOPSY Right 2006   mastectomy chemo    BREAST LUMPECTOMY Left 2005   1999 right lumpectomy   CHOLECYSTECTOMY  2002   COLONOSCOPY  2009   COLONOSCOPY WITH PROPOFOL  N/A 09/03/2018   Procedure: COLONOSCOPY WITH PROPOFOL ;  Surgeon: Unk Corinn Skiff, MD;  Location: [facility] ENDOSCOPY;  Service: Gastroenterology;  Laterality: N/A;   ESOPHAGOGASTRODUODENOSCOPY (EGD) WITH PROPOFOL  N/A 09/03/2018   Procedure: ESOPHAGOGASTRODUODENOSCOPY (EGD) WITH PROPOFOL ;  Surgeon: Unk Corinn Skiff, MD;  Location: [facility] ENDOSCOPY;   Service: Gastroenterology;  Laterality: N/A;   MASTECTOMY Right 2006   MASTECTOMY W/ SENTINEL NODE BIOPSY Left 11/13/2022   Procedure: MASTECTOMY WITH SENTINEL LYMPH NODE BIOPSY, total mastectomy;  Surgeon: Lane Shope, MD;  Location: [facility] ORS;  Service: General;  Laterality: Left;    Patient Active Problem List   Diagnosis Date Noted   Status post bilateral mastectomy 11/19/2022   Carcinoma of upper-outer quadrant of left breast in female, estrogen receptor positive (HCC) 10/30/2022   History of bilateral breast cancer 03/02/2020   Encounter for screening mammogram for breast cancer 03/02/2020   Nausea    Colon cancer screening    Cancer (HCC)     Family History  Problem Relation Age of Onset   Breast cancer Mother 74   Breast cancer Sister 13   Breast cancer Maternal Aunt 56    Social History   Tobacco Use   Smoking status: Every Day    Current packs/day: 0.75    Average packs/day: 0.8 packs/day for 20.0 years (15.0 ttl pk-yrs)    Types: Cigarettes    Passive exposure: Past   Smokeless tobacco: Never   Tobacco comments:    0.75 PPD - khj 02/06/2024        Started smoking in her late 20's.    Smoked 1PPD at her heaviest.  Substance Use Topics   Alcohol use: No    Allergies  Allergen Reactions   Codeine Nausea And Vomiting   Tape Swelling    tegaderm     Current Meds  Medication Sig   anastrozole  (ARIMIDEX ) 1 MG tablet TAKE 1 TABLET(1 MG) BY MOUTH DAILY   aspirin EC 81 MG tablet Take 81 mg by mouth daily. Swallow whole.   ondansetron  (ZOFRAN -ODT) 4 MG disintegrating tablet Take 4 mg by mouth every 8 (eight) hours as needed.     There is no immunization history on file for this patient.      Objective:     BP 124/70 (BP Location: Right Arm, Cuff Size: Normal)   Pulse 92   Temp (!) 97.1 F (36.2 C)   Ht 5' 1 (1.549 m)   Wt 81 lb 9.6 oz (37 kg)   SpO2 99%   BMI 15.42 kg/m   SpO2: 99 % O2 Device: None (Room air)  GENERAL: Thin, well-developed  woman, no acute distress, fully ambulatory.  Mild use of accessory use of respiration.  No conversational dyspnea. HEAD: Normocephalic, atraumatic.  EYES: Pupils equal, round, reactive to light.  No scleral icterus.  MOUTH: Chipped teeth otherwise intact. NECK: Supple. No thyromegaly. Trachea midline. No JVD.  No adenopathy. PULMONARY: Distant breath sounds.  Coarse, otherwise, no adventitious sounds. CARDIOVASCULAR: S1 and S2. Regular rate and rhythm.  No rubs, murmurs or gallops heard. ABDOMEN: Benign. MUSCULOSKELETAL: No joint deformity, no clubbing, no edema.  NEUROLOGIC: No overt focal deficit, no  gait disturbance, speech is fluent. SKIN: Intact,warm,dry. PSYCH: Mood and behavior normal.   Representative image from CT performed 19 January 2024 showing lobulated right lower lobe pulmonary mass with some central necrosis and spiculated margins (arrow):       Assessment & Plan:     ICD-10-CM   1. Right lower lobe lung mass  R91.8 CT SUPER D CHEST WO MONARCH PILOT    NM PET Image Initial (PI) Skull Base To Thigh (F-18 FDG)    2. History of bilateral breast cancer  Z85.3     3. Pulmonary emphysema, unspecified emphysema type (HCC)  J43.9     4. COPD suggested by initial evaluation (HCC)  J44.9       Orders Placed This Encounter  Procedures   CT SUPER D CHEST WO MONARCH PILOT    Standing Status:   Future    Expiration Date:   02/05/2025    Scheduling Instructions:     Please do before 16 Feb 2024.  Patient has robotic assisted navigational bronchoscopy on 11 August.    Preferred imaging location?:   [facility]   NM PET Image Initial (PI) Skull Base To Thigh (F-18 FDG)    Pt has biopsy on 11 Aug    Standing Status:   Future    Expected Date:   02/13/2024    Expiration Date:   02/05/2025    If indicated for the ordered procedure, I authorize the administration of a radiopharmaceutical per Radiology protocol:   Yes    Preferred imaging location?:   [facility]    Discussion:    Lung mass suspicious for malignancy A lung mass with additional smaller nodules was identified on imaging, raising suspicion for a new primary lung cancer rather than metastasis from her breast cancer. - Schedule robotic-assisted navigational bronchoscopy with endobronchial ultrasound on August 11 to obtain tissue samples. - Order PET scan and super dimension CT scan to assess metabolic activity and map the area for biopsy. - Discuss potential for lung collapse (<3% risk) and management, including placement of a small chest tube if necessary.  Emphysema due to tobacco use Emphysema is present, likely due to tobacco use. Smoking contributes to the development and progression of emphysema. - Advise smoking cessation. - Recommend nicotine patches, starting with step two (14 mg) due to her smoking history and body weight.  Tobacco use disorder She continues to smoke, contributing to her emphysema and potentially the lung mass. - Recommend nicotine patches as a smoking cessation aid, with removal at bedtime to prevent sleep disturbances. - Discuss alternative nicotine replacement options if patches are ineffective.    We will see the patient in follow-up 3 weeks postprocedure.  I discussed the case with Dr. Rennie, patient's oncologist.  Advised if symptoms do not improve or worsen, to please contact office for sooner follow up or seek emergency care.    I spent 60 minutes of dedicated to the care of this patient on the date of this encounter to include pre-visit review of records, face-to-face time with the patient discussing conditions above, post visit ordering of testing, clinical documentation with the electronic health record, making appropriate referrals as documented, and communicating necessary findings to members of the patients care team.   C. Leita Sanders, MD Advanced Bronchoscopy PCCM  [facility]    *This note was dictated using voice  recognition software/Dragon.  Despite best efforts to proofread, errors can occur which can change the meaning. Any transcriptional errors that result from this  process are unintentional and may not be fully corrected at the time of dictation.

## 2024-02-06 NOTE — Progress Notes (Signed)
 Subjective:    Patient ID: Gabriella Green, female    DOB: [birth year], 73 y.o.   MRN: [id number]  Patient Care Team: Odell Chard, Edra GRADE, MD as PCP - General (Family Medicine) Dellie Louanne MATSU, MD (General Surgery) Cindie Jesusa HERO, RN as Registered Nurse Georgina Shasta POUR, RN as Oncology Nurse Navigator Rennie Cindy SAUNDERS, MD as Consulting Physician (Oncology)  Chief Complaint  Patient presents with   Consult    Nodule. No SOB or wheezing. Cough with clear sputum.    BACKGROUND: Patient is a 73 year old current smoker, who presents for evaluation of an abnormal lung cancer screening CT.  She is kindly referred by Dr. Edra Odell Chard  HPI Discussed the use of AI scribe software for clinical note transcription with the patient, who gave verbal consent to proceed.  History of Present Illness   Gabriella Green is a 73 year old female with a history of breast cancer who presents for evaluation of a lung mass. She was referred by Dr. Odell Chard for evaluation of a lung mass.  She was called in for evaluation after a test showed a concerning finding. She is unsure if the mass is scar tissue from previous radiation and cancer treatment. This is her first lung cancer screening.  She has not had any fevers, chills or sweats.  No cough or sputum production.  No hemoptysis.  Does not endorse any weight loss or anorexia.  She has been totally asymptomatic with regards to lung cancer.  She has had a prior history of breast cancer for which she follows with Dr. Rennie.  She has had a history of metachronous breast cancer per Dr. 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  She is on anastrozole .  She continues to smoke approximately three-quarters to a full pack per day. She has tried nicotine patches and pills to help quit smoking.  She has a history of emphysema/COPD. She  has not experienced shortness of breath recently and has not used an inhaler in about four years, although she had one for occasional shortness of breath when working in the yard.  Her daughter has Wegener's disease, which led to a leg amputation, and she helps care for her. She also assists her grandson, who is on probation, by taking him to his probation classes monthly.  She is still employed at [employer].      Review of Systems A 10 point review of systems was performed and it is as noted above otherwise negative.   Past Medical History:  Diagnosis Date   BRCA negative 2013   Breast cancer [facility]) 2005   left breast ca   Breast cancer (HCC) 2006, 1999   right breast ca   Cancer [facility]) [phone]   right mastectomy and left breast lumpectomy   Personal history of chemotherapy    2006 rt breast ca   Personal history of radiation therapy    PONV (postoperative nausea and vomiting)     Past Surgical History:  Procedure Laterality Date   BREAST BIOPSY Left 2019    ADENOSIS, SKELETAL MUSCLE of LEFT breast   BREAST BIOPSY Left 10/23/2022   Left Breast Stereo Bx, X clip - path pending   BREAST BIOPSY Left 10/23/2022   MM LT BREAST BX W LOC DEV 1ST LESION IMAGE BX SPEC STEREO GUIDE 10/23/2022 [facility]   BREAST CYST ASPIRATION Bilateral    BREAST  EXCISIONAL BIOPSY Right 1999   rad breast ca   BREAST EXCISIONAL BIOPSY Left 2005   rad breast ca   BREAST EXCISIONAL BIOPSY Right 2006   mastectomy chemo    BREAST LUMPECTOMY Left 2005   1999 right lumpectomy   CHOLECYSTECTOMY  2002   COLONOSCOPY  2009   COLONOSCOPY WITH PROPOFOL  N/A 09/03/2018   Procedure: COLONOSCOPY WITH PROPOFOL ;  Surgeon: Unk Corinn Skiff, MD;  Location: [facility];  Service: Gastroenterology;  Laterality: N/A;   ESOPHAGOGASTRODUODENOSCOPY (EGD) WITH PROPOFOL  N/A 09/03/2018   Procedure: ESOPHAGOGASTRODUODENOSCOPY (EGD) WITH PROPOFOL ;  Surgeon: Unk Corinn Skiff, MD;  Location: [facility];   Service: Gastroenterology;  Laterality: N/A;   MASTECTOMY Right 2006   MASTECTOMY W/ SENTINEL NODE BIOPSY Left 11/13/2022   Procedure: MASTECTOMY WITH SENTINEL LYMPH NODE BIOPSY, total mastectomy;  Surgeon: Lane Shope, MD;  Location: [facility] ORS;  Service: General;  Laterality: Left;    Patient Active Problem List   Diagnosis Date Noted   Status post bilateral mastectomy 11/19/2022   Carcinoma of upper-outer quadrant of left breast in female, estrogen receptor positive (HCC) 10/30/2022   History of bilateral breast cancer 03/02/2020   Encounter for screening mammogram for breast cancer 03/02/2020   Nausea    Colon cancer screening    Cancer (HCC)     Family History  Problem Relation Age of Onset   Breast cancer Mother 19   Breast cancer Sister 24   Breast cancer Maternal Aunt 57    Social History   Tobacco Use   Smoking status: Every Day    Current packs/day: 0.75    Average packs/day: 0.8 packs/day for 20.0 years (15.0 ttl pk-yrs)    Types: Cigarettes    Passive exposure: Past   Smokeless tobacco: Never   Tobacco comments:    0.75 PPD - khj 02/06/2024        Started smoking in her late 20's.    Smoked 1PPD at her heaviest.  Substance Use Topics   Alcohol use: No    Allergies  Allergen Reactions   Codeine Nausea And Vomiting   Tape Swelling    tegaderm     Current Meds  Medication Sig   anastrozole  (ARIMIDEX ) 1 MG tablet TAKE 1 TABLET(1 MG) BY MOUTH DAILY   aspirin EC 81 MG tablet Take 81 mg by mouth daily. Swallow whole.   ondansetron  (ZOFRAN -ODT) 4 MG disintegrating tablet Take 4 mg by mouth every 8 (eight) hours as needed.     There is no immunization history on file for this patient.      Objective:     BP 124/70 (BP Location: Right Arm, Cuff Size: Normal)   Pulse 92   Temp (!) 97.1 F (36.2 C)   Ht 5' 1 (1.549 m)   Wt 81 lb 9.6 oz (37 kg)   SpO2 99%   BMI 15.42 kg/m   SpO2: 99 % O2 Device: None (Room air)  GENERAL: Thin, well-developed  woman, no acute distress, fully ambulatory.  Mild use of accessory use of respiration.  No conversational dyspnea. HEAD: Normocephalic, atraumatic.  EYES: Pupils equal, round, reactive to light.  No scleral icterus.  MOUTH: Chipped teeth otherwise intact. NECK: Supple. No thyromegaly. Trachea midline. No JVD.  No adenopathy. PULMONARY: Distant breath sounds.  Coarse, otherwise, no adventitious sounds. CARDIOVASCULAR: S1 and S2. Regular rate and rhythm.  No rubs, murmurs or gallops heard. ABDOMEN: Benign. MUSCULOSKELETAL: No joint deformity, no clubbing, no edema.  NEUROLOGIC: No overt focal deficit, no  gait disturbance, speech is fluent. SKIN: Intact,warm,dry. PSYCH: Mood and behavior normal.   Representative image from CT performed 19 January 2024 showing lobulated right lower lobe pulmonary mass with some central necrosis and spiculated margins (arrow):       Assessment & Plan:     ICD-10-CM   1. Right lower lobe lung mass  R91.8 CT SUPER D CHEST WO MONARCH PILOT    NM PET Image Initial (PI) Skull Base To Thigh (F-18 FDG)    2. History of bilateral breast cancer  Z85.3     3. Pulmonary emphysema, unspecified emphysema type (HCC)  J43.9     4. COPD suggested by initial evaluation (HCC)  J44.9       Orders Placed This Encounter  Procedures   CT SUPER D CHEST WO MONARCH PILOT    Standing Status:   Future    Expiration Date:   02/05/2025    Scheduling Instructions:     Please do before 16 Feb 2024.  Patient has robotic assisted navigational bronchoscopy on 11 August.    Preferred imaging location?:   [facility]   NM PET Image Initial (PI) Skull Base To Thigh (F-18 FDG)    Pt has biopsy on 11 Aug    Standing Status:   Future    Expected Date:   02/13/2024    Expiration Date:   02/05/2025    If indicated for the ordered procedure, I authorize the administration of a radiopharmaceutical per Radiology protocol:   Yes    Preferred imaging location?:   [facility]    Discussion:    Lung mass suspicious for malignancy A lung mass with additional smaller nodules was identified on imaging, raising suspicion for a new primary lung cancer rather than metastasis from her breast cancer. - Schedule robotic-assisted navigational bronchoscopy with endobronchial ultrasound on August 11 to obtain tissue samples. - Order PET scan and super dimension CT scan to assess metabolic activity and map the area for biopsy. - Discuss potential for lung collapse (<3% risk) and management, including placement of a small chest tube if necessary.  Emphysema due to tobacco use Emphysema is present, likely due to tobacco use. Smoking contributes to the development and progression of emphysema. - Advise smoking cessation. - Recommend nicotine patches, starting with step two (14 mg) due to her smoking history and body weight.  Tobacco use disorder She continues to smoke, contributing to her emphysema and potentially the lung mass. - Recommend nicotine patches as a smoking cessation aid, with removal at bedtime to prevent sleep disturbances. - Discuss alternative nicotine replacement options if patches are ineffective.    We will see the patient in follow-up 3 weeks postprocedure.  I discussed the case with Dr. Rennie, patient's oncologist.  Advised if symptoms do not improve or worsen, to please contact office for sooner follow up or seek emergency care.    I spent 60 minutes of dedicated to the care of this patient on the date of this encounter to include pre-visit review of records, face-to-face time with the patient discussing conditions above, post visit ordering of testing, clinical documentation with the electronic health record, making appropriate referrals as documented, and communicating necessary findings to members of the patients care team.   C. Leita Sanders, MD Advanced Bronchoscopy PCCM  [facility]    *This note was dictated using voice  recognition software/Dragon.  Despite best efforts to proofread, errors can occur which can change the meaning. Any transcriptional errors that result from this  process are unintentional and may not be fully corrected at the time of dictation.

## 2024-02-06 NOTE — Patient Instructions (Addendum)
 VISIT SUMMARY:  Today, you were seen for the evaluation of a lung mass that was found during a recent test. You have a history of breast cancer and emphysema, and you continue to smoke. We discussed the next steps for diagnosing the lung mass and strategies to help you quit smoking.  YOUR PLAN:  -LUNG MASS SUSPICIOUS FOR MALIGNANCY: A lung mass and additional smaller nodules were found on your imaging tests, which may indicate a new primary lung cancer rather than a spread from your previous breast cancer. We will perform a robotic-assisted navigational bronchoscopy with endobronchial ultrasound on August 11 to obtain tissue samples. Additionally, a PET scan and a super dimension CT scan will be done to assess the activity of the mass and to help guide the biopsy. There is a small risk of lung collapse during these procedures, but it can be managed with a small tube if necessary.  -EMPHYSEMA DUE TO TOBACCO USE: Emphysema is a condition that affects the spongy part of your lungs and is likely caused by your smoking. It can make breathing difficult over time. We strongly advise you to quit smoking. You should start using nicotine patches, beginning with step two (14 mg) due to your smoking history and body weight.  You may call 1 800 QUIT NOW, for further information  -TOBACCO USE DISORDER: Continuing to smoke is contributing to your emphysema and may be related to the lung mass. We recommend using nicotine patches to help you quit smoking. Remove the patch at bedtime to avoid sleep disturbances. If the patches are not effective, we can discuss other nicotine replacement options.  INSTRUCTIONS:  Please follow up with the scheduled robotic-assisted navigational bronchoscopy with endobronchial ultrasound on August 11. Additionally, complete the PET scan and super dimension CT scan as ordered. Begin using the nicotine patches as recommended and remove them at bedtime. If you have any questions or need  further assistance, please contact our office.

## 2024-02-06 NOTE — Telephone Encounter (Signed)
 Robotic Bronchoscopy with EBUS 02/16/2024 at 12:15am Lung Nodule 31627, 31652, [id number]  Gabriella Green please see Bronch info.  Patient is aware of date and times. Bronch email has been sent.

## 2024-02-07 ENCOUNTER — Ambulatory Visit: Payer: Self-pay | Admitting: Emergency Medicine

## 2024-02-07 ENCOUNTER — Encounter: Payer: Self-pay | Admitting: Pulmonary Disease

## 2024-02-09 NOTE — Telephone Encounter (Signed)
 For the codes 68372, [phone], [phone] Prior Auth Not Required Refer # [id number]

## 2024-02-09 NOTE — Telephone Encounter (Signed)
 Noted. Nothing further needed.

## 2024-02-11 ENCOUNTER — Encounter
Admission: RE | Admit: 2024-02-11 | Discharge: 2024-02-11 | Disposition: A | Discharge status: Home / Self Care | Source: Ambulatory Visit | Attending: Pulmonary Disease | Admitting: Pulmonary Disease

## 2024-02-11 ENCOUNTER — Other Ambulatory Visit: Payer: Self-pay

## 2024-02-11 DIAGNOSIS — Z01810 Encounter for preprocedural cardiovascular examination: Secondary | ICD-10-CM

## 2024-02-11 DIAGNOSIS — R918 Other nonspecific abnormal finding of lung field: Secondary | ICD-10-CM

## 2024-02-11 DIAGNOSIS — F17210 Nicotine dependence, cigarettes, uncomplicated: Secondary | ICD-10-CM

## 2024-02-11 HISTORY — DX: Other nonspecific abnormal finding of lung field: R91.8

## 2024-02-11 HISTORY — DX: Unspecified right bundle-branch block: I45.10

## 2024-02-11 HISTORY — DX: Chronic obstructive pulmonary disease, unspecified: J44.9

## 2024-02-11 NOTE — Patient Instructions (Signed)
 Your procedure is scheduled on:02-16-24 Monday Report to the Registration Desk on the 1st floor of the [facility].Then proceed to the 2nd floor Surgery Desk To find out your arrival time, please call [phone] between 1PM - 3PM on:02-13-24 Friday If your arrival time is 6:00 am, do not arrive before that time as the Medical Mall entrance doors do not open until 6:00 am.  REMEMBER: Instructions that are not followed completely may result in serious medical risk, up to and including death; or upon the discretion of your surgeon and anesthesiologist your surgery may need to be rescheduled.  Do not eat food OR drink liquids after midnight the night before surgery.  No gum chewing or hard candies.  One week prior to surgery:Stop NOW (02-11-24) Stop Anti-inflammatories (NSAIDS) such as Advil, Aleve, Ibuprofen, Motrin, Naproxen, Naprosyn and Aspirin based products such as Excedrin, Goody's Powder, BC Powder. Stop ANY OVER THE COUNTER supplements until after surgery.  You may however, continue to take Tylenol  if needed for pain up until the day of surgery.  Continue taking all of your other prescription medications up until the day of surgery.  Do NOT take any medication the day of surgery  Continue your 81 mg Aspirin up until the day prior to surgery  No Alcohol for 24 hours before or after surgery.  No Smoking including e-cigarettes for 24 hours before surgery.  No chewable tobacco products for at least 6 hours before surgery.  No nicotine patches on the day of surgery.  Do not use any recreational drugs for at least a week (preferably 2 weeks) before your surgery.  Please be advised that the combination of cocaine and anesthesia may have negative outcomes, up to and including death. If you test positive for cocaine, your surgery will be cancelled.  On the morning of surgery brush your teeth with toothpaste and water , you may rinse your mouth with mouthwash if you wish. Do not swallow  any toothpaste or mouthwash.  Do not wear jewelry, make-up, hairpins, clips or nail polish.  For welded (permanent) jewelry: bracelets, anklets, waist bands, etc.  Please have this removed prior to surgery.  If it is not removed, there is a chance that hospital personnel will need to cut it off on the day of surgery.  Do not wear lotions, powders, or perfumes.   Do not shave body hair from the neck down 48 hours before surgery.  Contact lenses, hearing aids and dentures may not be worn into surgery.  Do not bring valuables to the hospital. [facility] is not responsible for any missing/lost belongings or valuables.   Notify your doctor if there is any change in your medical condition (cold, fever, infection).  Wear comfortable clothing (specific to your surgery type) to the hospital.  After surgery, you can help prevent lung complications by doing breathing exercises.  Take deep breaths and cough every 1-2 hours. Your doctor may order a device called an Incentive Spirometer to help you take deep breaths. When coughing or sneezing, hold a pillow firmly against your incision with both hands. This is called "splinting." Doing this helps protect your incision. It also decreases belly discomfort.  If you are being admitted to the hospital overnight, leave your suitcase in the car. After surgery it may be brought to your room.  In case of increased patient census, it may be necessary for you, the patient, to continue your postoperative care in the Same Day Surgery department.  If you are being discharged  the day of surgery, you will not be allowed to drive home. You will need a responsible individual to drive you home and stay with you for 24 hours after surgery.   If you are taking public transportation, you will need to have a responsible individual with you.  Please call the Pre-admissions Testing Dept. at [phone] if you have any questions about these instructions.  Surgery  Visitation Policy:  Patients having surgery or a procedure may have two visitors.  Children under the age of 51 must have an adult with them who is not the patient.   Merchandiser, retail to address health-related social needs:  [email or web address]

## 2024-02-12 ENCOUNTER — Encounter
Admission: RE | Admit: 2024-02-12 | Discharge: 2024-02-12 | Disposition: A | Discharge status: Home / Self Care | Source: Ambulatory Visit | Attending: Pulmonary Disease | Admitting: Pulmonary Disease

## 2024-02-12 DIAGNOSIS — Z01818 Encounter for other preprocedural examination: Secondary | ICD-10-CM | POA: Diagnosis present

## 2024-02-12 DIAGNOSIS — Z01810 Encounter for preprocedural cardiovascular examination: Secondary | ICD-10-CM

## 2024-02-12 DIAGNOSIS — F17210 Nicotine dependence, cigarettes, uncomplicated: Secondary | ICD-10-CM | POA: Diagnosis not present

## 2024-02-12 DIAGNOSIS — I452 Bifascicular block: Secondary | ICD-10-CM | POA: Insufficient documentation

## 2024-02-13 ENCOUNTER — Ambulatory Visit
Admission: RE | Admit: 2024-02-13 | Discharge: 2024-02-13 | Disposition: A | Discharge status: Home / Self Care | Source: Ambulatory Visit | Attending: Pulmonary Disease | Admitting: Pulmonary Disease

## 2024-02-13 DIAGNOSIS — R918 Other nonspecific abnormal finding of lung field: Secondary | ICD-10-CM | POA: Diagnosis present

## 2024-02-16 ENCOUNTER — Encounter
Admission: RE | Disposition: A | Discharge status: Home / Self Care | Payer: Self-pay | Source: Home / Self Care | Attending: Pulmonary Disease

## 2024-02-16 ENCOUNTER — Ambulatory Visit

## 2024-02-16 ENCOUNTER — Ambulatory Visit: Admitting: Registered Nurse

## 2024-02-16 ENCOUNTER — Other Ambulatory Visit: Payer: Self-pay

## 2024-02-16 ENCOUNTER — Ambulatory Visit
Admission: RE | Admit: 2024-02-16 | Discharge: 2024-02-16 | Disposition: A | Discharge status: Home / Self Care | Attending: Pulmonary Disease | Admitting: Pulmonary Disease

## 2024-02-16 ENCOUNTER — Encounter: Payer: Self-pay | Admitting: Pulmonary Disease

## 2024-02-16 DIAGNOSIS — J439 Emphysema, unspecified: Secondary | ICD-10-CM | POA: Insufficient documentation

## 2024-02-16 DIAGNOSIS — F17210 Nicotine dependence, cigarettes, uncomplicated: Secondary | ICD-10-CM | POA: Diagnosis not present

## 2024-02-16 DIAGNOSIS — Z79811 Long term (current) use of aromatase inhibitors: Secondary | ICD-10-CM | POA: Diagnosis not present

## 2024-02-16 DIAGNOSIS — C3431 Malignant neoplasm of lower lobe, right bronchus or lung: Secondary | ICD-10-CM | POA: Diagnosis not present

## 2024-02-16 DIAGNOSIS — Z853 Personal history of malignant neoplasm of breast: Secondary | ICD-10-CM | POA: Diagnosis not present

## 2024-02-16 DIAGNOSIS — R911 Solitary pulmonary nodule: Secondary | ICD-10-CM | POA: Insufficient documentation

## 2024-02-16 DIAGNOSIS — R918 Other nonspecific abnormal finding of lung field: Secondary | ICD-10-CM

## 2024-02-16 HISTORY — PX: VIDEO BRONCHOSCOPY WITH ENDOBRONCHIAL NAVIGATION: SHX6175

## 2024-02-16 HISTORY — PX: ENDOBRONCHIAL ULTRASOUND: SHX5096

## 2024-02-16 SURGERY — VIDEO BRONCHOSCOPY WITH ENDOBRONCHIAL NAVIGATION
Anesthesia: General | Laterality: Bilateral

## 2024-02-16 MED ORDER — ORAL CARE MOUTH RINSE: 15.0000 mL | Freq: Once | OROMUCOSAL | Status: AC

## 2024-02-16 MED ORDER — PROPOFOL 500 MG/50ML IV EMUL
INTRAVENOUS | Status: DC | PRN
  Administered 2024-02-16 (×2): 125 ug/kg/min via INTRAVENOUS

## 2024-02-16 MED ORDER — SODIUM CHLORIDE 0.9 % IV SOLN: Freq: Once | INTRAVENOUS | Status: AC

## 2024-02-16 MED ORDER — IPRATROPIUM-ALBUTEROL 0.5-2.5 (3) MG/3ML IN SOLN
3.0000 mL | Freq: Once | RESPIRATORY_TRACT | Status: AC
  Administered 2024-02-16 (×2): 3 mL via RESPIRATORY_TRACT

## 2024-02-16 MED ORDER — LACTATED RINGERS IV SOLN: INTRAVENOUS | Status: DC

## 2024-02-16 MED ORDER — FENTANYL CITRATE (PF) 100 MCG/2ML IJ SOLN
INTRAMUSCULAR | Status: AC
Start: 2024-02-16 — End: 2024-02-16
  Filled 2024-02-16: qty 2

## 2024-02-16 MED ORDER — PROPOFOL 10 MG/ML IV BOLUS
INTRAVENOUS | Status: DC | PRN
Start: 2024-02-16 — End: 2024-02-16
  Administered 2024-02-16 (×2): 60 mg via INTRAVENOUS

## 2024-02-16 MED ORDER — LIDOCAINE HCL (CARDIAC) PF 100 MG/5ML IV SOSY
PREFILLED_SYRINGE | INTRAVENOUS | Status: DC | PRN
Start: 2024-02-16 — End: 2024-02-16
  Administered 2024-02-16 (×2): 4 mg via INTRAVENOUS

## 2024-02-16 MED ORDER — ESMOLOL HCL 100 MG/10ML IV SOLN
INTRAVENOUS | Status: AC
  Filled 2024-02-16: qty 10

## 2024-02-16 MED ORDER — CHLORHEXIDINE GLUCONATE 0.12 % MT SOLN
15.0000 mL | Freq: Once | OROMUCOSAL | Status: AC
  Administered 2024-02-16 (×2): 15 mL via OROMUCOSAL

## 2024-02-16 MED ORDER — PROPOFOL 10 MG/ML IV BOLUS
INTRAVENOUS | Status: AC
  Filled 2024-02-16: qty 20

## 2024-02-16 MED ORDER — IPRATROPIUM-ALBUTEROL 0.5-2.5 (3) MG/3ML IN SOLN
RESPIRATORY_TRACT | Status: AC
  Filled 2024-02-16: qty 3

## 2024-02-16 MED ORDER — ROCURONIUM BROMIDE 10 MG/ML (PF) SYRINGE
PREFILLED_SYRINGE | INTRAVENOUS | Status: AC
  Filled 2024-02-16: qty 20

## 2024-02-16 MED ORDER — CHLORHEXIDINE GLUCONATE 0.12 % MT SOLN
OROMUCOSAL | Status: AC
  Filled 2024-02-16: qty 15

## 2024-02-16 MED ORDER — FENTANYL CITRATE (PF) 100 MCG/2ML IJ SOLN
INTRAMUSCULAR | Status: DC | PRN
  Administered 2024-02-16 (×2): 50 ug via INTRAVENOUS

## 2024-02-16 MED ORDER — DEXAMETHASONE SODIUM PHOSPHATE 10 MG/ML IJ SOLN
INTRAMUSCULAR | Status: DC | PRN
  Administered 2024-02-16 (×2): 4 mg via INTRAVENOUS

## 2024-02-16 MED ORDER — ESMOLOL HCL 100 MG/10ML IV SOLN
INTRAVENOUS | Status: DC | PRN
  Administered 2024-02-16 (×4): 20 ug via INTRAVENOUS

## 2024-02-16 MED ORDER — ONDANSETRON HCL 4 MG/2ML IJ SOLN
INTRAMUSCULAR | Status: DC | PRN
  Administered 2024-02-16 (×2): 4 mg via INTRAVENOUS

## 2024-02-16 MED ORDER — ROCURONIUM BROMIDE 100 MG/10ML IV SOLN
INTRAVENOUS | Status: DC | PRN
  Administered 2024-02-16: 30 mg via INTRAVENOUS
  Administered 2024-02-16 (×2): 10 mg via INTRAVENOUS
  Administered 2024-02-16: 30 mg via INTRAVENOUS

## 2024-02-16 MED ORDER — HYDROCOD POLI-CHLORPHE POLI ER 10-8 MG/5ML PO SUER
5.0000 mL | Freq: Once | ORAL | Status: AC
  Administered 2024-02-16 (×2): 5 mL via ORAL
  Filled 2024-02-16: qty 5

## 2024-02-16 MED ORDER — SUGAMMADEX SODIUM 200 MG/2ML IV SOLN
INTRAVENOUS | Status: DC | PRN
  Administered 2024-02-16 (×4): 100 mg via INTRAVENOUS

## 2024-02-16 MED ORDER — PROPOFOL 1000 MG/100ML IV EMUL
INTRAVENOUS | Status: AC
  Filled 2024-02-16: qty 400

## 2024-02-16 NOTE — Anesthesia Preprocedure Evaluation (Signed)
 Anesthesia Evaluation  Patient identified by MRN, date of birth, ID band Patient awake    Reviewed: Allergy & Precautions, NPO status , Patient's Chart, lab work & pertinent test results  History of Anesthesia Complications (+) PONV and history of anesthetic complications  Airway Mallampati: III  TM Distance: >3 FB Neck ROM: full    Dental no notable dental hx.    Pulmonary COPD, Current Smoker and Patient abstained from smoking.   Pulmonary exam normal        Cardiovascular negative cardio ROS Normal cardiovascular exam     Neuro/Psych negative neurological ROS  negative psych ROS   GI/Hepatic negative GI ROS, Neg liver ROS,,,  Endo/Other  negative endocrine ROS    Renal/GU      Musculoskeletal   Abdominal   Peds  Hematology negative hematology ROS (+)   Anesthesia Other Findings Past Medical History: 2013: BRCA negative 2005: Breast cancer (HCC)     Comment:  left breast ca 2006, 1999: Breast cancer (HCC)     Comment:  right breast ca No date: COPD (chronic obstructive pulmonary disease) (HCC) No date: Mass of right lung No date: Multiple lung nodules No date: Personal history of chemotherapy     Comment:  2006 rt breast ca No date: Personal history of radiation therapy No date: PONV (postoperative nausea and vomiting) No date: RBBB (right bundle branch block)  Past Surgical History: 2019: BREAST BIOPSY; Left     Comment:   ADENOSIS, SKELETAL MUSCLE of LEFT breast 10/23/2022: BREAST BIOPSY; Left     Comment:  Left Breast Stereo Bx, X clip - path pending 10/23/2022: BREAST BIOPSY; Left     Comment:  MM LT BREAST BX W LOC DEV 1ST LESION IMAGE BX SPEC               STEREO GUIDE 10/23/2022 [facility] No date: BREAST CYST ASPIRATION; Bilateral 1999: BREAST EXCISIONAL BIOPSY; Right     Comment:  rad breast ca 2005: BREAST EXCISIONAL BIOPSY; Left     Comment:  rad breast ca 2006: BREAST EXCISIONAL  BIOPSY; Right     Comment:  mastectomy chemo  2005: BREAST LUMPECTOMY; Left     Comment:  1999 right lumpectomy 2002: CHOLECYSTECTOMY 2009: COLONOSCOPY 09/03/2018: COLONOSCOPY WITH PROPOFOL ; N/A     Comment:  Procedure: COLONOSCOPY WITH PROPOFOL ;  Surgeon: Unk Corinn Skiff, MD;  Location: [facility];  Service:               Gastroenterology;  Laterality: N/A; 09/03/2018: ESOPHAGOGASTRODUODENOSCOPY (EGD) WITH PROPOFOL ; N/A     Comment:  Procedure: ESOPHAGOGASTRODUODENOSCOPY (EGD) WITH               PROPOFOL ;  Surgeon: Unk Corinn Skiff, MD;  Location:               [facility];  Service: Gastroenterology;  Laterality:               N/A; 2006: MASTECTOMY; Right 11/13/2022: MASTECTOMY W/ SENTINEL NODE BIOPSY; Left     Comment:  Procedure: MASTECTOMY WITH SENTINEL LYMPH NODE BIOPSY,               total mastectomy;  Surgeon: Lane Shope, MD;                Location: [facility];  Service: General;  Laterality: Left;  BMI    Body Mass Index: 15.49 kg/m  Reproductive/Obstetrics negative OB ROS                              Anesthesia Physical Anesthesia Plan  ASA: 3  Anesthesia Plan: General ETT   Post-op Pain Management: Ofirmev  IV (intra-op)*   Induction: Intravenous  PONV Risk Score and Plan: 4 or greater and Propofol  infusion and TIVA  Airway Management Planned: Oral ETT  Additional Equipment:   Intra-op Plan:   Post-operative Plan: Extubation in OR  Informed Consent: I have reviewed the patients History and Physical, chart, labs and discussed the procedure including the risks, benefits and alternatives for the proposed anesthesia with the patient or authorized representative who has indicated his/her understanding and acceptance.     Dental Advisory Given  Plan Discussed with: Anesthesiologist, CRNA and Surgeon  Anesthesia Plan Comments: (Patient consented for risks of anesthesia including but not limited  to:  - adverse reactions to medications - damage to eyes, teeth, lips or other oral mucosa - nerve damage due to positioning  - sore throat or hoarseness - Damage to heart, brain, nerves, lungs, other parts of body or loss of life  Patient voiced understanding and assent.)         Anesthesia Quick Evaluation

## 2024-02-16 NOTE — Transfer of Care (Signed)
 Immediate Anesthesia Transfer of Care Note  Patient: Gabriella Green  Procedure(s) Performed: VIDEO BRONCHOSCOPY WITH ENDOBRONCHIAL NAVIGATION (Bilateral) ENDOBRONCHIAL ULTRASOUND (EBUS) (Bilateral)  Patient Location: PACU  Anesthesia Type:General  Level of Consciousness: awake, alert , and oriented  Airway & Oxygen Therapy: Patient Spontanous Breathing and Patient connected to nasal cannula oxygen  Post-op Assessment: Report given to RN and Post -op Vital signs reviewed and stable  Post vital signs: Reviewed and stable  Last Vitals:  Vitals Value Taken Time  BP 140/59 02/16/24 14:10  Temp 36.6 C 02/16/24 14:10  Pulse 81 02/16/24 14:14  Resp 21 02/16/24 14:14  SpO2 100 % 02/16/24 14:14    Last Pain:  Vitals:   02/16/24 1410  TempSrc:   PainSc: 0-No pain         Complications: No notable events documented.

## 2024-02-16 NOTE — Interval H&P Note (Signed)
 Gabriella Green has presented today for surgery, with the diagnosis of RIGHT LUNG MASS.  The various methods of treatment have been discussed with the patient and family. After consideration of risks, benefits and other options for treatment, the patient has consented to  Procedure(s): ROBOTIC ASSISTED NAVIGATIONAL BRONCHOSCOPY WITH ENDOBRONCHIAL ULTRASOUND-RIGHT as a surgical intervention.  The patient's history has been reviewed, patient examined, no change in status, stable for surgery.  I have reviewed the patient's chart and labs.  Questions were answered to the patient's satisfaction.  Benefits, limitations and potential complications of the procedure were discussed with the patient/family.  Complications from bronchoscopy are rare and most often minor, but if they occur they may include breathing difficulty, vocal cord spasm, hoarseness, slight fever, vomiting, dizziness, bronchospasm, infection, low blood oxygen, bleeding from biopsy site, or an allergic reaction to medications.  It is uncommon for patients to experience other more serious complications for example: Collapsed lung requiring chest tube placement, respiratory failure, heart attack and/or cardiac arrhythmia.   KYM Leita Sanders, MD Advanced Bronchoscopy PCCM [facility]    *This note was generated using voice recognition software/Dragon and/or AI transcription program.  Despite best efforts to proofread, errors can occur which can change the meaning. Any transcriptional errors that result from this process are unintentional and may not be fully corrected at the time of dictation.

## 2024-02-16 NOTE — Discharge Instructions (Signed)
 Flexible Bronchoscopy, Care After This sheet gives you information about how to care for yourself after your test. Your doctor may also give you more specific instructions. If you have problems or questions, contact your doctor. Follow these instructions at home: Eating and drinking When you are wide awake, your numbness is gone and your cough and gag reflexes have come back, you may: Start eating only soft foods. Slowly drink liquids. Six hours after the test, go back to your normal diet. Driving Do not drive for 24 hours if you were given a medicine to help you relax (sedative). Do not drive or use heavy machinery while taking prescription pain medicine. General instructions Take over-the-counter and prescription medicines only as told by your doctor. Return to your normal activities as told. Ask what activities are safe for you. Do not use any products that have nicotine or tobacco in them. This includes cigarettes and e-cigarettes. If you need help quitting, ask your doctor. Keep all follow-up visits as told by your doctor. This is important. It is very important if you had a tissue sample (biopsy) taken. Get help right away if: You have shortness of breath that gets worse. You get light-headed. You feel like you are going to pass out (faint). You have chest pain. You cough up: More than a little blood. More blood than before. Summary Do not use cigarettes. Do not use e-cigarettes. Seek care in the Emergency Department right away if you have chest pain or shortness of breath. Call or MyChart Message our office for any questions or problems at [phone].    This information is not intended to replace advice given to you by your health care provider. Make sure you discuss any questions you have with your health care provider.

## 2024-02-16 NOTE — Op Note (Signed)
 Video Bronchoscopy with Robotic Assisted Bronchoscopic Navigation   Date of Operation: 02/16/2024   Pre-op Diagnosis: Right lower lobe lung mass, prior history of breast cancer, need for staging  Post-op Diagnosis: Same as above  Surgeon: KYM Leita Sanders, MD  Assistants: Ubaldo Corti, RRT  Anesthesia: General endotracheal anesthesia  Operation: Video bronchoscopy with robotic assistance and biopsies  Estimated Blood Loss: Minimal  Complications: None  Indications and History: Gabriella Green is a 73 y.o. female with history of COPD and prior breast cancer.  Recommendation made to achieve a tissue diagnosis via robotic assisted navigational bronchoscopy.  The risks, benefits, complications, treatment options and expected outcomes were discussed with the patient.  The possibilities of pneumothorax, pneumonia, reaction to medication, pulmonary aspiration, perforation of a viscus, bleeding, failure to diagnose a condition and creating a complication requiring transfusion or operation were discussed with the patient who freely signed the consent.    Description of Procedure: The patient was seen in the Preoperative Area, was examined and was deemed appropriate to proceed.  The patient was taken to [facility] Procedure Room2 (Bronchoscopy Suite) identified as [facility] and the procedure verified as  Video Bronchoscopy with Pharmacist, hospital.  A Time Out was held and the above information confirmed.   Prior to the date of the procedure a high-resolution CT scan of the chest was performed. Utilizing ION software program a virtual tracheobronchial tree was generated to allow the creation of distinct navigation pathways to the patient's parenchymal abnormalities. After being taken to the operating room general anesthesia was initiated and the patient  was orally intubated with a #8.5 ET tube. The video bronchoscope was introduced via the endotracheal tube and a general inspection was  performed which showed normal right and left lung anatomy. Aspiration of the bilateral mainstems was completed to remove any remaining secretions.  After survey bronchoscopy was completed the Ion robotic catheter inserted into patient's endotracheal tube.   Target #1 lung mass RLL: The distinct navigation pathways prepared prior to this procedure were then utilized to navigate to patient's lesion identified on CT scan. The robotic catheter was secured into place and the vision probe was withdrawn.  Lesion location was approximated using fluoroscopy.  Local registration and targeting was performed using GE OEC 3D mobile C-arm three-dimensional imaging. Under fluoroscopic guidance transbronchial needle aspirates,  and transbronchial forceps biopsies were performed to be sent for cytology and pathology.  2-in-lesion was confirmed using GE OEC 3D mobile C-arm.  Additionally, REBUS was utilized to confirm target acquisition.    Endobronchial ultrasound: Procedure Performed:  Once the robotic bronchoscopy was completed the airway was examined for hemostasis and the Olympus endobronchial ultrasound (EBUS) scope was introduced into the airway via ETT adapter.  At this point the mediastinum was examined there was no significant adenopathy noted on precarinal, subcarinal, and hilar areas noted.  Having completed this portion of the procedure there was excellent hemostasis noted.  The bronchoscope was retrieved and procedure was terminated. Patient was allowed to emerge from general anesthesia and was transferred to the PACU in satisfactory condition.  Samples Target #1: 1. Transbronchial needle biopsies from RLL: X 6 2. Transbronchial forceps biopsies from RLL: X 6  No samples from endobronchial ultrasound as no significant adenopathy noted.  Postprocedure chest x-ray showed no evidence of pneumothorax.  Impression: Lung mass, right lower lobe rule out cancer No significant mediastinal adenopathy  noted Status post technically successful robotic assisted navigational bronchoscopy  Plan:  Await pathology reports Patient  has appropriate pulmonary and oncology follow-ups PET/CT pending   C. Leita Sanders, MD Advanced Bronchoscopy PCCM [facility]    *This note was generated using voice recognition software/Dragon and/or AI transcription program.  Despite best efforts to proofread, errors can occur which can change the meaning. Any transcriptional errors that result from this process are unintentional and may not be fully corrected at the time of dictation.

## 2024-02-16 NOTE — Procedures (Signed)
 Date of Procedure: 02/16/2024    Pre-op Diagnosis: Right lower lobe lung mass, rule out metastatic carcinoma  Post-op Diagnosis: Same as above   Surgeon: KYM Leita Sanders, MD  Assistants: Ubaldo Corti, RRT  Fluoroscopy technician: Joesph Ahle, RTR   Anesthesia: General endotracheal anesthesia, see anesthesia record   Operation: Bronchoscopy with robotic assistance and biopsies.   Estimated Blood Loss: Minimal   Complications: None  Procedure Note: Clinical indication Evaluation of a newly detected lung mass in the right lower lobe.   Technique Intraoperative Cone Beam Computed Tomography (CBCT) images were acquired using a [employer] OEC 3D system, optimized for thoracic imaging. A high-quality 30-second acquisition protocol was used. The patient was positioned supine on the examination table. Pre-biopsy CBCT scans were performed to confirm the lesion's visibility and to determine the optimal approach. Navigation was performed using an ION Robotic Bronchoscopy platform (Intuitive). Repeat CBCT scans were performed to confirm tool-in-lesion placement and to assess for complications.  Findings Right lower lobe pulmonary mass: A solid pulmonary mass, measuring approximately 6.4 cm in diameter, is identified in the right lower lobe. The mass exhibits a lobulated appearance. Bronchial Anatomy: Patent airways are visualized up to the segmental bronchi leading towards the target mass. Navigation: The Ion bronchoscopic navigation successfully acquired the target and guided biopsy tools towards the right lower lobe nodule. Tool-in-Lesion Confirmation: Intraoperative CBCT confirmed the accurate positioning of the biopsy forceps within the nodule. Complications: No immediate complications such as pneumothorax or hemorrhage were observed during or immediately following the procedure.  Impression CBCT-guided navigation bronchoscopy with biopsy was performed for the peripheral pulmonary mass in  the right lower lobe. The procedure was technically successful with confirmed tool-in-lesion placement. Samples were obtained for histological and cytological examination.  Recommendations Submit biopsy samples for histopathological and cytological analysis. Images were uploaded to PACS. Schedule follow-up appointment with the referring physician to discuss the results and treatment plan. Monitor for delayed complications such as pneumothorax or hemorrhage.   Total fluoroscopy time: 3 minutes 3 seconds.  Total dose: 33.68 mGy.  Please see associated operative report for robotic bronchoscopy description.  KYM Leita Sanders, MD Advanced Bronchoscopy PCCM [facility]    *This note was generated using voice recognition software/Dragon and/or AI transcription program.  Despite best efforts to proofread, errors can occur which can change the meaning. Any transcriptional errors that result from this process are unintentional and may not be fully corrected at the time of dictation.

## 2024-02-17 ENCOUNTER — Encounter: Payer: Self-pay | Admitting: Pulmonary Disease

## 2024-02-17 DIAGNOSIS — R918 Other nonspecific abnormal finding of lung field: Secondary | ICD-10-CM

## 2024-02-17 NOTE — Anesthesia Postprocedure Evaluation (Signed)
 Anesthesia Post Note  Patient: Gabriella Green  Procedure(s) Performed: VIDEO BRONCHOSCOPY WITH ENDOBRONCHIAL NAVIGATION (Bilateral) ENDOBRONCHIAL ULTRASOUND (EBUS) (Bilateral)  Patient location during evaluation: PACU Anesthesia Type: General Level of consciousness: awake and alert Pain management: pain level controlled Vital Signs Assessment: post-procedure vital signs reviewed and stable Respiratory status: spontaneous breathing, nonlabored ventilation, respiratory function stable and patient connected to nasal cannula oxygen Cardiovascular status: blood pressure returned to baseline and stable Postop Assessment: no apparent nausea or vomiting Anesthetic complications: no   No notable events documented.   Last Vitals:  Vitals:   02/16/24 1445 02/16/24 1457  BP: (!) 127/54 (!) 143/51  Pulse: 90 86  Resp:  18  Temp:  36.7 C  SpO2: 98% 98%    Last Pain:  Vitals:   02/16/24 1457  TempSrc: Temporal  PainSc: 0-No pain                 Lendia LITTIE Mae

## 2024-02-18 ENCOUNTER — Other Ambulatory Visit: Payer: Self-pay | Admitting: *Deleted

## 2024-02-18 ENCOUNTER — Ambulatory Visit
Admission: RE | Admit: 2024-02-18 | Discharge: 2024-02-18 | Disposition: A | Discharge status: Home / Self Care | Source: Ambulatory Visit | Attending: Pulmonary Disease | Admitting: Pulmonary Disease

## 2024-02-18 ENCOUNTER — Encounter: Payer: Self-pay | Admitting: *Deleted

## 2024-02-18 DIAGNOSIS — R918 Other nonspecific abnormal finding of lung field: Secondary | ICD-10-CM

## 2024-02-18 DIAGNOSIS — I700 Atherosclerosis of aorta: Secondary | ICD-10-CM | POA: Diagnosis not present

## 2024-02-18 DIAGNOSIS — J439 Emphysema, unspecified: Secondary | ICD-10-CM | POA: Insufficient documentation

## 2024-02-18 LAB — GLUCOSE, CAPILLARY: Glucose-Capillary: 80 mg/dL (ref 70–99)

## 2024-02-18 LAB — CYTOLOGY - NON PAP

## 2024-02-18 LAB — SURGICAL PATHOLOGY

## 2024-02-18 MED ORDER — FLUDEOXYGLUCOSE F - 18 (FDG) INJECTION
5.7600 | Freq: Once | INTRAVENOUS | Status: AC | PRN
  Administered 2024-02-18 (×2): 5.76 via INTRAVENOUS

## 2024-02-18 NOTE — Progress Notes (Signed)
 Phone call made to patient to briefly review pathology results and upcoming appt with Dr. KATHEE on Mon 8/18 at 9:45am. Reassurance provided during call. All questions answered. Contact info given and instructed to call with any questions or needs. Pt verbalized understanding.

## 2024-02-19 ENCOUNTER — Ambulatory Visit: Payer: Self-pay | Admitting: Pulmonary Disease

## 2024-02-23 ENCOUNTER — Other Ambulatory Visit: Payer: Self-pay | Admitting: Pulmonary Disease

## 2024-02-23 ENCOUNTER — Inpatient Hospital Stay: Attending: Internal Medicine | Admitting: Internal Medicine

## 2024-02-23 ENCOUNTER — Encounter: Payer: Self-pay | Admitting: Internal Medicine

## 2024-02-23 ENCOUNTER — Encounter: Payer: Self-pay | Admitting: *Deleted

## 2024-02-23 VITALS — BP 122/72 | HR 99 | Temp 97.7°F | Resp 18 | Ht 61.0 in | Wt 81.5 lb

## 2024-02-23 DIAGNOSIS — C50412 Malignant neoplasm of upper-outer quadrant of left female breast: Secondary | ICD-10-CM | POA: Insufficient documentation

## 2024-02-23 DIAGNOSIS — Z170 Estrogen receptor positive status [ER+]: Secondary | ICD-10-CM | POA: Insufficient documentation

## 2024-02-23 DIAGNOSIS — Z79811 Long term (current) use of aromatase inhibitors: Secondary | ICD-10-CM | POA: Insufficient documentation

## 2024-02-23 DIAGNOSIS — F17210 Nicotine dependence, cigarettes, uncomplicated: Secondary | ICD-10-CM | POA: Insufficient documentation

## 2024-02-23 DIAGNOSIS — C3431 Malignant neoplasm of lower lobe, right bronchus or lung: Secondary | ICD-10-CM | POA: Insufficient documentation

## 2024-02-23 DIAGNOSIS — J439 Emphysema, unspecified: Secondary | ICD-10-CM

## 2024-02-23 DIAGNOSIS — J449 Chronic obstructive pulmonary disease, unspecified: Secondary | ICD-10-CM

## 2024-02-23 NOTE — Progress Notes (Signed)
 START ON PATHWAY REGIMEN - Non-Small Cell Lung     A cycle is every 7 days, concurrent with RT:     Paclitaxel      Carboplatin   **Always confirm dose/schedule in your pharmacy ordering system**  Patient Characteristics: Preoperative or Nonsurgical Candidate (Clinical Staging), Stage IIA or Stage IIB (N0-1 only), Nonsurgical Candidate Therapeutic Status: Preoperative or Nonsurgical Candidate (Clinical Staging) AJCC T Category: cT2b AJCC N Category: cN1 AJCC M Category: cM0 AJCC 9 Stage Grouping: IIB Check here if patient was staged using an edition other than AJCC Staging 9th Edition: false Intent of Therapy: Curative Intent, Discussed with Patient

## 2024-02-23 NOTE — Progress Notes (Addendum)
ALERT: A disease instance has been permanently removed from this patient's pathway record and replaced with a new disease instance. Information on the new disease instance will be transmitted in a separate message.  Disease Being Removed: Breast  Reason for Removal: Previous diagnosis has morphed into a different diagnosis 

## 2024-02-23 NOTE — Progress Notes (Signed)
 Will obtain PFTs prior to initiating chemotherapy/radiation.  Patient has advanced COPD.  Will obtain PFTs.  KYM Leita Sanders, MD Advanced Bronchoscopy PCCM [facility]

## 2024-02-23 NOTE — Progress Notes (Signed)
 [facility] CONSULT NOTE  Patient Care Team: Odell Chard, Edra GRADE, MD as PCP - General (Family Medicine) Dellie Louanne MATSU, MD (General Surgery) Cindie Jesusa HERO, RN as Registered Nurse Georgina Shasta POUR, RN as Oncology Nurse Navigator Rennie Cindy SAUNDERS, MD as Consulting Physician (Oncology) Verdene Gills, RN as Oncology Nurse Navigator  CHIEF COMPLAINTS/PURPOSE OF CONSULTATION: Lung cancer/breast cancer  #  Oncology History Overview Note  BRCA negative 2013    Breast cancer [facility]) 2005    left breast ca   Breast cancer [facility]) 2006, 1999    right breast ca   Cancer [facility]) [phone]   # Left breast- 1999 mild ducts- s/p lumpectomy; s/p RT- no chemo [Dr.Sankar/Dr.Byrnett]  # 2006 ? Stage- s/p right mastectomy s/p chemo [Dr.Choksi]; Dr.Chrystal     DIAGNOSIS: A.  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 Size of invasive carcinoma: 2 mm Mm in this sample Histologic grade of invasive carcinoma: Grade 2                      Glandular/tubular differentiation score: 3                      Nuclear pleomorphism score: 3                      Mitotic rate score: 1                      Total score: 7 Ductal carcinoma in situ: Present, high-grade comedo type with associated calcifications Lymphovascular invasion: Not identified  Comment: The definitive grade will be assigned on the excisional specimen. ER/PR/HER2: Immunohistochemistry will be performed on block A2, with reflex to FISH for HER2 2+. The results will be reported in an addendum.  GROSS DESCRIPTION: A. Labeled: Left breast stereo biopsy calcs upper outer quadrant mid depth Received: in a formalin-filled Brevera collection device Specimen radiograph image(s) available for review Time/Date in fixative: Collected at 10:58 AM on 10/23/2022 and placed in formalin at 11:01 AM on 10/23/2022 Cold ischemic time: Less  than 5 minutes Total fixation time: Approximately 9.25 hours Core pieces: Multiple Measurement: Aggregate, 8.5 x 1.5 x 0.3 cm Description / comments: Received are cores and fragments of yellow fibrofatty tissue.  The accompanying diagram has sections B and G checked. Inked: Green Entirely submitted in cassette(s):  1 - section B 2 - section G 3 - 7 - remaining tissue fragments      3 - sections A and C      4 - sections D and E      5 - sections F and H      6 - sections I and J      7 - sections K and L with remaining free-floating fragments  RB 10/23/2022  Final Diagnosis performed by Rexene Daily, MD.   Electronically signed 10/24/2022 10:40:25AM The electronic signature indicates that the named Attending Pathologist has evaluated the specimen Technical component performed at [facility], [address] [place], [place] [address] Lab: [phone] Dir: Frankey Sas, MD, MMM  Professional component performed at [facility], [facility], [address] [place], [place], [place] [address] Lab: [phone] Dir: Wilkie EMERSON Molt, MD  ADDENDUM: CASE SUMMARY: BREAST BIOMARKER TESTS Estrogen Receptor (ER) Status: POSITIVE         Percentage of cells with nuclear positivity: Greater than 90%  Average intensity of staining: Strong  Progesterone Receptor (PgR) Status: NEGATIVE (LESS THAN 1%)         Internal control cells present and stain as expected  HER2 (by immunohistochemistry): NEGATIVE (Score 1+)  Histologic Type: Invasive carcinoma of no special type (ductal) Histologic Grade (Nottingham Histologic Score)      Glandular (Acinar)/Tubular Differentiation: 3      Nuclear Pleomorphism: 3      Mitotic Rate: 3      Overall Grade: Grade 3 Tumor Size: At least 17 mm Tumor Focality: Single focus of invasive carcinoma Ductal Carcinoma In Situ (DCIS): Present      Positive for extensive intraductal component (EIC) Tumor Extent: Not applicable Lymphatic and/or  Vascular Invasion: Not identified  Treatment Effect in the Breast: No known presurgical therapy  MARGINS Margin Status for Invasive Carcinoma: All margins negative for invasive carcinoma   APRIL/MAY 2024-left breast invasive mammary carcinoma s/p mastectomy [Dr. Rodenberg]-at least pT1c [17 mm]; 2 sentinel lymph nodes negative; grade 3; Oncotype 35 high risk-declined chemotherapy.      Carcinoma of upper-outer quadrant of left breast in female, estrogen receptor positive (HCC)  10/30/2022 Initial Diagnosis   Carcinoma of upper-outer quadrant of left breast in female, estrogen receptor positive   10/30/2022 Cancer Staging   Staging form: Breast, AJCC 8th Edition - Clinical: Stage IA (cT1a, cN0, cM0, G2, ER+, PR-, HER2-) - Signed by Rennie Cindy SAUNDERS, MD on 10/30/2022 Histologic grading system: 3 grade system   12/05/2022 - 12/05/2022 Chemotherapy   Patient is on Treatment Plan : BREAST TC q21d     Primary cancer of right lower lobe of lung (HCC)  02/23/2024 Initial Diagnosis   Primary cancer of right lower lobe of lung (HCC)   02/23/2024 Cancer Staging   Staging form: Lung, AJCC V9 - Clinical: Stage IIB (cT2b, cN1, cM0) - Signed by Rennie Cindy SAUNDERS, MD on 02/23/2024   02/24/2024 -  Chemotherapy   Patient is on Treatment Plan : LUNG Carboplatin + Paclitaxel + XRT q7d      HISTORY OF PRESENTING ILLNESS: Patient ambulating-independently.  Alone.   Gabriella Green 73 y.o.  female patient with a history  left-sided breast cancer ER/PR positive HER2 negative stage I s/p mastectomy-high risk Oncotype; and a new diagnosis of right lower lobe lung cancer is here for follow-up.  In the interim patient was evaluated by pulmonology because of abnormal lung cancer screening CT scan.  Patient underwent biopsy.  Patient also underwent a PET scan.  With regards to breast cancer patient continues to be compliant with her anastrozole    Taking anastrazole with no side effects. Pt continues  work.   Review of Systems  Constitutional:  Negative for chills, diaphoresis, fever, malaise/fatigue and weight loss.  HENT:  Negative for nosebleeds and sore throat.   Eyes:  Negative for double vision.  Respiratory:  Positive for cough. Negative for hemoptysis, sputum production, shortness of breath and wheezing.   Cardiovascular:  Negative for chest pain, palpitations, orthopnea and leg swelling.  Gastrointestinal:  Negative for abdominal pain, blood in stool, constipation, diarrhea, heartburn, melena, nausea and vomiting.  Genitourinary:  Negative for dysuria, frequency and urgency.  Musculoskeletal:  Positive for back pain and joint pain.  Skin: Negative.  Negative for itching and rash.  Neurological:  Negative for dizziness, tingling, focal weakness, weakness and headaches.  Endo/Heme/Allergies:  Does not bruise/bleed easily.  Psychiatric/Behavioral:  Negative for depression. The patient is not nervous/anxious and does not have insomnia.  MEDICAL HISTORY:  Past Medical History:  Diagnosis Date   BRCA negative 2013   Breast cancer (HCC) 2005   left breast ca   Breast cancer (HCC) 2006, 1999   right breast ca   COPD (chronic obstructive pulmonary disease) (HCC)    Mass of right lung    Multiple lung nodules    Personal history of chemotherapy    2006 rt breast ca   Personal history of radiation therapy    PONV (postoperative nausea and vomiting)    RBBB (right bundle branch block)     SURGICAL HISTORY: Past Surgical History:  Procedure Laterality Date   BREAST BIOPSY Left 2019    ADENOSIS, SKELETAL MUSCLE of LEFT breast   BREAST BIOPSY Left 10/23/2022   Left Breast Stereo Bx, X clip - path pending   BREAST BIOPSY Left 10/23/2022   MM LT BREAST BX W LOC DEV 1ST LESION IMAGE BX SPEC STEREO GUIDE 10/23/2022 [facility]   BREAST CYST ASPIRATION Bilateral    BREAST EXCISIONAL BIOPSY Right 1999   rad breast ca   BREAST EXCISIONAL BIOPSY Left 2005   rad breast ca    BREAST EXCISIONAL BIOPSY Right 2006   mastectomy chemo    BREAST LUMPECTOMY Left 2005   1999 right lumpectomy   CHOLECYSTECTOMY  2002   COLONOSCOPY  2009   COLONOSCOPY WITH PROPOFOL  N/A 09/03/2018   Procedure: COLONOSCOPY WITH PROPOFOL ;  Surgeon: Unk Corinn Skiff, MD;  Location: [facility];  Service: Gastroenterology;  Laterality: N/A;   ENDOBRONCHIAL ULTRASOUND Bilateral 02/16/2024   Procedure: ENDOBRONCHIAL ULTRASOUND (EBUS);  Surgeon: Tamea Dedra CROME, MD;  Location: [facility] OR[facility];  Service: Pulmonary;  Laterality: Bilateral;   ESOPHAGOGASTRODUODENOSCOPY (EGD) WITH PROPOFOL  N/A 09/03/2018   Procedure: ESOPHAGOGASTRODUODENOSCOPY (EGD) WITH PROPOFOL ;  Surgeon: Unk Corinn Skiff, MD;  Location: [facility];  Service: Gastroenterology;  Laterality: N/A;   MASTECTOMY Right 2006   MASTECTOMY W/ SENTINEL NODE BIOPSY Left 11/13/2022   Procedure: MASTECTOMY WITH SENTINEL LYMPH NODE BIOPSY, total mastectomy;  Surgeon: [place] Shope, MD;  Location: [facility] ORS;  Service: General;  Laterality: Left;   VIDEO BRONCHOSCOPY WITH ENDOBRONCHIAL NAVIGATION Bilateral 02/16/2024   Procedure: VIDEO BRONCHOSCOPY WITH ENDOBRONCHIAL NAVIGATION;  Surgeon: Tamea Dedra CROME, MD;  Location: [facility] ORS;  Service: Pulmonary;  Laterality: Bilateral;    SOCIAL HISTORY: Social History   Socioeconomic History   Marital status: Widowed    Spouse name: Not on file   Number of children: Not on file   Years of education: Not on file   Highest education level: Not on file  Occupational History   Not on file  Tobacco Use   Smoking status: Every Day    Current packs/day: 0.75    Average packs/day: 0.8 packs/day for 20.0 years (15.0 ttl pk-yrs)    Types: Cigarettes    Passive exposure: Past   Smokeless tobacco: Never   Tobacco comments:    0.75 PPD - khj 02/06/2024        Started smoking in her late 20's.    Smoked 1PPD at her heaviest.  Vaping Use   Vaping status: Never Used  Substance and Sexual Activity    Alcohol use: No   Drug use: No   Sexual activity: Not on file  Other Topics Concern   Not on file  Social History Narrative   Not on file   Social Drivers of Health   Financial Resource Strain: Not on file  Food Insecurity: No Food Insecurity (10/30/2022)   Hunger Vital Sign  Worried About Programme researcher, broadcasting/film/video in the Last Year: Never true    Ran Out of Food in the Last Year: Never true  Transportation Needs: No Transportation Needs (10/30/2022)   PRAPARE - Administrator, Civil Service (Medical): No    Lack of Transportation (Non-Medical): No  Physical Activity: Not on file  Stress: Not on file  Social Connections: Not on file  Intimate Partner Violence: Not on file    FAMILY HISTORY: Family History  Problem Relation Age of Onset   Breast cancer Mother 85   Breast cancer Sister 22   Breast cancer Maternal Aunt 40    ALLERGIES:  is allergic to codeine and tape.  MEDICATIONS:  Current Outpatient Medications  Medication Sig Dispense Refill   anastrozole  (ARIMIDEX ) 1 MG tablet TAKE 1 TABLET(1 MG) BY MOUTH DAILY (Patient taking differently: Take 1 mg by mouth at bedtime.) 90 tablet 1   aspirin EC 81 MG tablet Take 81 mg by mouth at bedtime. Swallow whole.     ondansetron  (ZOFRAN -ODT) 4 MG disintegrating tablet Take 4 mg by mouth every 8 (eight) hours as needed.     No current facility-administered medications for this visit.      SABRA  PHYSICAL EXAMINATION:   Vitals:   02/23/24 0940  BP: 122/72  Pulse: 99  Resp: 18  Temp: 97.7 F (36.5 C)  SpO2: 98%       Filed Weights   02/23/24 0940  Weight: 81 lb 8 oz (37 kg)        Physical Exam Vitals and nursing note reviewed.  HENT:     Head: Normocephalic and atraumatic.     Mouth/Throat:     Pharynx: Oropharynx is clear.  Eyes:     Extraocular Movements: Extraocular movements intact.     Pupils: Pupils are equal, round, and reactive to light.  Cardiovascular:     Rate and Rhythm: Normal  rate and regular rhythm.  Pulmonary:     Comments: Decreased breath sounds bilaterally.  Abdominal:     Palpations: Abdomen is soft.  Musculoskeletal:        General: Normal range of motion.     Cervical back: Normal range of motion.  Skin:    General: Skin is warm.  Neurological:     General: No focal deficit present.     Mental Status: She is alert and oriented to person, place, and time.  Psychiatric:        Behavior: Behavior normal.        Judgment: Judgment normal.      LABORATORY DATA:  I have reviewed the data as listed Lab Results  Component Value Date   WBC 7.9 12/08/2023   HGB 13.5 12/08/2023   HCT 40.7 12/08/2023   MCV 91.9 12/08/2023   PLT 390 12/08/2023   Recent Labs    12/08/23 1503  NA 138  K 3.8  CL 102  CO2 28  GLUCOSE 130*  BUN 15  CREATININE 0.76  CALCIUM 9.3  GFRNONAA >60  PROT 7.7  ALBUMIN 3.7  AST 17  ALT 12  ALKPHOS 78  BILITOT 0.4     RADIOGRAPHIC STUDIES: I have personally reviewed the radiological images as listed and agreed with the findings in the report. NM PET Image Initial (PI) Skull Base To Thigh (F-18 FDG) Result Date: 02/23/2024 CLINICAL DATA:  Initial treatment strategy for lung lesion. EXAM: NUCLEAR MEDICINE PET SKULL BASE TO THIGH TECHNIQUE: 5.8 mCi F-18 FDG was injected  intravenously. Full-ring PET imaging was performed from the skull base to thigh after the radiotracer. CT data was obtained and used for attenuation correction and anatomic localization. Fasting blood glucose: 80 mg/dl COMPARISON:  CT chest 91/91/7974, 01/19/2024. FINDINGS: Mediastinal blood pool activity: SUV max 1.9 Liver activity: SUV max NA NECK: No abnormal hypermetabolism. Incidental CT findings: None. CHEST: Hypermetabolic right lower lobe mass measures 4.2 x 4.5 cm, SUV max 16.0. Mild right hilar hypermetabolism, SUV max 2.5. No additional abnormal hypermetabolism. Incidental CT findings: Atherosclerotic calcification of the aorta. Heart is enlarged.  No pericardial or pleural effusion. Centrilobular and paraseptal emphysema. ABDOMEN/PELVIS: No abnormal hypermetabolism. Incidental CT findings: Cholecystectomy. SKELETON: No abnormal hypermetabolism. Incidental CT findings: Osteopenia.  Degenerative changes in the spine. IMPRESSION: 1. Hypermetabolic right lower lobe mass and probable right hilar adenopathy, findings consistent with T2 be N1 M0 or stage II B primary bronchogenic carcinoma. 2.  Aortic atherosclerosis (ICD10-I70.0). 3.  Emphysema (ICD10-J43.9). Electronically Signed   By: Newell Eke M.D.   On: 02/23/2024 09:19   CT SUPER D CHEST WO MONARCH PILOT Result Date: 02/23/2024 CLINICAL DATA:  Right lung mass. EXAM: CT CHEST WITHOUT CONTRAST TECHNIQUE: Multidetector CT imaging of the chest was performed using thin slice collimation for electromagnetic bronchoscopy planning purposes, without intravenous contrast. RADIATION DOSE REDUCTION: This exam was performed according to the departmental dose-optimization program which includes automated exposure control, adjustment of the mA and/or kV according to patient size and/or use of iterative reconstruction technique. COMPARISON:  01/19/2024. FINDINGS: Cardiovascular: Atherosclerotic calcification of the aorta. Heart size normal. No pericardial effusion. Mediastinum/Nodes: No pathologically enlarged mediastinal or axillary lymph nodes. Hilar regions are difficult to definitively evaluate without IV contrast. Esophagus is grossly unremarkable. Lungs/Pleura: Centrilobular and paraseptal emphysema. Spiculated microlobulated mass in the right lower lobe measures 4.3 x 4.7 cm. There is a short border of contact with the medial visceral pleura. Calcified granulomas. No pleural fluid. Airway is unremarkable. Upper Abdomen: Cholecystectomy. Visualized portions of the liver, adrenal glands, kidneys, spleen, pancreas, stomach and bowel are grossly unremarkable. No upper abdominal adenopathy. Musculoskeletal:  Osteopenia. Degenerative changes in the spine. No worrisome lytic or sclerotic lesions. IMPRESSION: 1. Right lower lobe mass, compatible with primary bronchogenic carcinoma. 2.  Aortic atherosclerosis (ICD10-I70.0). 3.  Emphysema (ICD10-J43.9). Electronically Signed   By: Newell Eke M.D.   On: 02/23/2024 09:19   DG Chest Port 1 View Result Date: 02/16/2024 EXAM: 1 VIEW XRAY OF THE CHEST 02/16/2024 02:22:51 PM COMPARISON: Lung cancer screening CT of 01/19/2024. CLINICAL HISTORY: S/P bronchoscopy. FINDINGS: LUNGS AND PLEURA: Moderate pulmonary interstitial thickening. Soft tissue fullness projecting over the right hilum corresponds to the right lower lobe lung mass on CT. HEART AND MEDIASTINUM: Atherosclerosis in the transverse aorta. BONES AND SOFT TISSUES: Multiple wires and leads project over the chest on the frontal radiograph. No acute osseous abnormality. IMPRESSION: 1. Right lower lobe lung mass, corresponding to soft tissue fullness projecting over the right hilum. 2. Moderate pulmonary interstitial thickening likely COPD / chronic bronchitis 3.  no pneumothorax or other acute complication after bronchoscopy. Electronically signed by: Rockey Kilts MD 02/16/2024 03:07 PM EDT RP Workstation: [id number]   DG C-Arm 1-60 Min-No Report Result Date: 02/16/2024 Fluoroscopy was utilized by the requesting physician.  No radiographic interpretation.    ASSESSMENT & PLAN:   Primary cancer of right lower lobe of lung (HCC) # AUG 2025-[ [facility]] Spiculated necrotic mass in the RIGHT lower lobe, 6.4 cm greatest dimension, highly suspicious for neoplasm. Given history of breast cancer,  metastatic disease is not entirely excluded but felt less likely given morphology- Dr. Tamea. POSITIVE FOR MALIGNANCY- SQUAMOUS CELL CARCINOMA  PET scan:  Hypermetabolic right lower lobe mass and probable right hilar adenopathy, findings consistent with T2 be N1 M0 or stage II B primary bronchogenic carcinoma.  Recommend brain  MRI.  # # Long discussion with the patient regarding the goal of treatment of stage II lung cancer-goal is cure; however only 10-20% of the patients are cured.  Patient is a poor candidate for-definitive surgery.  Check PFTs.  #Discussed the role of concurrent chemoradiation [weekly carbotaxol with the daily radiation/Monday through Friday ~6 weeks].  Reviewed with the patient the potential side effects of radiation include skin rash; radiation esophagitis/pneumonitis.  Will also review at tumor conference.  # Post chemoradiation-consolidation immunotherapy with durvalumab every 2-4 weeks is recommended based on data from [facility] clinical trial.  #  I reviewed at length the individual components with chemotherapy; and the schedule in detail.  I also discussed the potential side effects including but not limited to-increasing fatigue, nausea vomiting, diarrhea, hair loss, sores in the mouth, increase risk of infection and also neuropathy.    # APRIL 2024-left breast  INVASIVE MAMMARY CARCINOMA, s/p mastectomy [Dr.Rodenberg]; pT1c [30mm ]pN0; ER-positive PR negative HER2/neu negative; G-3; ONCOTYPE  RS-32 high risk of recurrence.  Patiet declined Chemo.  Hx of Right mastectomy in 2006- s/p BIL MASTECTOMY-  # Patient currently on anastrozole .  Recommend anastrozole  for 10 years. Tolerating well.  Stable.   # Bone health- recommend BMD; but declines- recommend calcium 1200 mg/day; along with high dose vit D.   # active smoker: < 1ppd; recommend quitting-   #Incidental findings on Imaging  CT , 2025:  Aortic atherosclerosis;  Emphysema (ICD10-J43.9 I reviewed/discussed/counseled the patient.   # Chemotherapy education/nutrition evaluation: port placement. Plan start chemotherapy Sent the scripts for antiemetics-Zofran  and Compazine; EMLA cream sent to pharmacy.  # IV ACCESS: recommend port placement.   PS-  # DISPOSITION: # PFTs  # refer to Chemo education ASAP- re: chemo # refer to IR re: port  placement # refer to Nutrition/speech path re: cancer/concern for malnutrition # referral to Dr.Chrystal re: lung cancer # MRI brain-  # Follow up TBD-Dr.B  # 40 minutes face-to-face with the patient discussing the above plan of care; more than 50% of time spent on prognosis/ natural history; counseling and coordination.     All questions were answered. The patient/family knows to call the clinic with any problems, questions or concerns.    Cindy JONELLE Joe, MD 02/23/2024 4:04 PM

## 2024-02-23 NOTE — Progress Notes (Signed)
 Met with patient during follow up visit with Dr. Rennie to discuss recent biopsy results. All questions answered during visit. Reviewed upcoming appts. Informed that will be calling with appts once scheduled as well. Contact info given and instructed to call with any questions or needs. Pt verbalized understanding.

## 2024-02-23 NOTE — Assessment & Plan Note (Addendum)
#   AUG 2025-[ [facility]] Spiculated necrotic mass in the RIGHT lower lobe, 6.4 cm greatest dimension, highly suspicious for neoplasm. Given history of breast cancer, metastatic disease is not entirely excluded but felt less likely given morphology- Dr. Tamea. POSITIVE FOR MALIGNANCY- SQUAMOUS CELL CARCINOMA  PET scan:  Hypermetabolic right lower lobe mass and probable right hilar adenopathy, findings consistent with T2 be N1 M0 or stage II B primary bronchogenic carcinoma.  Recommend brain MRI.  # # Long discussion with the patient regarding the goal of treatment of stage II lung cancer-goal is cure; however only 10-20% of the patients are cured.  Patient is a poor candidate for-definitive surgery.  Check PFTs.  #Discussed the role of concurrent chemoradiation [weekly carbotaxol with the daily radiation/Monday through Friday ~6 weeks].  Reviewed with the patient the potential side effects of radiation include skin rash; radiation esophagitis/pneumonitis.  Will also review at tumor conference.  # Post chemoradiation-consolidation immunotherapy with durvalumab every 2-4 weeks is recommended based on data from [facility] clinical trial.  #  I reviewed at length the individual components with chemotherapy; and the schedule in detail.  I also discussed the potential side effects including but not limited to-increasing fatigue, nausea vomiting, diarrhea, hair loss, sores in the mouth, increase risk of infection and also neuropathy.    # APRIL 2024-left breast  INVASIVE MAMMARY CARCINOMA, s/p mastectomy [Dr.Rodenberg]; pT1c [58mm ]pN0; ER-positive PR negative HER2/neu negative; G-3; ONCOTYPE  RS-32 high risk of recurrence.  Patiet declined Chemo.  Hx of Right mastectomy in 2006- s/p BIL MASTECTOMY-  # Patient currently on anastrozole .  Recommend anastrozole  for 10 years. Tolerating well.  Stable.   # Bone health- recommend BMD; but declines- recommend calcium 1200 mg/day; along with high dose vit D.   # active  smoker: < 1ppd; recommend quitting-   #Incidental findings on Imaging  CT , 2025:  Aortic atherosclerosis;  Emphysema (ICD10-J43.9 I reviewed/discussed/counseled the patient.   # Chemotherapy education/nutrition evaluation: port placement. Plan start chemotherapy Sent the scripts for antiemetics-Zofran  and Compazine; EMLA cream sent to pharmacy.  # IV ACCESS: recommend port placement.   PS-  # DISPOSITION: # PFTs  # refer to Chemo education ASAP- re: chemo # refer to IR re: port placement # refer to Nutrition/speech path re: cancer/concern for malnutrition # referral to Dr.Chrystal re: lung cancer # MRI brain-  # Follow up TBD-Dr.B  # 40 minutes face-to-face with the patient discussing the above plan of care; more than 50% of time spent on prognosis/ natural history; counseling and coordination.

## 2024-02-23 NOTE — Progress Notes (Signed)
 Biopsy 02/16/24, PET 02/18/24, CT chest 02/13/24.

## 2024-02-24 ENCOUNTER — Inpatient Hospital Stay

## 2024-02-24 DIAGNOSIS — C3431 Malignant neoplasm of lower lobe, right bronchus or lung: Secondary | ICD-10-CM

## 2024-02-24 NOTE — Progress Notes (Signed)
 [facility] CSW Progress Note  Clinical Social Work introduced self to patient during Patient Education with [name], Charity fundraiser.  Provided information regarding CSW role, including counseling, advanced care planning and support group.  Answered questions as needed.  Follow Up Plan:  CSW will follow-up with patient by phone     Gabriella CHRISTELLA Au, LCSW Clinical Social Worker [facility]

## 2024-02-25 ENCOUNTER — Inpatient Hospital Stay: Admitting: Licensed Clinical Social Worker

## 2024-02-25 ENCOUNTER — Other Ambulatory Visit: Payer: Self-pay

## 2024-02-25 NOTE — Progress Notes (Signed)
 [facility] Psychosocial Distress Screening Clinical Social Work  Gabriella Green is a 73 y.o. year old female. Clinical Social Work was referred by nurse navigator for positive distress screening. The patient scored a 5 on the Psychosocial Distress Thermometer which indicates moderate distress. Clinical Social Worker contacted patient by phone to assess for distress and other psychosocial needs.   Patient continues working at [employer] part-time due to financial strain.  Her church is very supportive.  She said she knows what to expect, which makes her feel anxious.    Distress Screen:    02/24/2024    1:22 PM  ONCBCN DISTRESS SCREENING  Screening Type Initial Screening  How much distress have you been experiencing in the past week? (0-10) 7  Practical concerns type Finances  Emotional concerns type Worry or anxiety;Fear  Physical Concerns Type  Tobacco use     Interventions: Patient interviewed and appropriate assessments performed: brief mental health assessment   CSW and patient discussed common feeling and emotions when being diagnosed with cancer, and the importance of support during treatment.  CSW informed patient of the support team and support services at [facility].  CSW provided contact information and encouraged patient to call with any questions or concerns.   Follow Up Plan: CSW will follow-up with patient by phone  Patient verbalizes understanding of plan: Yes    Gabriella Green Gabriella Pavlovich, LCSW

## 2024-02-26 ENCOUNTER — Ambulatory Visit
Admission: RE | Admit: 2024-02-26 | Discharge: 2024-02-26 | Disposition: A | Discharge status: Home / Self Care | Source: Ambulatory Visit | Attending: Internal Medicine | Admitting: Internal Medicine

## 2024-02-26 ENCOUNTER — Other Ambulatory Visit

## 2024-02-26 ENCOUNTER — Ambulatory Visit: Admitting: Radiation Oncology

## 2024-02-26 DIAGNOSIS — C3431 Malignant neoplasm of lower lobe, right bronchus or lung: Secondary | ICD-10-CM | POA: Insufficient documentation

## 2024-02-26 MED ORDER — GADOBUTROL 1 MMOL/ML IV SOLN
4.0000 mL | Freq: Once | INTRAVENOUS | Status: AC | PRN
  Administered 2024-02-26: 4 mL via INTRAVENOUS

## 2024-03-01 ENCOUNTER — Encounter: Payer: Self-pay | Admitting: Radiation Oncology

## 2024-03-01 ENCOUNTER — Ambulatory Visit
Admission: RE | Admit: 2024-03-01 | Discharge: 2024-03-01 | Disposition: A | Discharge status: Home / Self Care | Source: Ambulatory Visit | Attending: Radiation Oncology | Admitting: Radiation Oncology

## 2024-03-01 ENCOUNTER — Encounter: Payer: Self-pay | Admitting: *Deleted

## 2024-03-01 VITALS — BP 120/65 | HR 85 | Temp 97.0°F | Resp 16 | Ht 61.0 in | Wt 82.1 lb

## 2024-03-01 DIAGNOSIS — C3431 Malignant neoplasm of lower lobe, right bronchus or lung: Secondary | ICD-10-CM | POA: Diagnosis present

## 2024-03-01 NOTE — Consult Note (Signed)
 NEW PATIENT EVALUATION  Name: Gabriella Green  MRN: [id number]  Date:   03/01/2024     DOB: 1951/03/23   This 73 y.o. 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  REFERRING PHYSICIAN: Odell Chard, Consuelo *  CHIEF COMPLAINT:  Chief Complaint  Patient presents with   Lung Cancer    DIAGNOSIS: The encounter diagnosis was Primary cancer of right lower lobe of lung (HCC).   PREVIOUS INVESTIGATIONS:  CT scan PET CT scan reviewed Clinical notes reviewed Pathology report reviewed  HPI: Patient is a 73 year old female previously treated 18 years prior for invasive mammary carcinoma the left breast status post wide local excision and adjuvant radiation therapy.  She also underwent a right mastectomy in 2006 and has had bilateral mastectomies.  Her family doctor recently ordered a chest x-ray showing a right chest mass.  She was noted to have a right lower lobe mass CT scan was performed showing a 4.3 x 4.7 cm microlobulated mass in the right lower lobe consistent with malignancy.  PET/CT scan confirmed hypermetabolic right lower lobe mass and probable right hilar adenopathy consistent with a T2b N1 stage IIb primary bronchogenic carcinoma.  Patient underwent bronchoscopy with cytology positive for squamous cell carcinoma.  Patient had a brain MRI which is pending although on my initial review do not see evidence of metastatic disease.  She has been seen by medical oncology with recommendation for concurrent chemoradiation.  She is doing fairly well has a slight cough no hemoptysis chest tightness.  She is rather thin around 80 pounds and is slightly anorexic.  PLANNED TREATMENT REGIMEN: Concurrent chemoradiation  PAST MEDICAL HISTORY:  has a past medical history of BRCA negative (2013), Breast cancer (HCC) (2005), Breast cancer (HCC) (2006, 1999), COPD (chronic obstructive pulmonary disease) (HCC), Mass of  right lung, Multiple lung nodules, Personal history of chemotherapy, Personal history of radiation therapy, PONV (postoperative nausea and vomiting), and RBBB (right bundle branch block).    PAST SURGICAL HISTORY:  Past Surgical History:  Procedure Laterality Date   BREAST BIOPSY Left 2019    ADENOSIS, SKELETAL MUSCLE of LEFT breast   BREAST BIOPSY Left 10/23/2022   Left Breast Stereo Bx, X clip - path pending   BREAST BIOPSY Left 10/23/2022   MM LT BREAST BX W LOC DEV 1ST LESION IMAGE BX SPEC STEREO GUIDE 10/23/2022 [facility]   BREAST CYST ASPIRATION Bilateral    BREAST EXCISIONAL BIOPSY Right 1999   rad breast ca   BREAST EXCISIONAL BIOPSY Left 2005   rad breast ca   BREAST EXCISIONAL BIOPSY Right 2006   mastectomy chemo    BREAST LUMPECTOMY Left 2005   1999 right lumpectomy   CHOLECYSTECTOMY  2002   COLONOSCOPY  2009   COLONOSCOPY WITH PROPOFOL  N/A 09/03/2018   Procedure: COLONOSCOPY WITH PROPOFOL ;  Surgeon: Unk Corinn Skiff, MD;  Location: [facility];  Service: Gastroenterology;  Laterality: N/A;   ENDOBRONCHIAL ULTRASOUND Bilateral 02/16/2024   Procedure: ENDOBRONCHIAL ULTRASOUND (EBUS);  Surgeon: Tamea Dedra CROME, MD;  Location: [facility];  Service: Pulmonary;  Laterality: Bilateral;   ESOPHAGOGASTRODUODENOSCOPY (EGD) WITH PROPOFOL  N/A 09/03/2018   Procedure: ESOPHAGOGASTRODUODENOSCOPY (EGD) WITH PROPOFOL ;  Surgeon: Unk Corinn Skiff, MD;  Location: [facility];  Service: Gastroenterology;  Laterality: N/A;   MASTECTOMY Right 2006   MASTECTOMY W/ SENTINEL NODE BIOPSY Left 11/13/2022   Procedure: MASTECTOMY WITH SENTINEL LYMPH NODE BIOPSY, total mastectomy;  Surgeon: Lane Shope,  MD;  Location: [facility];  Service: General;  Laterality: Left;   VIDEO BRONCHOSCOPY WITH ENDOBRONCHIAL NAVIGATION Bilateral 02/16/2024   Procedure: VIDEO BRONCHOSCOPY WITH ENDOBRONCHIAL NAVIGATION;  Surgeon: Tamea Dedra CROME, MD;  Location: [facility];  Service: Pulmonary;  Laterality:  Bilateral;    FAMILY HISTORY: family history includes Breast cancer (age of onset: 55) in her mother; Breast cancer (age of onset: 17) in her maternal aunt and sister.  SOCIAL HISTORY:  reports that she has been smoking cigarettes. She has a 15 pack-year smoking history. She has been exposed to tobacco smoke. She has never used smokeless tobacco. She reports that she does not drink alcohol and does not use drugs.  ALLERGIES: Codeine and Tape  MEDICATIONS:  Current Outpatient Medications  Medication Sig Dispense Refill   Vitamin D , Ergocalciferol , (DRISDOL ) 1.25 MG (50000 UNIT) CAPS capsule Take 50,000 Units by mouth once a week.     anastrozole  (ARIMIDEX ) 1 MG tablet TAKE 1 TABLET(1 MG) BY MOUTH DAILY (Patient taking differently: Take 1 mg by mouth at bedtime.) 90 tablet 1   aspirin EC 81 MG tablet Take 81 mg by mouth at bedtime. Swallow whole.     ondansetron  (ZOFRAN -ODT) 4 MG disintegrating tablet Take 4 mg by mouth every 8 (eight) hours as needed.     No current facility-administered medications for this encounter.    ECOG PERFORMANCE STATUS:  0 - Asymptomatic  REVIEW OF SYSTEMS: History of bilateral breast cancer status post bilateral mastectomies Patient denies any weight loss, fatigue, weakness, fever, chills or night sweats. Patient denies any loss of vision, blurred vision. Patient denies any ringing  of the ears or hearing loss. No irregular heartbeat. Patient denies heart murmur or history of fainting. Patient denies any chest pain or pain radiating to her upper extremities. Patient denies any shortness of breath, difficulty breathing at night, cough or hemoptysis. Patient denies any swelling in the lower legs. Patient denies any nausea vomiting, vomiting of blood, or coffee ground material in the vomitus. Patient denies any stomach pain. Patient states has had normal bowel movements no significant constipation or diarrhea. Patient denies any dysuria, hematuria or significant  nocturia. Patient denies any problems walking, swelling in the joints or loss of balance. Patient denies any skin changes, loss of hair or loss of weight. Patient denies any excessive worrying or anxiety or significant depression. Patient denies any problems with insomnia. Patient denies excessive thirst, polyuria, polydipsia. Patient denies any swollen glands, patient denies easy bruising or easy bleeding. Patient denies any recent infections, allergies or URI. Patient s visual fields have not changed significantly in recent time.   PHYSICAL EXAM: BP 120/65   Pulse 85   Temp (!) 97 F (36.1 C) (Tympanic)   Resp 16   Ht 5' 1 (1.549 m)   Wt 82 lb 1.6 oz (37.2 kg)   BMI 15.51 kg/m  Thin slightly cachectic female in NAD.  She is status post bilateral mastectomies.  Well-developed well-nourished patient in NAD. HEENT reveals PERLA, EOMI, discs not visualized.  Oral cavity is clear. No oral mucosal lesions are identified. Neck is clear without evidence of cervical or supraclavicular adenopathy. Lungs are clear to A&P. Cardiac examination is essentially unremarkable with regular rate and rhythm without murmur rub or thrill. Abdomen is benign with no organomegaly or masses noted. Motor sensory and DTR levels are equal and symmetric in the upper and lower extremities. Cranial nerves II through XII are grossly intact. Proprioception is intact. No peripheral adenopathy or edema  is identified. No motor or sensory levels are noted. Crude visual fields are within normal range.  LABORATORY DATA: Pathology cytology reports reviewed    RADIOLOGY RESULTS: CT scans MRI scans of brain and PET/CT scan all reviewed compatible with above-stated findings   IMPRESSION: Stage IIb squamous cell carcinoma the right lung in 73 year old female with prior history of bilateral breast cancer  PLAN: At this time I have recommended concurrent chemoradiation therapy.  Would plan on delivering 48 Gray over 6-1/2 weeks with  concurrent chemotherapy.  I would use IMRT treatment planning and delivery to spare critical structures such as her heart and esophagus spinal cord and normal lung volume.  Risks and benefits of treatment including possible development of worsening cough fatigue alteration of blood counts skin reaction possible dysphagia from radiation esophagitis all were reviewed in detail with the patient.  We will coordinate her treatments with her chemotherapy.  There will be extra effort by both professional staff as well as technical staff to coordinate and manage concurrent chemoradiation and ensuing side effects during her treatments.  Patient comprehends my treatment plan well.  I personally set up and ordered CT simulation for later this week.  She is scheduled have a port placed would like that to be done on the left side if possible.  I would like to take this opportunity to thank you for allowing me to participate in the care of your patient.SABRA Marcey Penton, MD

## 2024-03-01 NOTE — Progress Notes (Signed)
 Met with patient during follow up visit with Dr. Lenn. All questions answered. Reassurance provided. Reviewed upcoming appts. Instructed to call with any questions or needs. Pt verbalized understanding.

## 2024-03-02 ENCOUNTER — Ambulatory Visit: Admission: RE | Admit: 2024-03-02 | Source: Ambulatory Visit | Admitting: Radiology

## 2024-03-03 ENCOUNTER — Other Ambulatory Visit: Payer: Self-pay | Admitting: Internal Medicine

## 2024-03-03 ENCOUNTER — Ambulatory Visit
Admission: RE | Admit: 2024-03-03 | Discharge: 2024-03-03 | Disposition: A | Discharge status: Home / Self Care | Source: Ambulatory Visit | Attending: Radiation Oncology | Admitting: Radiation Oncology

## 2024-03-03 ENCOUNTER — Encounter: Payer: Self-pay | Admitting: *Deleted

## 2024-03-03 ENCOUNTER — Other Ambulatory Visit: Payer: Self-pay | Admitting: Radiology

## 2024-03-03 DIAGNOSIS — C3431 Malignant neoplasm of lower lobe, right bronchus or lung: Secondary | ICD-10-CM

## 2024-03-03 MED ORDER — ONDANSETRON HCL 8 MG PO TABS: 8.0000 mg | ORAL_TABLET | Freq: Three times a day (TID) | ORAL | 1 refills | Status: DC | PRN

## 2024-03-03 MED ORDER — PROCHLORPERAZINE MALEATE 10 MG PO TABS: 10.0000 mg | ORAL_TABLET | Freq: Four times a day (QID) | ORAL | 1 refills | Status: DC | PRN

## 2024-03-03 MED ORDER — LIDOCAINE-PRILOCAINE 2.5-2.5 % EX CREA: TOPICAL_CREAM | CUTANEOUS | 3 refills | Status: DC

## 2024-03-03 NOTE — Progress Notes (Signed)
 Pharmacist Chemotherapy Monitoring - Initial Assessment    Anticipated start date: 03/15/24   The following has been reviewed per standard work regarding the patient's treatment regimen: The patient's diagnosis, treatment plan and drug doses, and organ/hematologic function Lab orders and baseline tests specific to treatment regimen  The treatment plan start date, drug sequencing, and pre-medications Prior authorization status  Patient's documented medication list, including drug-drug interaction screen and prescriptions for anti-emetics and supportive care specific to the treatment regimen The drug concentrations, fluid compatibility, administration routes, and timing of the medications to be used The patient's access for treatment and lifetime cumulative dose history, if applicable  The patient's medication allergies and previous infusion related reactions, if applicable   T2 be N1 M0 or stage II B primary bronchogenic carcinoma   weekly chemo  with daily radiation      Gabriella Green, RPH, 03/03/2024  2:45 PM

## 2024-03-03 NOTE — Progress Notes (Signed)
 Patient for IR Port Placement on Thurs 03/04/24, I called and LVM for the patient on the phone and gave pre-procedure instructions. VM made the patient to be here at 12:30p, NPO after MN prior to procedure as well as driver post procedure/recovery/discharge. Called 03/03/24

## 2024-03-03 NOTE — Progress Notes (Signed)
 Met with patient during CT simulation today. All questions answered during visit. Reviewed upcoming appts. Reassurance provided. Instructed to call with any questions or needs. Pt verbalized understanding.

## 2024-03-04 ENCOUNTER — Ambulatory Visit: Admitting: Pulmonary Disease

## 2024-03-04 ENCOUNTER — Ambulatory Visit
Admission: RE | Admit: 2024-03-04 | Discharge: 2024-03-04 | Disposition: A | Discharge status: Home / Self Care | Source: Ambulatory Visit | Attending: Internal Medicine | Admitting: Internal Medicine

## 2024-03-04 DIAGNOSIS — C3431 Malignant neoplasm of lower lobe, right bronchus or lung: Secondary | ICD-10-CM | POA: Diagnosis present

## 2024-03-04 DIAGNOSIS — Z853 Personal history of malignant neoplasm of breast: Secondary | ICD-10-CM | POA: Insufficient documentation

## 2024-03-04 DIAGNOSIS — Z9013 Acquired absence of bilateral breasts and nipples: Secondary | ICD-10-CM | POA: Insufficient documentation

## 2024-03-04 DIAGNOSIS — F17210 Nicotine dependence, cigarettes, uncomplicated: Secondary | ICD-10-CM | POA: Insufficient documentation

## 2024-03-04 HISTORY — PX: IR IMAGING GUIDED PORT INSERTION: IMG5740

## 2024-03-04 MED ORDER — LIDOCAINE HCL 1 % IJ SOLN
20.0000 mL | Freq: Once | INTRAMUSCULAR | Status: AC
  Administered 2024-03-04: 15 mL

## 2024-03-04 MED ORDER — MIDAZOLAM HCL 5 MG/5ML IJ SOLN
INTRAMUSCULAR | Status: AC | PRN
  Administered 2024-03-04: .5 mg via INTRAVENOUS
  Administered 2024-03-04: 1 mg via INTRAVENOUS

## 2024-03-04 MED ORDER — LIDOCAINE HCL 1 % IJ SOLN
INTRAMUSCULAR | Status: AC
  Filled 2024-03-04: qty 20

## 2024-03-04 MED ORDER — FENTANYL CITRATE (PF) 100 MCG/2ML IJ SOLN
INTRAMUSCULAR | Status: AC | PRN
  Administered 2024-03-04: 50 ug via INTRAVENOUS
  Administered 2024-03-04 (×2): 25 ug via INTRAVENOUS

## 2024-03-04 MED ORDER — HEPARIN SOD (PORK) LOCK FLUSH 100 UNIT/ML IV SOLN
500.0000 [IU] | Freq: Once | INTRAVENOUS | Status: AC
  Administered 2024-03-04: 500 [IU] via INTRAVENOUS

## 2024-03-04 MED ORDER — SODIUM CHLORIDE 0.9 % IV SOLN: INTRAVENOUS | Status: DC

## 2024-03-04 MED ORDER — MIDAZOLAM HCL 2 MG/2ML IJ SOLN
INTRAMUSCULAR | Status: AC | PRN
  Administered 2024-03-04: .5 mg via INTRAVENOUS

## 2024-03-04 MED ORDER — HEPARIN SOD (PORK) LOCK FLUSH 100 UNIT/ML IV SOLN
INTRAVENOUS | Status: AC
  Filled 2024-03-04: qty 5

## 2024-03-04 MED ORDER — MIDAZOLAM HCL 2 MG/2ML IJ SOLN
INTRAMUSCULAR | Status: AC
Start: 2024-03-04 — End: 2024-03-04
  Filled 2024-03-04: qty 2

## 2024-03-04 MED ORDER — FENTANYL CITRATE (PF) 100 MCG/2ML IJ SOLN
INTRAMUSCULAR | Status: AC
Start: 2024-03-04 — End: 2024-03-04
  Filled 2024-03-04: qty 2

## 2024-03-04 NOTE — Progress Notes (Signed)
 Patient clinically stable post IR Port placement per Dr Luverne, tolerated well. Vitals stable post procedure. Awake/alert and oriented post procedure. Received Versed  2 mg along with Fentanyl  100 mcg IV for procedure. Discharge instructions given with questions answered post procedure. Taking po's without difficulty.

## 2024-03-04 NOTE — H&P (Signed)
 Chief Complaint: Patient was seen in consultation today for right lower lobe lung cancer   Procedure: Port-a-catheter insertion  Referring Physician(s): Rennie Cindy SAUNDERS  Supervising Physician: Luverne Aran  Patient Status: [facility] - Out-pt  History of Present Illness: Gabriella Green is a 73 y.o. 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 She recently underwent LDCT for lung cancer screening in July and was found to have a highly suspicious mass in her right lower lobe. She underwent bronchoscopy on 8/11 with Dr. Tamea which resulted with  squamous cell carcinoma. She has plans to start chemotherapy and radiation therapy. She has been referred to IR for port-a-catheter insertion.   Patient resting in bed in no acute distress. States that she does have some shortness of breath and generalized fatigue, but is otherwise doing well. She denies any abdominal pain, nausea/vomiting, changes in bowel/bladder habits, chest pain, fevers/chills. All questions and concerns answered at the bedside.    Past Medical History:  Diagnosis Date   BRCA negative 2013   Breast cancer (HCC) 2005   left breast ca   Breast cancer (HCC) 2006, 1999   right breast ca   COPD (chronic obstructive pulmonary disease) (HCC)    Mass of right lung    Multiple lung nodules    Personal history of chemotherapy    2006 rt breast ca   Personal history of radiation therapy    PONV (postoperative nausea and vomiting)    RBBB (right bundle branch block)     Past Surgical History:  Procedure Laterality Date   BREAST BIOPSY Left 2019    ADENOSIS, SKELETAL MUSCLE of LEFT breast   BREAST BIOPSY Left 10/23/2022   Left Breast Stereo Bx, X clip - path pending   BREAST BIOPSY Left 10/23/2022   MM LT BREAST BX W LOC DEV 1ST LESION IMAGE BX SPEC STEREO GUIDE 10/23/2022 [facility]   BREAST CYST ASPIRATION  Bilateral    BREAST EXCISIONAL BIOPSY Right 1999   rad breast ca   BREAST EXCISIONAL BIOPSY Left 2005   rad breast ca   BREAST EXCISIONAL BIOPSY Right 2006   mastectomy chemo    BREAST LUMPECTOMY Left 2005   1999 right lumpectomy   CHOLECYSTECTOMY  2002   COLONOSCOPY  2009   COLONOSCOPY WITH PROPOFOL  N/A 09/03/2018   Procedure: COLONOSCOPY WITH PROPOFOL ;  Surgeon: Unk Corinn Skiff, MD;  Location: [facility];  Service: Gastroenterology;  Laterality: N/A;   ENDOBRONCHIAL ULTRASOUND Bilateral 02/16/2024   Procedure: ENDOBRONCHIAL ULTRASOUND (EBUS);  Surgeon: Tamea Dedra CROME, MD;  Location: [facility] OR[facility];  Service: Pulmonary;  Laterality: Bilateral;   ESOPHAGOGASTRODUODENOSCOPY (EGD) WITH PROPOFOL  N/A 09/03/2018   Procedure: ESOPHAGOGASTRODUODENOSCOPY (EGD) WITH PROPOFOL ;  Surgeon: Unk Corinn Skiff, MD;  Location: [facility];  Service: Gastroenterology;  Laterality: N/A;   MASTECTOMY Right 2006   MASTECTOMY W/ SENTINEL NODE BIOPSY Left 11/13/2022   Procedure: MASTECTOMY WITH SENTINEL LYMPH NODE BIOPSY, total mastectomy;  Surgeon: Lane Shope, MD;  Location: [facility] OR[facility];  Service: General;  Laterality: Left;   VIDEO BRONCHOSCOPY WITH ENDOBRONCHIAL NAVIGATION Bilateral 02/16/2024   Procedure: VIDEO BRONCHOSCOPY WITH ENDOBRONCHIAL NAVIGATION;  Surgeon: Tamea Dedra CROME, MD;  Location: [facility] ORS;  Service: Pulmonary;  Laterality: Bilateral;    Allergies: Codeine and Tape  Medications: Prior to Admission medications   Medication Sig Start Date End Date Taking? Authorizing Provider  anastrozole  (ARIMIDEX ) 1 MG tablet TAKE 1 TABLET(1 MG) BY MOUTH DAILY Patient  taking differently: Take 1 mg by mouth at bedtime. 12/02/23  Yes Brahmanday, Govinda R, MD  aspirin EC 81 MG tablet Take 81 mg by mouth at bedtime. Swallow whole.   Yes [provider]  ondansetron  (ZOFRAN ) 8 MG tablet Take 1 tablet (8 mg total) by mouth every 8 (eight) hours as needed for nausea or vomiting. Start on  the third day after chemotherapy. 03/03/24  Yes Rennie Cindy SAUNDERS, MD  ondansetron  (ZOFRAN -ODT) 4 MG disintegrating tablet Take 4 mg by mouth every 8 (eight) hours as needed. 04/16/23  Yes [provider]  Vitamin D , Ergocalciferol , (DRISDOL ) 1.25 MG (50000 UNIT) CAPS capsule Take 50,000 Units by mouth once a week. 02/26/24  Yes [provider]  lidocaine -prilocaine  (EMLA ) cream Apply to affected area once 03/03/24   Rennie Cindy SAUNDERS, MD  prochlorperazine  (COMPAZINE ) 10 MG tablet Take 1 tablet (10 mg total) by mouth every 6 (six) hours as needed for nausea or vomiting. 03/03/24   Rennie Cindy SAUNDERS, MD     Family History  Problem Relation Age of Onset   Breast cancer Mother 30   Breast cancer Sister 26   Breast cancer Maternal Aunt 54    Social History   Socioeconomic History   Marital status: Widowed    Spouse name: Not on file   Number of children: Not on file   Years of education: Not on file   Highest education level: Not on file  Occupational History   Not on file  Tobacco Use   Smoking status: Every Day    Current packs/day: 0.75    Average packs/day: 0.8 packs/day for 20.0 years (15.0 ttl pk-yrs)    Types: Cigarettes    Passive exposure: Past   Smokeless tobacco: Never   Tobacco comments:    0.75 PPD - khj 02/06/2024        Started smoking in her late 20's.    Smoked 1PPD at her heaviest.  Vaping Use   Vaping status: Never Used  Substance and Sexual Activity   Alcohol use: No   Drug use: No   Sexual activity: Not on file  Other Topics Concern   Not on file  Social History Narrative   Not on file   Social Drivers of Health   Financial Resource Strain: Not on file  Food Insecurity: No Food Insecurity (10/30/2022)   Hunger Vital Sign    Worried About Running Out of Food in the Last Year: Never true    Ran Out of Food in the Last Year: Never true  Transportation Needs: No Transportation Needs (10/30/2022)   PRAPARE - Therapist, art (Medical): No    Lack of Transportation (Non-Medical): No  Physical Activity: Not on file  Stress: Not on file  Social Connections: Not on file    Review of Systems  Constitutional:  Positive for fatigue.  Respiratory:  Positive for shortness of breath.   Denies any N/V, chest pain, abdominal pain, fevers/chills. All other ROS negative.  Vital Signs: BP (!) 149/71   Pulse 88   Temp 97.8 F (36.6 C) (Oral)   Resp (!) 21   Ht 5' 1 (1.549 m)   Wt 82 lb (37.2 kg)   SpO2 100%   BMI 15.49 kg/m    Physical Exam Vitals reviewed.  HENT:     Mouth/Throat:     Mouth: Mucous membranes are moist.     Pharynx: Oropharynx is clear.  Cardiovascular:  Rate and Rhythm: Normal rate and regular rhythm.     Heart sounds: Normal heart sounds.  Pulmonary:     Effort: Pulmonary effort is normal.     Breath sounds: Normal breath sounds.  Abdominal:     General: Abdomen is flat.     Palpations: Abdomen is soft.     Tenderness: There is abdominal tenderness (epigastric).  Skin:    General: Skin is warm and dry.  Neurological:     General: No focal deficit present.     Mental Status: She is alert and oriented to person, place, and time.  Psychiatric:        Behavior: Behavior normal.     Imaging: NM PET Image Initial (PI) Skull Base To Thigh (F-18 FDG) Result Date: 02/23/2024 CLINICAL DATA:  Initial treatment strategy for lung lesion. EXAM: NUCLEAR MEDICINE PET SKULL BASE TO THIGH TECHNIQUE: 5.8 mCi F-18 FDG was injected intravenously. Full-ring PET imaging was performed from the skull base to thigh after the radiotracer. CT data was obtained and used for attenuation correction and anatomic localization. Fasting blood glucose: 80 mg/dl COMPARISON:  CT chest 91/91/7974, 01/19/2024. FINDINGS: Mediastinal blood pool activity: SUV max 1.9 Liver activity: SUV max NA NECK: No abnormal hypermetabolism. Incidental CT findings: None. CHEST: Hypermetabolic right lower lobe  mass measures 4.2 x 4.5 cm, SUV max 16.0. Mild right hilar hypermetabolism, SUV max 2.5. No additional abnormal hypermetabolism. Incidental CT findings: Atherosclerotic calcification of the aorta. Heart is enlarged. No pericardial or pleural effusion. Centrilobular and paraseptal emphysema. ABDOMEN/PELVIS: No abnormal hypermetabolism. Incidental CT findings: Cholecystectomy. SKELETON: No abnormal hypermetabolism. Incidental CT findings: Osteopenia.  Degenerative changes in the spine. IMPRESSION: 1. Hypermetabolic right lower lobe mass and probable right hilar adenopathy, findings consistent with T2 be N1 M0 or stage II B primary bronchogenic carcinoma. 2.  Aortic atherosclerosis (ICD10-I70.0). 3.  Emphysema (ICD10-J43.9). Electronically Signed   By: Newell Eke M.D.   On: 02/23/2024 09:19   CT SUPER D CHEST WO MONARCH PILOT Result Date: 02/23/2024 CLINICAL DATA:  Right lung mass. EXAM: CT CHEST WITHOUT CONTRAST TECHNIQUE: Multidetector CT imaging of the chest was performed using thin slice collimation for electromagnetic bronchoscopy planning purposes, without intravenous contrast. RADIATION DOSE REDUCTION: This exam was performed according to the departmental dose-optimization program which includes automated exposure control, adjustment of the mA and/or kV according to patient size and/or use of iterative reconstruction technique. COMPARISON:  01/19/2024. FINDINGS: Cardiovascular: Atherosclerotic calcification of the aorta. Heart size normal. No pericardial effusion. Mediastinum/Nodes: No pathologically enlarged mediastinal or axillary lymph nodes. Hilar regions are difficult to definitively evaluate without IV contrast. Esophagus is grossly unremarkable. Lungs/Pleura: Centrilobular and paraseptal emphysema. Spiculated microlobulated mass in the right lower lobe measures 4.3 x 4.7 cm. There is a short border of contact with the medial visceral pleura. Calcified granulomas. No pleural fluid. Airway is  unremarkable. Upper Abdomen: Cholecystectomy. Visualized portions of the liver, adrenal glands, kidneys, spleen, pancreas, stomach and bowel are grossly unremarkable. No upper abdominal adenopathy. Musculoskeletal: Osteopenia. Degenerative changes in the spine. No worrisome lytic or sclerotic lesions. IMPRESSION: 1. Right lower lobe mass, compatible with primary bronchogenic carcinoma. 2.  Aortic atherosclerosis (ICD10-I70.0). 3.  Emphysema (ICD10-J43.9). Electronically Signed   By: Newell Eke M.D.   On: 02/23/2024 09:19   DG Chest Port 1 View Result Date: 02/16/2024 EXAM: 1 VIEW XRAY OF THE CHEST 02/16/2024 02:22:51 PM COMPARISON: Lung cancer screening CT of 01/19/2024. CLINICAL HISTORY: S/P bronchoscopy. FINDINGS: LUNGS AND PLEURA: Moderate pulmonary interstitial thickening. Soft  tissue fullness projecting over the right hilum corresponds to the right lower lobe lung mass on CT. HEART AND MEDIASTINUM: Atherosclerosis in the transverse aorta. BONES AND SOFT TISSUES: Multiple wires and leads project over the chest on the frontal radiograph. No acute osseous abnormality. IMPRESSION: 1. Right lower lobe lung mass, corresponding to soft tissue fullness projecting over the right hilum. 2. Moderate pulmonary interstitial thickening likely COPD / chronic bronchitis 3.  no pneumothorax or other acute complication after bronchoscopy. Electronically signed by: Rockey Kilts MD 02/16/2024 03:07 PM EDT RP Workstation: [id number]   DG C-Arm 1-60 Min-No Report Result Date: 02/16/2024 Fluoroscopy was utilized by the requesting physician.  No radiographic interpretation.    Labs:  CBC: Recent Labs    12/08/23 1503  WBC 7.9  HGB 13.5  HCT 40.7  PLT 390    COAGS: No results for input(s): INR, APTT in the last 8760 hours.  BMP: Recent Labs    12/08/23 1503  NA 138  K 3.8  CL 102  CO2 28  GLUCOSE 130*  BUN 15  CALCIUM 9.3  CREATININE 0.76  GFRNONAA >60    LIVER FUNCTION TESTS: Recent Labs     12/08/23 1503  BILITOT 0.4  AST 17  ALT 12  ALKPHOS 78  PROT 7.7  ALBUMIN 3.7    TUMOR MARKERS: No results for input(s): AFPTM, CEA, CA199, CHROMGRNA in the last 8760 hours.  Assessment and Plan:  Squamous cell carcinoma of the RLL: Amantha A Grimsley is a 73 y.o. female with a history of left breast cancer and smoking who presents to [facility] Interventional Radiology department for an image-guided port-a-cathter insertion with Dr. KANDICE Moan. Procedure to be performed under moderate sedation.  Risks and benefits of image guided port-a-catheter placement was discussed with the patient including, but not limited to bleeding, infection, pneumothorax, or fibrin sheath development and need for additional procedures.  All of the patient's questions were answered, patient is agreeable to proceed. Consent signed and in chart.   Thank you for this interesting consult. I greatly enjoyed meeting Martavia A Okelly and look forward to participating in their care. A copy of this report was sent to the requesting provider on this date.  Electronically Signed: Glennon CHRISTELLA Bal, PA-C 03/04/2024, 1:27 PM   I spent a total of 30 Minutes in face to face clinical consultation, greater than 50% of which was counseling/coordinating care for port-a-catheter insertion.

## 2024-03-04 NOTE — Procedures (Signed)
 Interventional Radiology Procedure Note  Procedure: Single Lumen Power Port Placement    Access:  Left IJ vein.  Findings: Catheter tip positioned at SVC/RA junction. Port is ready for immediate use.   Complications: None  EBL: < 10 mL  Recommendations:  - Ok to shower in 24 hours - Do not submerge for 7 days - Routine line care   Gabriella Green T. Fredia Sorrow, M.D Pager:  [phone]

## 2024-03-05 ENCOUNTER — Encounter: Payer: Self-pay | Admitting: Internal Medicine

## 2024-03-11 DIAGNOSIS — C3431 Malignant neoplasm of lower lobe, right bronchus or lung: Secondary | ICD-10-CM | POA: Insufficient documentation

## 2024-03-15 ENCOUNTER — Encounter: Payer: Self-pay | Admitting: Internal Medicine

## 2024-03-15 ENCOUNTER — Inpatient Hospital Stay

## 2024-03-15 ENCOUNTER — Ambulatory Visit
Admission: RE | Admit: 2024-03-15 | Discharge: 2024-03-15 | Disposition: A | Discharge status: Home / Self Care | Source: Ambulatory Visit | Attending: Radiation Oncology | Admitting: Radiation Oncology

## 2024-03-15 ENCOUNTER — Inpatient Hospital Stay: Attending: Internal Medicine | Admitting: Internal Medicine

## 2024-03-15 ENCOUNTER — Other Ambulatory Visit

## 2024-03-15 ENCOUNTER — Encounter: Payer: Self-pay | Admitting: *Deleted

## 2024-03-15 VITALS — BP 134/70 | HR 81 | Temp 98.6°F | Resp 20 | Ht 61.0 in | Wt 80.8 lb

## 2024-03-15 VITALS — BP 119/51 | HR 75 | Temp 98.0°F | Resp 16

## 2024-03-15 DIAGNOSIS — C50412 Malignant neoplasm of upper-outer quadrant of left female breast: Secondary | ICD-10-CM | POA: Insufficient documentation

## 2024-03-15 DIAGNOSIS — F17210 Nicotine dependence, cigarettes, uncomplicated: Secondary | ICD-10-CM | POA: Insufficient documentation

## 2024-03-15 DIAGNOSIS — Z9013 Acquired absence of bilateral breasts and nipples: Secondary | ICD-10-CM | POA: Insufficient documentation

## 2024-03-15 DIAGNOSIS — Z170 Estrogen receptor positive status [ER+]: Secondary | ICD-10-CM | POA: Diagnosis not present

## 2024-03-15 DIAGNOSIS — C3431 Malignant neoplasm of lower lobe, right bronchus or lung: Secondary | ICD-10-CM | POA: Diagnosis not present

## 2024-03-15 DIAGNOSIS — Z803 Family history of malignant neoplasm of breast: Secondary | ICD-10-CM | POA: Diagnosis not present

## 2024-03-15 DIAGNOSIS — Z5111 Encounter for antineoplastic chemotherapy: Secondary | ICD-10-CM | POA: Insufficient documentation

## 2024-03-15 DIAGNOSIS — Z1722 Progesterone receptor negative status: Secondary | ICD-10-CM | POA: Diagnosis not present

## 2024-03-15 DIAGNOSIS — Z79811 Long term (current) use of aromatase inhibitors: Secondary | ICD-10-CM | POA: Diagnosis not present

## 2024-03-15 DIAGNOSIS — K5900 Constipation, unspecified: Secondary | ICD-10-CM | POA: Insufficient documentation

## 2024-03-15 LAB — CBC WITH DIFFERENTIAL (CANCER CENTER ONLY)
Abs Immature Granulocytes: 0.02 K/uL (ref 0.00–0.07)
Basophils Absolute: 0.1 K/uL (ref 0.0–0.1)
Basophils Relative: 1 %
Eosinophils Absolute: 0.2 K/uL (ref 0.0–0.5)
Eosinophils Relative: 3 %
HCT: 39.2 % (ref 36.0–46.0)
Hemoglobin: 13.2 g/dL (ref 12.0–15.0)
Immature Granulocytes: 0 %
Lymphocytes Relative: 24 %
Lymphs Abs: 1.8 K/uL (ref 0.7–4.0)
MCH: 30.6 pg (ref 26.0–34.0)
MCHC: 33.7 g/dL (ref 30.0–36.0)
MCV: 90.7 fL (ref 80.0–100.0)
Monocytes Absolute: 0.9 K/uL (ref 0.1–1.0)
Monocytes Relative: 11 %
Neutro Abs: 4.7 K/uL (ref 1.7–7.7)
Neutrophils Relative %: 61 %
Platelet Count: 376 K/uL (ref 150–400)
RBC: 4.32 MIL/uL (ref 3.87–5.11)
RDW: 13.4 % (ref 11.5–15.5)
WBC Count: 7.8 K/uL (ref 4.0–10.5)
nRBC: 0 % (ref 0.0–0.2)

## 2024-03-15 LAB — CMP (CANCER CENTER ONLY)
ALT: 13 U/L (ref 0–44)
AST: 20 U/L (ref 15–41)
Albumin: 3.5 g/dL (ref 3.5–5.0)
Alkaline Phosphatase: 78 U/L (ref 38–126)
Anion gap: 10 (ref 5–15)
BUN: 20 mg/dL (ref 8–23)
CO2: 24 mmol/L (ref 22–32)
Calcium: 9.3 mg/dL (ref 8.9–10.3)
Chloride: 103 mmol/L (ref 98–111)
Creatinine: 0.46 mg/dL (ref 0.44–1.00)
GFR, Estimated: >60 mL/min (ref >60)
Glucose, Bld: 99 mg/dL (ref 70–99)
Potassium: 4 mmol/L (ref 3.5–5.1)
Sodium: 137 mmol/L (ref 135–145)
Total Bilirubin: 0.4 mg/dL (ref 0.0–1.2)
Total Protein: 7.5 g/dL (ref 6.5–8.1)

## 2024-03-15 MED ORDER — DIPHENHYDRAMINE HCL 50 MG/ML IJ SOLN
50.0000 mg | Freq: Once | INTRAMUSCULAR | Status: AC
  Administered 2024-03-15: 50 mg via INTRAVENOUS
  Filled 2024-03-15: qty 1

## 2024-03-15 MED ORDER — FAMOTIDINE IN NACL 20-0.9 MG/50ML-% IV SOLN
20.0000 mg | Freq: Once | INTRAVENOUS | Status: AC
  Administered 2024-03-15: 20 mg via INTRAVENOUS
  Filled 2024-03-15: qty 50

## 2024-03-15 MED ORDER — SODIUM CHLORIDE 0.9 % IV SOLN
108.6000 mg | Freq: Once | INTRAVENOUS | Status: AC
  Administered 2024-03-15: 110 mg via INTRAVENOUS
  Filled 2024-03-15: qty 11

## 2024-03-15 MED ORDER — DEXAMETHASONE SODIUM PHOSPHATE 10 MG/ML IJ SOLN
10.0000 mg | Freq: Once | INTRAMUSCULAR | Status: AC
  Administered 2024-03-15: 10 mg via INTRAVENOUS
  Filled 2024-03-15: qty 1

## 2024-03-15 MED ORDER — SODIUM CHLORIDE 0.9 % IV SOLN
INTRAVENOUS | Status: DC
Start: 2024-03-15 — End: 2024-03-15
  Filled 2024-03-15 (×2): qty 250

## 2024-03-15 MED ORDER — SODIUM CHLORIDE 0.9 % IV SOLN
45.0000 mg/m2 | Freq: Once | INTRAVENOUS | Status: AC
  Administered 2024-03-15: 54 mg via INTRAVENOUS
  Filled 2024-03-15: qty 9

## 2024-03-15 MED ORDER — PALONOSETRON HCL INJECTION 0.25 MG/5ML
0.2500 mg | Freq: Once | INTRAVENOUS | Status: AC
  Administered 2024-03-15: 0.25 mg via INTRAVENOUS
  Filled 2024-03-15: qty 5

## 2024-03-15 NOTE — Assessment & Plan Note (Addendum)
#   AUG 2025-[ [facility]] Dr. Tamea. POSITIVE FOR MALIGNANCY- SQUAMOUS CELL CARCINOMA- CT RIGHT lower lobe, 6.4 cm greatest   PET scan:  Hypermetabolic right lower lobe mass and probable right hilar adenopathy, findings consistent with T2 be N1 M0 or stage II B primary bronchogenic carcinoma.  AUG brain MRI-NEG. Treatment: Definitive weekly carbotaxol with the daily radiation. [Start- 03/15/24]  # Proceed with cycle #1 of CarboTaxol weekly. Labs-CBC/chemistries were reviewed with the patient.  # APRIL 2024-left breast stage I high risk- INVASIVE MAMMARY CARCINOMA, s/p mastectomy [Dr.Rodenberg]; history of bilateral mastectomy - on adjuvant anastrozole  x10 days.-Stable.   # Bone health- recommend BMD; but declines- recommend calcium 1200 mg/day; along with high dose vit D.   # active smoker: < 1ppd; recommend quitting-   # IV ACCESS: s/p port placement.   weekly-  labs/port- MD  chemo- x3 at  time- PS-  # DISPOSITION: # chemo today- wait for chem- I cannot sign the order # as per IS- weekly-  labs/port- MD  chemo--Dr.B

## 2024-03-15 NOTE — Progress Notes (Signed)
 Patient has no concerns

## 2024-03-15 NOTE — Progress Notes (Signed)
 [facility] CONSULT NOTE  Patient Care Team: Odell Chard, Edra GRADE, MD as PCP - General (Family Medicine) Dellie Louanne MATSU, MD (General Surgery) Cindie Jesusa HERO, RN as Registered Nurse Georgina Shasta POUR, RN as Oncology Nurse Navigator Rennie Cindy SAUNDERS, MD as Consulting Physician (Oncology) Verdene Gills, RN as Oncology Nurse Navigator  CHIEF COMPLAINTS/PURPOSE OF CONSULTATION: Lung cancer/breast cancer  #  Oncology History Overview Note  BRCA negative 2013    Breast cancer [facility]) 2005    left breast ca   Breast cancer [facility]) 2006, 1999    right breast ca   Cancer [facility]) [phone]   # Left breast- 1999 mild ducts- s/p lumpectomy; s/p RT- no chemo [Dr.Sankar/Dr.Byrnett]  # 2006 ? Stage- s/p right mastectomy s/p chemo [Dr.Choksi]; Dr.Chrystal     DIAGNOSIS: A.  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 Size of invasive carcinoma: 2 mm Mm in this sample Histologic grade of invasive carcinoma: Grade 2                      Glandular/tubular differentiation score: 3                      Nuclear pleomorphism score: 3                      Mitotic rate score: 1                      Total score: 7 Ductal carcinoma in situ: Present, high-grade comedo type with associated calcifications Lymphovascular invasion: Not identified  Comment: The definitive grade will be assigned on the excisional specimen. ER/PR/HER2: Immunohistochemistry will be performed on block A2, with reflex to FISH for HER2 2+. The results will be reported in an addendum.  GROSS DESCRIPTION: A. Labeled: Left breast stereo biopsy calcs upper outer quadrant mid depth Received: in a formalin-filled Brevera collection device Specimen radiograph image(s) available for review Time/Date in fixative: Collected at 10:58 AM on 10/23/2022 and placed in formalin at 11:01 AM on 10/23/2022 Cold ischemic time: Less  than 5 minutes Total fixation time: Approximately 9.25 hours Core pieces: Multiple Measurement: Aggregate, 8.5 x 1.5 x 0.3 cm Description / comments: Received are cores and fragments of yellow fibrofatty tissue.  The accompanying diagram has sections B and G checked. Inked: Green Entirely submitted in cassette(s):  1 - section B 2 - section G 3 - 7 - remaining tissue fragments      3 - sections A and C      4 - sections D and E      5 - sections F and H      6 - sections I and J      7 - sections K and L with remaining free-floating fragments  RB 10/23/2022  Final Diagnosis performed by Rexene Daily, MD.   Electronically signed 10/24/2022 10:40:25AM The electronic signature indicates that the named Attending Pathologist has evaluated the specimen Technical component performed at [facility], [address], [place], [place] [address] Lab: [phone] Dir: Frankey Sas, MD, MMM  Professional component performed at [facility], [facility], [address] [place], [place], [place] [address] Lab: [phone] Dir: Wilkie EMERSON Molt, MD  ADDENDUM: CASE SUMMARY: BREAST BIOMARKER TESTS Estrogen Receptor (ER) Status: POSITIVE         Percentage of cells with nuclear positivity: Greater than 90%  Average intensity of staining: Strong  Progesterone Receptor (PgR) Status: NEGATIVE (LESS THAN 1%)         Internal control cells present and stain as expected  HER2 (by immunohistochemistry): NEGATIVE (Score 1+)  Histologic Type: Invasive carcinoma of no special type (ductal) Histologic Grade (Nottingham Histologic Score)      Glandular (Acinar)/Tubular Differentiation: 3      Nuclear Pleomorphism: 3      Mitotic Rate: 3      Overall Grade: Grade 3 Tumor Size: At least 17 mm Tumor Focality: Single focus of invasive carcinoma Ductal Carcinoma In Situ (DCIS): Present      Positive for extensive intraductal component (EIC) Tumor Extent: Not applicable Lymphatic and/or  Vascular Invasion: Not identified  Treatment Effect in the Breast: No known presurgical therapy  MARGINS Margin Status for Invasive Carcinoma: All margins negative for invasive carcinoma   APRIL/MAY 2024-left breast invasive mammary carcinoma s/p mastectomy [Dr. Rodenberg]-at least pT1c [17 mm]; 2 sentinel lymph nodes negative; grade 3; Oncotype 35 high risk-declined chemotherapy.   # # AUG 2025-[ LCSP] Dr. Tamea. POSITIVE FOR MALIGNANCY- SQUAMOUS CELL CARCINOMA- CT RIGHT lower lobe, 6.4 cm greatest   PET scan:  Hypermetabolic right lower lobe mass and probable right hilar adenopathy, findings consistent with T2 be N1 M0 or stage II B primary bronchogenic carcinoma.  AUG brain MRI-NEG.   # Treatment: Definitive weekly carbotaxol with the daily radiation. [Start- 03/15/24]       Carcinoma of upper-outer quadrant of left breast in female, estrogen receptor positive (HCC)  10/30/2022 Initial Diagnosis   Carcinoma of upper-outer quadrant of left breast in female, estrogen receptor positive   10/30/2022 Cancer Staging   Staging form: Breast, AJCC 8th Edition - Clinical: Stage IA (cT1a, cN0, cM0, G2, ER+, PR-, HER2-) - Signed by Rennie Cindy SAUNDERS, MD on 10/30/2022 Histologic grading system: 3 grade system   12/05/2022 - 12/05/2022 Chemotherapy   Patient is on Treatment Plan : BREAST TC q21d     Primary cancer of right lower lobe of lung (HCC)  02/23/2024 Initial Diagnosis   Primary cancer of right lower lobe of lung (HCC)   02/23/2024 Cancer Staging   Staging form: Lung, AJCC V9 - Clinical: Stage IIB (cT2b, cN1, cM0) - Signed by Rennie Cindy SAUNDERS, MD on 02/23/2024   03/15/2024 -  Chemotherapy   Patient is on Treatment Plan : LUNG Carboplatin  + Paclitaxel  + XRT q7d      HISTORY OF PRESENTING ILLNESS: Patient ambulating-independently.  Alone.   Gabriella Green 73 y.o.  female patient  with history of breast cancer stage I on anastrozole  and right lower lobe lung cancer stage IIb  -currently on definitive chemoradiation is here for follow-up.  In the interim underwent evaluation with radiation oncology.  She is also status post port placement.  Denies any worsening shortness of breath or cough.  Appetite fair.  No weight loss.  Review of Systems  Constitutional:  Negative for chills, diaphoresis, fever, malaise/fatigue and weight loss.  HENT:  Negative for nosebleeds and sore throat.   Eyes:  Negative for double vision.  Respiratory:  Positive for cough. Negative for hemoptysis, sputum production, shortness of breath and wheezing.   Cardiovascular:  Negative for chest pain, palpitations, orthopnea and leg swelling.  Gastrointestinal:  Negative for abdominal pain, blood in stool, constipation, diarrhea, heartburn, melena, nausea and vomiting.  Genitourinary:  Negative for dysuria, frequency and urgency.  Musculoskeletal:  Positive for back pain and joint pain.  Skin: Negative.  Negative for itching and rash.  Neurological:  Negative for dizziness, tingling, focal weakness, weakness and headaches.  Endo/Heme/Allergies:  Does not bruise/bleed easily.  Psychiatric/Behavioral:  Negative for depression. The patient is not nervous/anxious and does not have insomnia.      MEDICAL HISTORY:  Past Medical History:  Diagnosis Date   BRCA negative 2013   Breast cancer (HCC) 2005   left breast ca   Breast cancer (HCC) 2006, 1999   right breast ca   COPD (chronic obstructive pulmonary disease) (HCC)    Mass of right lung    Multiple lung nodules    Personal history of chemotherapy    2006 rt breast ca   Personal history of radiation therapy    PONV (postoperative nausea and vomiting)    RBBB (right bundle branch block)     SURGICAL HISTORY: Past Surgical History:  Procedure Laterality Date   BREAST BIOPSY Left 2019    ADENOSIS, SKELETAL MUSCLE of LEFT breast   BREAST BIOPSY Left 10/23/2022   Left Breast Stereo Bx, X clip - path pending   BREAST BIOPSY Left  10/23/2022   MM LT BREAST BX W LOC DEV 1ST LESION IMAGE BX SPEC STEREO GUIDE 10/23/2022 [facility]   BREAST CYST ASPIRATION Bilateral    BREAST EXCISIONAL BIOPSY Right 1999   rad breast ca   BREAST EXCISIONAL BIOPSY Left 2005   rad breast ca   BREAST EXCISIONAL BIOPSY Right 2006   mastectomy chemo    BREAST LUMPECTOMY Left 2005   1999 right lumpectomy   CHOLECYSTECTOMY  2002   COLONOSCOPY  2009   COLONOSCOPY WITH PROPOFOL  N/A 09/03/2018   Procedure: COLONOSCOPY WITH PROPOFOL ;  Surgeon: Unk Corinn Skiff, MD;  Location: [facility];  Service: Gastroenterology;  Laterality: N/A;   ENDOBRONCHIAL ULTRASOUND Bilateral 02/16/2024   Procedure: ENDOBRONCHIAL ULTRASOUND (EBUS);  Surgeon: Tamea Dedra CROME, MD;  Location: [facility];  Service: Pulmonary;  Laterality: Bilateral;   ESOPHAGOGASTRODUODENOSCOPY (EGD) WITH PROPOFOL  N/A 09/03/2018   Procedure: ESOPHAGOGASTRODUODENOSCOPY (EGD) WITH PROPOFOL ;  Surgeon: Unk Corinn Skiff, MD;  Location: [facility];  Service: Gastroenterology;  Laterality: N/A;   IR IMAGING GUIDED PORT INSERTION  03/04/2024   MASTECTOMY Right 2006   MASTECTOMY W/ SENTINEL NODE BIOPSY Left 11/13/2022   Procedure: MASTECTOMY WITH SENTINEL LYMPH NODE BIOPSY, total mastectomy;  Surgeon: Lane Shope, MD;  Location: [facility] OR[facility];  Service: General;  Laterality: Left;   VIDEO BRONCHOSCOPY WITH ENDOBRONCHIAL NAVIGATION Bilateral 02/16/2024   Procedure: VIDEO BRONCHOSCOPY WITH ENDOBRONCHIAL NAVIGATION;  Surgeon: Tamea Dedra CROME, MD;  Location: [facility];  Service: Pulmonary;  Laterality: Bilateral;    SOCIAL HISTORY: Social History   Socioeconomic History   Marital status: Widowed    Spouse name: Not on file   Number of children: Not on file   Years of education: Not on file   Highest education level: Not on file  Occupational History   Not on file  Tobacco Use   Smoking status: Every Day    Current packs/day: 0.75    Average packs/day: 0.8 packs/day for 20.0  years (15.0 ttl pk-yrs)    Types: Cigarettes    Passive exposure: Past   Smokeless tobacco: Never   Tobacco comments:    0.75 PPD - khj 02/06/2024        Started smoking in her late 20's.    Smoked 1PPD at her heaviest.  Vaping Use   Vaping status: Never Used  Substance and Sexual Activity   Alcohol use: No   Drug  use: No   Sexual activity: Not on file  Other Topics Concern   Not on file  Social History Narrative   Not on file   Social Drivers of Health   Financial Resource Strain: Not on file  Food Insecurity: No Food Insecurity (10/30/2022)   Hunger Vital Sign    Worried About Running Out of Food in the Last Year: Never true    Ran Out of Food in the Last Year: Never true  Transportation Needs: No Transportation Needs (10/30/2022)   PRAPARE - Administrator, Civil Service (Medical): No    Lack of Transportation (Non-Medical): No  Physical Activity: Not on file  Stress: Not on file  Social Connections: Not on file  Intimate Partner Violence: Not on file    FAMILY HISTORY: Family History  Problem Relation Age of Onset   Breast cancer Mother 76   Breast cancer Sister 28   Breast cancer Maternal Aunt 40    ALLERGIES:  is allergic to codeine and tape.  MEDICATIONS:  Current Outpatient Medications  Medication Sig Dispense Refill   anastrozole  (ARIMIDEX ) 1 MG tablet TAKE 1 TABLET(1 MG) BY MOUTH DAILY (Patient taking differently: Take 1 mg by mouth at bedtime.) 90 tablet 1   aspirin EC 81 MG tablet Take 81 mg by mouth at bedtime. Swallow whole.     lidocaine -prilocaine  (EMLA ) cream Apply to affected area once 30 g 3   ondansetron  (ZOFRAN ) 8 MG tablet Take 1 tablet (8 mg total) by mouth every 8 (eight) hours as needed for nausea or vomiting. Start on the third day after chemotherapy. 30 tablet 1   ondansetron  (ZOFRAN -ODT) 4 MG disintegrating tablet Take 4 mg by mouth every 8 (eight) hours as needed.     prochlorperazine  (COMPAZINE ) 10 MG tablet Take 1 tablet (10  mg total) by mouth every 6 (six) hours as needed for nausea or vomiting. 30 tablet 1   Vitamin D , Ergocalciferol , (DRISDOL ) 1.25 MG (50000 UNIT) CAPS capsule Take 50,000 Units by mouth once a week.     No current facility-administered medications for this visit.     PHYSICAL EXAMINATION:   Vitals:   03/15/24 0831  BP: 134/70  Pulse: 81  Resp: 20  Temp: 98.6 F (37 C)  SpO2: 100%       Filed Weights   03/15/24 0831  Weight: 80 lb 12.8 oz (36.7 kg)     Physical Exam Vitals and nursing note reviewed.  HENT:     Head: Normocephalic and atraumatic.     Mouth/Throat:     Pharynx: Oropharynx is clear.  Eyes:     Extraocular Movements: Extraocular movements intact.     Pupils: Pupils are equal, round, and reactive to light.  Cardiovascular:     Rate and Rhythm: Normal rate and regular rhythm.  Pulmonary:     Comments: Decreased breath sounds bilaterally.  Abdominal:     Palpations: Abdomen is soft.  Musculoskeletal:        General: Normal range of motion.     Cervical back: Normal range of motion.  Skin:    General: Skin is warm.  Neurological:     General: No focal deficit present.     Mental Status: She is alert and oriented to person, place, and time.  Psychiatric:        Behavior: Behavior normal.        Judgment: Judgment normal.      LABORATORY DATA:  I have reviewed the data  as listed Lab Results  Component Value Date   WBC 7.8 03/15/2024   HGB 13.2 03/15/2024   HCT 39.2 03/15/2024   MCV 90.7 03/15/2024   PLT 376 03/15/2024   Recent Labs    12/08/23 1503 03/15/24 0816  NA 138 137  K 3.8 4.0  CL 102 103  CO2 28 24  GLUCOSE 130* 99  BUN 15 20  CREATININE 0.76 0.46  CALCIUM 9.3 9.3  GFRNONAA >60 >60  PROT 7.7 7.5  ALBUMIN 3.7 3.5  AST 17 20  ALT 12 13  ALKPHOS 78 78  BILITOT 0.4 0.4     RADIOGRAPHIC STUDIES: I have personally reviewed the radiological images as listed and agreed with the findings in the report. MR Brain W Wo  Contrast Result Date: 03/08/2024 EXAM: MRI BRAIN WITH AND WITHOUT CONTRAST 02/26/2024 04:28:48 PM TECHNIQUE: Multiplanar multisequence MRI of the head/brain was performed with and without the administration of intravenous contrast. COMPARISON: CT of the head dated 07/18/2021. CLINICAL HISTORY: Metastatic disease evaluation. Lung ca staging. Hx breast ca. NKI No surgery No symptoms per patient. FINDINGS: BRAIN AND VENTRICLES: No acute infarct. No acute intracranial hemorrhage. No mass effect or midline shift. No hydrocephalus. The sella is unremarkable. Normal flow voids. No mass or abnormal enhancement. Moderate diffuse cerebral white matter disease present. ORBITS: No acute abnormality. SINUSES: No acute abnormality. BONES AND SOFT TISSUES: Normal bone marrow signal and enhancement. No acute soft tissue abnormality. IMPRESSION: 1. No acute intracranial abnormality. No findings concerning for metastatic disease. 2. Moderate diffuse cerebral white matter disease. Electronically signed by: Evalene Coho MD 03/08/2024 02:42 PM EDT RP Workstation: [id number]   IR IMAGING GUIDED PORT INSERTION Result Date: 03/04/2024 CLINICAL DATA:  Squamous lung carcinoma of the right lower lobe and need for porta cath for chemotherapy. EXAM: IMPLANTED PORT A CATH PLACEMENT WITH ULTRASOUND AND FLUOROSCOPIC GUIDANCE ANESTHESIA/SEDATION: Moderate (conscious) sedation was employed during this procedure. A total of Versed  2.0 mg and Fentanyl  100 mcg was administered intravenously. Moderate Sedation Time: 22 minutes. The patient's level of consciousness and vital signs were monitored continuously by radiology nursing throughout the procedure under my direct supervision. FLUOROSCOPY: Radiation Exposure Index: 1.0 mGy Kerma PROCEDURE: The procedure, risks, benefits, and alternatives were explained to the patient. Questions regarding the procedure were encouraged and answered. The patient understands and consents to the procedure. A  time-out was performed prior to initiating the procedure. Ultrasound was utilized to confirm patency of the left internal jugular vein. An ultrasound image was saved and recorded. The left neck and chest were prepped with chlorhexidine  in a sterile fashion, and a sterile drape was applied covering the operative field. Maximum barrier sterile technique with sterile gowns and gloves were used for the procedure. Local anesthesia was provided with 1% lidocaine . After creating a small venotomy incision, a 21 gauge needle was advanced into the left internal jugular vein under direct, real-time ultrasound guidance. Ultrasound image documentation was performed. After securing guidewire access, an 8 Fr dilator was placed. A J-wire was kinked to measure appropriate catheter length. A subcutaneous port pocket was then created along the upper chest wall utilizing sharp and blunt dissection. Portable cautery was utilized. The pocket was irrigated with sterile saline. A single lumen power injectable port was chosen for placement. The 8 Fr catheter was tunneled from the port pocket site to the venotomy incision. The port was placed in the pocket. External catheter was trimmed to appropriate length based on guidewire measurement. At the venotomy, an 8 Fr  peel-away sheath was placed over a guidewire. The catheter was then placed through the sheath and the sheath removed. Final catheter positioning was confirmed and documented with a fluoroscopic spot image. The port was accessed with a needle and aspirated and flushed with heparinized saline. The access needle was removed. The venotomy and port pocket incisions were closed with subcutaneous 3-0 Monocryl and subcuticular 4-0 Vicryl. Dermabond was applied to both incisions. COMPLICATIONS: COMPLICATIONS None FINDINGS: After catheter placement, the tip lies at the cavo-atrial junction. The catheter aspirates normally and is ready for immediate use. IMPRESSION: Placement of single lumen  port a cath via left internal jugular vein. The catheter tip lies at the cavo-atrial junction. A power injectable port a cath was placed and is ready for immediate use. Electronically Signed   By: Marcey Moan M.D.   On: 03/04/2024 14:45   NM PET Image Initial (PI) Skull Base To Thigh (F-18 FDG) Result Date: 02/23/2024 CLINICAL DATA:  Initial treatment strategy for lung lesion. EXAM: NUCLEAR MEDICINE PET SKULL BASE TO THIGH TECHNIQUE: 5.8 mCi F-18 FDG was injected intravenously. Full-ring PET imaging was performed from the skull base to thigh after the radiotracer. CT data was obtained and used for attenuation correction and anatomic localization. Fasting blood glucose: 80 mg/dl COMPARISON:  CT chest 91/91/7974, 01/19/2024. FINDINGS: Mediastinal blood pool activity: SUV max 1.9 Liver activity: SUV max NA NECK: No abnormal hypermetabolism. Incidental CT findings: None. CHEST: Hypermetabolic right lower lobe mass measures 4.2 x 4.5 cm, SUV max 16.0. Mild right hilar hypermetabolism, SUV max 2.5. No additional abnormal hypermetabolism. Incidental CT findings: Atherosclerotic calcification of the aorta. Heart is enlarged. No pericardial or pleural effusion. Centrilobular and paraseptal emphysema. ABDOMEN/PELVIS: No abnormal hypermetabolism. Incidental CT findings: Cholecystectomy. SKELETON: No abnormal hypermetabolism. Incidental CT findings: Osteopenia.  Degenerative changes in the spine. IMPRESSION: 1. Hypermetabolic right lower lobe mass and probable right hilar adenopathy, findings consistent with T2 be N1 M0 or stage II B primary bronchogenic carcinoma. 2.  Aortic atherosclerosis (ICD10-I70.0). 3.  Emphysema (ICD10-J43.9). Electronically Signed   By: Newell Eke M.D.   On: 02/23/2024 09:19   DG Chest Port 1 View Result Date: 02/16/2024 EXAM: 1 VIEW XRAY OF THE CHEST 02/16/2024 02:22:51 PM COMPARISON: Lung cancer screening CT of 01/19/2024. CLINICAL HISTORY: S/P bronchoscopy. FINDINGS: LUNGS AND PLEURA:  Moderate pulmonary interstitial thickening. Soft tissue fullness projecting over the right hilum corresponds to the right lower lobe lung mass on CT. HEART AND MEDIASTINUM: Atherosclerosis in the transverse aorta. BONES AND SOFT TISSUES: Multiple wires and leads project over the chest on the frontal radiograph. No acute osseous abnormality. IMPRESSION: 1. Right lower lobe lung mass, corresponding to soft tissue fullness projecting over the right hilum. 2. Moderate pulmonary interstitial thickening likely COPD / chronic bronchitis 3.  no pneumothorax or other acute complication after bronchoscopy. Electronically signed by: Rockey Kilts MD 02/16/2024 03:07 PM EDT RP Workstation: [id number]   DG C-Arm 1-60 Min-No Report Result Date: 02/16/2024 Fluoroscopy was utilized by the requesting physician.  No radiographic interpretation.    ASSESSMENT & PLAN:   Primary cancer of right lower lobe of lung (HCC) # AUG 2025-[ [facility]] Dr. Tamea. POSITIVE FOR MALIGNANCY- SQUAMOUS CELL CARCINOMA- CT RIGHT lower lobe, 6.4 cm greatest   PET scan:  Hypermetabolic right lower lobe mass and probable right hilar adenopathy, findings consistent with T2 be N1 M0 or stage II B primary bronchogenic carcinoma.  AUG brain MRI-NEG. Treatment: Definitive weekly carbotaxol with the daily radiation. [Start- 03/15/24]  # Proceed  with cycle #1 of CarboTaxol weekly. Labs-CBC/chemistries were reviewed with the patient.  # APRIL 2024-left breast stage I high risk- INVASIVE MAMMARY CARCINOMA, s/p mastectomy [Dr.Rodenberg]; history of bilateral mastectomy - on adjuvant anastrozole  x10 days.-Stable.   # Bone health- recommend BMD; but declines- recommend calcium 1200 mg/day; along with high dose vit D.   # active smoker: < 1ppd; recommend quitting-   # IV ACCESS: s/p port placement.   weekly-  labs/port- MD  chemo- x3 at  time- PS-  # DISPOSITION: # chemo today- wait for chem- I cannot sign the order # as per IS- weekly-  labs/port-  MD  chemo--Dr.B   All questions were answered. The patient/family knows to call the clinic with any problems, questions or concerns.    Cindy JONELLE Joe, MD 03/15/2024 9:19 AM

## 2024-03-15 NOTE — Patient Instructions (Signed)
 [facility] - A DEPT OF [facility]  Discharge Instructions: Thank you for choosing [facility] to provide your oncology and hematology care.  If you have a lab appointment with the Cancer Center, please go directly to the Cancer Center and check in at the registration area.  Wear comfortable clothing and clothing appropriate for easy access to any Portacath or PICC line.   We strive to give you quality time with your provider. You may need to reschedule your appointment if you arrive late (15 or more minutes).  Arriving late affects you and other patients whose appointments are after yours.  Also, if you miss three or more appointments without notifying the office, you may be dismissed from the clinic at the provider's discretion.      For prescription refill requests, have your pharmacy contact our office and allow 72 hours for refills to be completed.    Today you received the following chemotherapy and/or immunotherapy agents- taxol, carboplatin      To help prevent nausea and vomiting after your treatment, we encourage you to take your nausea medication as directed.  BELOW ARE SYMPTOMS THAT SHOULD BE REPORTED IMMEDIATELY: *FEVER GREATER THAN 100.4 F (38 C) OR HIGHER *CHILLS OR SWEATING *NAUSEA AND VOMITING THAT IS NOT CONTROLLED WITH YOUR NAUSEA MEDICATION *UNUSUAL SHORTNESS OF BREATH *UNUSUAL BRUISING OR BLEEDING *URINARY PROBLEMS (pain or burning when urinating, or frequent urination) *BOWEL PROBLEMS (unusual diarrhea, constipation, pain near the anus) TENDERNESS IN MOUTH AND THROAT WITH OR WITHOUT PRESENCE OF ULCERS (sore throat, sores in mouth, or a toothache) UNUSUAL RASH, SWELLING OR PAIN  UNUSUAL VAGINAL DISCHARGE OR ITCHING   Items with * indicate a potential emergency and should be followed up as soon as possible or go to the Emergency Department if any problems should occur.  Please show the CHEMOTHERAPY ALERT CARD or  IMMUNOTHERAPY ALERT CARD at check-in to the Emergency Department and triage nurse.  Should you have questions after your visit or need to cancel or reschedule your appointment, please contact [facility] - A DEPT OF [facility]  [phone] and follow the prompts.  Office hours are 8:00 a.m. to 4:30 p.m. Monday - Friday. Please note that voicemails left after 4:00 p.m. may not be returned until the following business day.  We are closed weekends and major holidays. You have access to a nurse at all times for urgent questions. Please call the main number to the clinic [phone] and follow the prompts.  For any non-urgent questions, you may also contact your provider using MyChart. We now offer e-Visits for anyone 36 and older to request care online for non-urgent symptoms. For details visit [email or web address].   Also download the MyChart app! Go to the app store, search "MyChart", open the app, select [place], and log in with your MyChart username and password.

## 2024-03-16 ENCOUNTER — Encounter

## 2024-03-16 ENCOUNTER — Other Ambulatory Visit: Payer: Self-pay

## 2024-03-16 ENCOUNTER — Ambulatory Visit
Admission: RE | Admit: 2024-03-16 | Discharge: 2024-03-16 | Disposition: A | Discharge status: Home / Self Care | Source: Ambulatory Visit | Attending: Radiation Oncology | Admitting: Radiation Oncology

## 2024-03-16 ENCOUNTER — Telehealth: Payer: Self-pay

## 2024-03-16 DIAGNOSIS — C3431 Malignant neoplasm of lower lobe, right bronchus or lung: Secondary | ICD-10-CM | POA: Diagnosis not present

## 2024-03-16 LAB — RAD ONC ARIA SESSION SUMMARY
Course Elapsed Days: 0
Plan Fractions Treated to Date: 1
Plan Prescribed Dose Per Fraction: 2 Gy
Plan Total Fractions Prescribed: 33
Plan Total Prescribed Dose: 66 Gy
Reference Point Dosage Given to Date: 2 Gy
Reference Point Session Dosage Given: 2 Gy
Session Number: 1

## 2024-03-16 NOTE — Telephone Encounter (Signed)
Telephone call to patient for follow up after receiving first infusion.   Patient states infusion went great.  States eating good and drinking plenty of fluids.   Denies any nausea or vomiting.  Encouraged patient to call for any concerns or questions. 

## 2024-03-17 ENCOUNTER — Ambulatory Visit
Admission: RE | Admit: 2024-03-17 | Discharge: 2024-03-17 | Disposition: A | Discharge status: Home / Self Care | Source: Ambulatory Visit | Attending: Radiation Oncology | Admitting: Radiation Oncology

## 2024-03-17 ENCOUNTER — Inpatient Hospital Stay

## 2024-03-17 ENCOUNTER — Other Ambulatory Visit: Payer: Self-pay

## 2024-03-17 DIAGNOSIS — C3431 Malignant neoplasm of lower lobe, right bronchus or lung: Secondary | ICD-10-CM | POA: Diagnosis not present

## 2024-03-17 LAB — RAD ONC ARIA SESSION SUMMARY
Course Elapsed Days: 1
Plan Fractions Treated to Date: 2
Plan Prescribed Dose Per Fraction: 2 Gy
Plan Total Fractions Prescribed: 33
Plan Total Prescribed Dose: 66 Gy
Reference Point Dosage Given to Date: 4 Gy
Reference Point Session Dosage Given: 2 Gy
Session Number: 2

## 2024-03-17 NOTE — Progress Notes (Signed)
 Nutrition Assessment   Reason for Assessment:  Lung Cancer, concern for malnutrition  ASSESSMENT:  73 year old female with lung cancer. History of bilateral mastectomy, COPD.  Patient receiving concurrent chemotherapy and radiation.    Met with patient following chemotherapy.  Reports that she does not eat very much at one time.  Usually takes peanut butter crackers or sandwich with her to work along with snack cake, banana, coffee and 2 boost shakes.  At 12:30 will have leftovers from night before maybe bowl of beans.  Supper cooks meat and couple of sides as grandson lives with her.  Says that she is not very hungry.  Denies nausea. Has regular bowel movement   Medications: compazine , zofran , armidex   Labs: Vit D 139 high 12/08/23   Anthropometrics:   Height: 61 inches Weight: 80 lb 12.8 oz on 9/8 Low 80s UBW BMI: 15   Estimated Energy Needs  Kcals: 1200-1400 (using IBW) Protein: 60-70 g Fluid: 1200-1446ml   NUTRITION DIAGNOSIS: Underweight related to chronic illness (COPD, cancer) as evidenced by underweight BMI    INTERVENTION:  Discussed High Calorie, High Protein Diet. Handout provided Encouraged 350 calorie oral nutrition supplement if possible Hold off on MVI as contains Vit D and level elevated Contact information provided   MONITORING, EVALUATION, GOAL: weight trends, intake   Next Visit: Thursday, Oct 2nd after radiation  Claudia Alvizo B. Dasie SOLON, CSO, LDN Registered Dietitian [phone]

## 2024-03-18 ENCOUNTER — Ambulatory Visit (INDEPENDENT_AMBULATORY_CARE_PROVIDER_SITE_OTHER): Admitting: Pulmonary Disease

## 2024-03-18 ENCOUNTER — Ambulatory Visit
Admission: RE | Admit: 2024-03-18 | Discharge: 2024-03-18 | Disposition: A | Discharge status: Home / Self Care | Source: Ambulatory Visit | Attending: Radiation Oncology | Admitting: Radiation Oncology

## 2024-03-18 ENCOUNTER — Encounter: Payer: Self-pay | Admitting: Pulmonary Disease

## 2024-03-18 ENCOUNTER — Other Ambulatory Visit: Payer: Self-pay

## 2024-03-18 VITALS — BP 130/66 | HR 94 | Temp 97.6°F | Ht 61.0 in | Wt 82.0 lb

## 2024-03-18 DIAGNOSIS — J449 Chronic obstructive pulmonary disease, unspecified: Secondary | ICD-10-CM | POA: Diagnosis not present

## 2024-03-18 DIAGNOSIS — J439 Emphysema, unspecified: Secondary | ICD-10-CM

## 2024-03-18 DIAGNOSIS — C3431 Malignant neoplasm of lower lobe, right bronchus or lung: Secondary | ICD-10-CM

## 2024-03-18 DIAGNOSIS — Z853 Personal history of malignant neoplasm of breast: Secondary | ICD-10-CM | POA: Diagnosis not present

## 2024-03-18 DIAGNOSIS — K219 Gastro-esophageal reflux disease without esophagitis: Secondary | ICD-10-CM

## 2024-03-18 DIAGNOSIS — F17210 Nicotine dependence, cigarettes, uncomplicated: Secondary | ICD-10-CM | POA: Diagnosis not present

## 2024-03-18 LAB — RAD ONC ARIA SESSION SUMMARY
Course Elapsed Days: 2
Plan Fractions Treated to Date: 3
Plan Prescribed Dose Per Fraction: 2 Gy
Plan Total Fractions Prescribed: 33
Plan Total Prescribed Dose: 66 Gy
Reference Point Dosage Given to Date: 6 Gy
Reference Point Session Dosage Given: 2 Gy
Session Number: 3

## 2024-03-18 MED ORDER — OMEPRAZOLE 40 MG PO CPDR: 40.0000 mg | DELAYED_RELEASE_CAPSULE | Freq: Every day | ORAL | 2 refills | Status: AC

## 2024-03-18 NOTE — Progress Notes (Signed)
 Subjective:    Patient ID: Gabriella Green, female    DOB: Jun 08, 1951, 73 y.o.   MRN: [id number]  Patient Care Team: Odell Chard, Edra GRADE, MD as PCP - General (Family Medicine) Dellie Louanne MATSU, MD (General Surgery) Cindie Jesusa HERO, RN as Registered Nurse Georgina Shasta POUR, RN as Oncology Nurse Navigator Rennie Cindy SAUNDERS, MD as Consulting Physician (Oncology) Verdene Gills, RN as Oncology Nurse Navigator  Chief Complaint  Patient presents with   Follow-up    BACKGROUND/INTERVAL:Patient is a 73 year old current smoker, and a history as noted below, who presents for follow-up of an abnormal lung cancer screening CT. patient was noted to have a right lung mass.  She underwent robotic assisted navigational bronchoscopy on 16 February 2024, mass diagnosed as squamous cell carcinoma she is undergoing chemo XRT.  She follows with Dr. Rennie.  HPI Discussed the use of AI scribe software for clinical note transcription with the patient, who gave verbal consent to proceed.  History of Present Illness   Gabriella Green is a 73 year old female with COPD and non-small cell lung carcinoma who presents for follow-up regarding her treatment plan.  She is currently undergoing chemotherapy, having had her first treatment on Monday, and is scheduled to receive treatments every Monday. She is also undergoing daily radiation therapy, with plans to adjust the schedule to 1 PM starting the week after next to accommodate her work schedule.  She has not quit smoking but has significantly reduced her smoking habits and is attempting to quit entirely.  She is concerned about the radiation treatment's impact on her skin and throat, noting that her throat is already becoming 'a little scratchy.' She reports that the radiation is being focused on the right side, and she is concerned about possible effects on her throat and swallowing, as she has been told it could cause difficulty swallowing and her  throat is already becoming 'a little scratchy.'  She is responsible for taking care of fewer people at home now, as her niece has taken over some responsibilities, allowing her to focus more on her health. 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     Review of Systems A 10 point review of systems was performed and it is as noted above otherwise negative.   Patient Active Problem List   Diagnosis Date Noted   Primary cancer of right lower lobe of lung (HCC) 02/23/2024   Right lower lobe lung mass 02/17/2024   Status post bilateral mastectomy 11/19/2022   Carcinoma of upper-outer quadrant of left breast in female, estrogen receptor positive (HCC) 10/30/2022   History of bilateral breast cancer 03/02/2020   Encounter for screening mammogram for breast cancer 03/02/2020   Nausea    Colon cancer screening    Cancer [facility])     Social History   Tobacco Use   Smoking status: Every Day    Current packs/day: 0.50    Average packs/day: 1 pack/day for 45.7 years (45.7 ttl pk-yrs)    Types: Cigarettes    Start date: 1980    Passive exposure: Past   Smokeless tobacco: Never  Substance Use Topics   Alcohol use: No    Allergies  Allergen Reactions   Codeine Nausea And Vomiting   Tape Hives and Rash    tegaderm     Current Meds  Medication Sig   anastrozole  (ARIMIDEX ) 1 MG tablet TAKE 1 TABLET(1 MG) BY MOUTH  DAILY (Patient taking differently: Take 1 mg by mouth at bedtime.)   aspirin EC 81 MG tablet Take 81 mg by mouth at bedtime. Swallow whole.   lidocaine -prilocaine  (EMLA ) cream Apply to affected area once   omeprazole  (PRILOSEC) 40 MG capsule Take 1 capsule (40 mg total) by mouth daily.   ondansetron  (ZOFRAN ) 8 MG tablet Take 1 tablet (8 mg total) by mouth every 8 (eight) hours as needed for nausea or vomiting. Start on the third day after chemotherapy.   ondansetron  (ZOFRAN -ODT) 4 MG disintegrating tablet  Take 4 mg by mouth every 8 (eight) hours as needed.   prochlorperazine  (COMPAZINE ) 10 MG tablet Take 1 tablet (10 mg total) by mouth every 6 (six) hours as needed for nausea or vomiting.     There is no immunization history on file for this patient.      Objective:     BP 130/66   Pulse 94   Temp 97.6 F (36.4 C) (Temporal)   Ht 5' 1 (1.549 m)   Wt 82 lb (37.2 kg)   SpO2 99%   BMI 15.49 kg/m   SpO2: 99 %  GENERAL: Thin, well-developed woman, no acute distress, fully ambulatory.  Mild use of accessory use of respiration.  No conversational dyspnea. HEAD: Normocephalic, atraumatic.  EYES: Pupils equal, round, reactive to light.  No scleral icterus.  MOUTH: Chipped teeth otherwise intact. NECK: Supple. No thyromegaly. Trachea midline. No JVD.  No adenopathy. PULMONARY: Distant breath sounds.  Coarse, otherwise, no adventitious sounds. CARDIOVASCULAR: S1 and S2. Regular rate and rhythm.  No rubs, murmurs or gallops heard. ABDOMEN: Benign. MUSCULOSKELETAL: No joint deformity, no clubbing, no edema.  NEUROLOGIC: No overt focal deficit, no gait disturbance, speech is fluent. SKIN: Intact,warm,dry. PSYCH: Mood and behavior normal.   Assessment & Plan:     ICD-10-CM   1. COPD suggested by initial evaluation  J44.9 Pulmonary function test    2. Pulmonary emphysema, unspecified emphysema type  J43.9     3. Primary cancer of right lower lobe of lung (HCC)  C34.31     4. History of bilateral breast cancer  Z85.3     5. Gastroesophageal reflux disease, unspecified whether esophagitis present  K21.9     6. Tobacco dependence due to cigarettes  F17.210       Orders Placed This Encounter  Procedures   Pulmonary function test    Standing Status:   Future    Number of Occurrences:   1    Expected Date:   03/25/2024    Expiration Date:   03/18/2025    Where should this test be performed?:   Outpatient Pulmonary    What type of PFT is being ordered?:   Full PFT    Meds  ordered this encounter  Medications   omeprazole  (PRILOSEC) 40 MG capsule    Sig: Take 1 capsule (40 mg total) by mouth daily.    Dispense:  30 capsule    Refill:  2   Smoking/Tobacco Cessation Counseling Gabriella Green is a current user of tobacco or nicotine products. She is considering quitting at this time. Counseling provided today addressed the risks of continued use and the benefits of cessation. Discussed tobacco/nicotine use history, readiness to quit, and evidence-based treatment options including behavioral strategies, support resources, and pharmacologic therapies. Provided encouragement and educational materials on steps and resources to quit smoking. Patient questions were addressed, and follow-up recommended for continued support. Total time spent on counseling: 5 minutes.  Gabriella Green  Discussion:    Right lung non-small cell carcinoma, actively being treated Currently undergoing chemotherapy and radiation therapy. Radiation is targeted to the right lung, specifically low and back, which may affect the esophagus causing potential swallowing difficulties and sore throat. Radiation-induced skin changes are anticipated in the targeted area. - Continue chemotherapy and radiation therapy as scheduled - Prescribe omeprazole  to reduce stomach acid to help prevent esophageal irritation  Chronic obstructive pulmonary disease (COPD) COPD is being monitored, especially in the context of ongoing cancer treatment. - Order pulmonary function tests to establish a baseline for monitoring lung function during treatment  Tobacco use disorder Continues to smoke but has significantly reduced usage. Expressed intention to quit smoking. - Smoking cessation counseling done  Follow-Up Coordination of pulmonary function tests with existing treatment schedule is necessary to minimize inconvenience. - Coordinate scheduling of pulmonary function tests to align with her existing treatment schedule - ISchedule  tests around her treatment times       Advised if symptoms do not improve or worsen, to please contact office for sooner follow up or seek emergency care.    I spent 33 minutes of dedicated to the care of this patient on the date of this encounter to include pre-visit review of records, face-to-face time with the patient discussing conditions above, post visit ordering of testing, clinical documentation with the electronic health record, making appropriate referrals as documented, and communicating necessary findings to members of the patients care team.     C. Leita Sanders, MD Advanced Bronchoscopy P[facility]    *This note was generated using voice recognition software/Dragon and/or AI transcription program.  Despite best efforts to proofread, errors can occur which can change the meaning. Any transcriptional errors that result from this process are unintentional and may not be fully corrected at the time of dictation.

## 2024-03-18 NOTE — Patient Instructions (Signed)
 VISIT SUMMARY:  Today, we discussed your ongoing treatment for non-small cell lung carcinoma and COPD. You are currently undergoing chemotherapy and daily radiation therapy. We also talked about your efforts to quit smoking and the impact of your treatments on your throat and skin.  YOUR PLAN:  -RIGHT LUNG NON-SMALL CELL CARCINOMA: Non-small cell lung carcinoma is a type of lung cancer. You are currently receiving chemotherapy and radiation therapy. The radiation is focused on the right lung and may cause some irritation to your esophagus, leading to a sore throat and potential swallowing difficulties. We will continue with your current treatment plan and have prescribed a tablet to reduce stomach acid to help prevent irritation.  -CHRONIC OBSTRUCTIVE PULMONARY DISEASE (COPD): COPD is a chronic lung condition that makes it hard to breathe. We are monitoring your COPD closely, especially during your cancer treatment. We will conduct pulmonary function tests to establish a baseline for your lung function.  -TOBACCO USE DISORDER: Tobacco use disorder is the dependence on tobacco products. You have significantly reduced your smoking and are trying to quit. This is very important for your overall health, especially during cancer treatment.  INSTRUCTIONS:  Please coordinate the scheduling of your pulmonary function tests with Melissa at checkout to align with your existing treatment schedule. Continue with your chemotherapy and radiation therapy as planned.

## 2024-03-19 ENCOUNTER — Ambulatory Visit
Admission: RE | Admit: 2024-03-19 | Discharge: 2024-03-19 | Disposition: A | Discharge status: Home / Self Care | Source: Ambulatory Visit | Attending: Radiation Oncology | Admitting: Radiation Oncology

## 2024-03-19 ENCOUNTER — Other Ambulatory Visit: Payer: Self-pay

## 2024-03-19 DIAGNOSIS — C3431 Malignant neoplasm of lower lobe, right bronchus or lung: Secondary | ICD-10-CM | POA: Diagnosis not present

## 2024-03-19 LAB — RAD ONC ARIA SESSION SUMMARY
Course Elapsed Days: 3
Plan Fractions Treated to Date: 4
Plan Prescribed Dose Per Fraction: 2 Gy
Plan Total Fractions Prescribed: 33
Plan Total Prescribed Dose: 66 Gy
Reference Point Dosage Given to Date: 8 Gy
Reference Point Session Dosage Given: 2 Gy
Session Number: 4

## 2024-03-22 ENCOUNTER — Ambulatory Visit
Admission: RE | Admit: 2024-03-22 | Discharge: 2024-03-22 | Disposition: A | Discharge status: Home / Self Care | Source: Ambulatory Visit | Attending: Radiation Oncology | Admitting: Radiation Oncology

## 2024-03-22 ENCOUNTER — Encounter: Payer: Self-pay | Admitting: *Deleted

## 2024-03-22 ENCOUNTER — Encounter: Payer: Self-pay | Admitting: Internal Medicine

## 2024-03-22 ENCOUNTER — Inpatient Hospital Stay

## 2024-03-22 ENCOUNTER — Inpatient Hospital Stay (HOSPITAL_BASED_OUTPATIENT_CLINIC_OR_DEPARTMENT_OTHER): Admitting: Internal Medicine

## 2024-03-22 ENCOUNTER — Other Ambulatory Visit: Payer: Self-pay

## 2024-03-22 ENCOUNTER — Other Ambulatory Visit

## 2024-03-22 VITALS — BP 132/64 | HR 77

## 2024-03-22 VITALS — BP 123/71 | HR 78 | Temp 98.5°F | Resp 20 | Ht 61.0 in | Wt 82.7 lb

## 2024-03-22 DIAGNOSIS — C3431 Malignant neoplasm of lower lobe, right bronchus or lung: Secondary | ICD-10-CM

## 2024-03-22 DIAGNOSIS — Z5111 Encounter for antineoplastic chemotherapy: Secondary | ICD-10-CM | POA: Diagnosis not present

## 2024-03-22 LAB — CBC WITH DIFFERENTIAL (CANCER CENTER ONLY)
Abs Immature Granulocytes: 0.1 K/uL — ABNORMAL HIGH (ref 0.00–0.07)
Basophils Absolute: 0.1 K/uL (ref 0.0–0.1)
Basophils Relative: 1 %
Eosinophils Absolute: 0.3 K/uL (ref 0.0–0.5)
Eosinophils Relative: 3 %
HCT: 37.3 % (ref 36.0–46.0)
Hemoglobin: 12.2 g/dL (ref 12.0–15.0)
Immature Granulocytes: 1 %
Lymphocytes Relative: 14 %
Lymphs Abs: 1.2 K/uL (ref 0.7–4.0)
MCH: 30.1 pg (ref 26.0–34.0)
MCHC: 32.7 g/dL (ref 30.0–36.0)
MCV: 92.1 fL (ref 80.0–100.0)
Monocytes Absolute: 0.8 K/uL (ref 0.1–1.0)
Monocytes Relative: 9 %
Neutro Abs: 6.1 K/uL (ref 1.7–7.7)
Neutrophils Relative %: 72 %
Platelet Count: 427 K/uL — ABNORMAL HIGH (ref 150–400)
RBC: 4.05 MIL/uL (ref 3.87–5.11)
RDW: 13.4 % (ref 11.5–15.5)
WBC Count: 8.4 K/uL (ref 4.0–10.5)
nRBC: 0 % (ref 0.0–0.2)

## 2024-03-22 LAB — CMP (CANCER CENTER ONLY)
ALT: 14 U/L (ref 0–44)
AST: 18 U/L (ref 15–41)
Albumin: 3.4 g/dL — ABNORMAL LOW (ref 3.5–5.0)
Alkaline Phosphatase: 70 U/L (ref 38–126)
Anion gap: 7 (ref 5–15)
BUN: 18 mg/dL (ref 8–23)
CO2: 25 mmol/L (ref 22–32)
Calcium: 9.1 mg/dL (ref 8.9–10.3)
Chloride: 105 mmol/L (ref 98–111)
Creatinine: 0.5 mg/dL (ref 0.44–1.00)
GFR, Estimated: >60 mL/min (ref >60)
Glucose, Bld: 98 mg/dL (ref 70–99)
Potassium: 4.1 mmol/L (ref 3.5–5.1)
Sodium: 137 mmol/L (ref 135–145)
Total Bilirubin: 0.5 mg/dL (ref 0.0–1.2)
Total Protein: 7.2 g/dL (ref 6.5–8.1)

## 2024-03-22 LAB — RAD ONC ARIA SESSION SUMMARY
Course Elapsed Days: 6
Plan Fractions Treated to Date: 5
Plan Prescribed Dose Per Fraction: 2 Gy
Plan Total Fractions Prescribed: 33
Plan Total Prescribed Dose: 66 Gy
Reference Point Dosage Given to Date: 10 Gy
Reference Point Session Dosage Given: 2 Gy
Session Number: 5

## 2024-03-22 MED ORDER — SODIUM CHLORIDE 0.9 % IV SOLN
45.0000 mg/m2 | Freq: Once | INTRAVENOUS | Status: AC
  Administered 2024-03-22: 54 mg via INTRAVENOUS
  Filled 2024-03-22: qty 9

## 2024-03-22 MED ORDER — SODIUM CHLORIDE 0.9 % IV SOLN
108.6000 mg | Freq: Once | INTRAVENOUS | Status: AC
  Administered 2024-03-22: 110 mg via INTRAVENOUS
  Filled 2024-03-22: qty 11

## 2024-03-22 MED ORDER — SODIUM CHLORIDE 0.9 % IV SOLN
INTRAVENOUS | Status: DC
  Filled 2024-03-22: qty 250

## 2024-03-22 MED ORDER — DIPHENHYDRAMINE HCL 50 MG/ML IJ SOLN
50.0000 mg | Freq: Once | INTRAMUSCULAR | Status: AC
  Administered 2024-03-22: 50 mg via INTRAVENOUS
  Filled 2024-03-22: qty 1

## 2024-03-22 MED ORDER — PALONOSETRON HCL INJECTION 0.25 MG/5ML
0.2500 mg | Freq: Once | INTRAVENOUS | Status: AC
  Administered 2024-03-22: 0.25 mg via INTRAVENOUS
  Filled 2024-03-22: qty 5

## 2024-03-22 MED ORDER — DEXAMETHASONE SODIUM PHOSPHATE 10 MG/ML IJ SOLN
10.0000 mg | Freq: Once | INTRAMUSCULAR | Status: AC
  Administered 2024-03-22: 10 mg via INTRAVENOUS
  Filled 2024-03-22: qty 1

## 2024-03-22 MED ORDER — FAMOTIDINE IN NACL 20-0.9 MG/50ML-% IV SOLN
20.0000 mg | Freq: Once | INTRAVENOUS | Status: AC
  Administered 2024-03-22: 20 mg via INTRAVENOUS
  Filled 2024-03-22: qty 50

## 2024-03-22 NOTE — Progress Notes (Signed)
 C/o that chemo is causing slight constipation. Not taking anything, unsure if she should use her stool softener.

## 2024-03-22 NOTE — Assessment & Plan Note (Signed)
#   AUG 2025-[ [facility]] Dr. Tamea. POSITIVE FOR MALIGNANCY- SQUAMOUS CELL CARCINOMA- CT RIGHT lower lobe, 6.4 cm greatest   PET scan:  Hypermetabolic right lower lobe mass and probable right hilar adenopathy, findings consistent with T2 be N1 M0 or stage II B primary bronchogenic carcinoma.  AUG 2025-  brain MRI-NEG. Treatment: Definitive weekly carbotaxol with the daily radiation. [Start- 03/15/24]  # Proceed with cycle #2 of planned- 6-7 of CarboTaxol weekly. Labs-CBC/chemistries were reviewed with the patient.  # constipation- G-1 . Recommend stool softener.  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   # Bone health- recommend BMD; but declines- recommend calcium 1200 mg/day; along with high dose vit D.   # active smoker: < 1ppd; recommend quitting-   # IV ACCESS: s/p port placement.   weekly-  labs/port- MD  chemo- x3 at  time- PS-  # DISPOSITION: # cancel dec appts- # chemo today-  # as per IS- weekly-  labs/port- MD  chemo--Dr.B

## 2024-03-22 NOTE — Progress Notes (Signed)
 [name] CONSULT NOTE  Patient Care Team: Odell Chard, Edra GRADE, MD as PCP - General (Family Medicine) Dellie Louanne MATSU, MD (General Surgery) Cindie Jesusa HERO, RN as Registered Nurse Georgina Shasta POUR, RN as Oncology Nurse Navigator Rennie Cindy SAUNDERS, MD as Consulting Physician (Oncology) Verdene Gills, RN as Oncology Nurse Navigator  CHIEF COMPLAINTS/PURPOSE OF CONSULTATION: Lung cancer/breast cancer  #  Oncology History Overview Note  BRCA negative 2013    Breast cancer [facility]) 2005    left breast ca   Breast cancer [facility]) 2006, 1999    right breast ca   Cancer [facility]) [phone]   # Left breast- 1999 mild ducts- s/p lumpectomy; s/p RT- no chemo [Dr.Sankar/Dr.Byrnett]  # 2006 ? Stage- s/p right mastectomy s/p chemo [Dr.Choksi]; Dr.Chrystal     DIAGNOSIS: A.  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 Size of invasive carcinoma: 2 mm Mm in this sample Histologic grade of invasive carcinoma: Grade 2                      Glandular/tubular differentiation score: 3                      Nuclear pleomorphism score: 3                      Mitotic rate score: 1                      Total score: 7 Ductal carcinoma in situ: Present, high-grade comedo type with associated calcifications Lymphovascular invasion: Not identified  Comment: The definitive grade will be assigned on the excisional specimen. ER/PR/HER2: Immunohistochemistry will be performed on block A2, with reflex to FISH for HER2 2+. The results will be reported in an addendum.  GROSS DESCRIPTION: A. Labeled: Left breast stereo biopsy calcs upper outer quadrant mid depth Received: in a formalin-filled Bre[place]a collection device Specimen radiograph image(s) available for review Time/Date in fixative: Collected at 10:58 AM on 10/23/2022 and placed in formalin at 11:01 AM on 10/23/2022 Cold ischemic time: Less  than 5 minutes Total fixation time: Approximately 9.25 hours Core pieces: Multiple Measurement: Aggregate, 8.5 x 1.5 x 0.3 cm Description / comments: Received are cores and fragments of yellow fibrofatty tissue.  The accompanying diagram has sections B and G checked. Inked: Green Entirely submitted in cassette(s):  1 - section B 2 - section G 3 - 7 - remaining tissue fragments      3 - sections A and C      4 - sections D and E      5 - sections F and H      6 - sections I and J      7 - sections K and L with remaining free-floating fragments  RB 10/23/2022  Final Diagnosis performed by Rexene Daily, MD.   Electronically signed 10/24/2022 10:40:25AM The electronic signature indicates that the named Attending Pathologist has evaluated the specimen Technical component performed at [facility], [address], [place], [place] [address] Lab: [phone] Dir: Frankey Sas, MD, MMM  Professional component performed at [facility], [facility], [address] [place], [place], [place] [address] Lab: [phone] Dir: Wilkie EMERSON Molt, MD  ADDENDUM: CASE SUMMARY: BREAST BIOMARKER TESTS Estrogen Receptor (ER) Status: POSITIVE         Percentage of cells with nuclear positivity: Greater than 90%  Average intensity of staining: Strong  Progesterone Receptor (PgR) Status: NEGATIVE (LESS THAN 1%)         Internal control cells present and stain as expected  HER2 (by immunohistochemistry): NEGATIVE (Score 1+)  Histologic Type: Invasive carcinoma of no special type (ductal) Histologic Grade (Nottingham Histologic Score)      Glandular (Acinar)/Tubular Differentiation: 3      Nuclear Pleomorphism: 3      Mitotic Rate: 3      Overall Grade: Grade 3 Tumor Size: At least 17 mm Tumor Focality: Single focus of invasive carcinoma Ductal Carcinoma In Situ (DCIS): Present      Positive for extensive intraductal component (EIC) Tumor Extent: Not applicable Lymphatic and/or  Vascular Invasion: Not identified  Treatment Effect in the Breast: No known presurgical therapy  MARGINS Margin Status for Invasive Carcinoma: All margins negative for invasive carcinoma   APRIL/MAY 2024-left breast invasive mammary carcinoma s/p mastectomy [Dr. Rodenberg]-at least pT1c [17 mm]; 2 sentinel lymph nodes negative; grade 3; Oncotype 35 high risk-declined chemotherapy.   # # AUG 2025-[ LCSP] Dr. Tamea. POSITIVE FOR MALIGNANCY- SQUAMOUS CELL CARCINOMA- CT RIGHT lower lobe, 6.4 cm greatest   PET scan:  Hypermetabolic right lower lobe mass and probable right hilar adenopathy, findings consistent with T2 be N1 M0 or stage II B primary bronchogenic carcinoma.  AUG brain MRI-NEG.   # Treatment: Definitive weekly carbotaxol with the daily radiation. [Start- 03/15/24]       Carcinoma of upper-outer quadrant of left breast in female, estrogen receptor positive (HCC)  10/30/2022 Initial Diagnosis   Carcinoma of upper-outer quadrant of left breast in female, estrogen receptor positive   10/30/2022 Cancer Staging   Staging form: Breast, AJCC 8th Edition - Clinical: Stage IA (cT1a, cN0, cM0, G2, ER+, PR-, HER2-) - Signed by Rennie Cindy SAUNDERS, MD on 10/30/2022 Histologic grading system: 3 grade system   12/05/2022 - 12/05/2022 Chemotherapy   Patient is on Treatment Plan : BREAST TC q21d     Primary cancer of right lower lobe of lung (HCC)  02/23/2024 Initial Diagnosis   Primary cancer of right lower lobe of lung (HCC)   02/23/2024 Cancer Staging   Staging form: Lung, AJCC V9 - Clinical: Stage IIB (cT2b, cN1, cM0) - Signed by Rennie Cindy SAUNDERS, MD on 02/23/2024   03/15/2024 -  Chemotherapy   Patient is on Treatment Plan : LUNG Carboplatin  + Paclitaxel  + XRT q7d      HISTORY OF PRESENTING ILLNESS: Patient ambulating-independently.  Alone.   Gabriella Green 73 y.o.  female patient  with history of breast cancer stage I on anastrozole  and right lower lobe lung cancer stage IIb  -currently on definitive chemoradiation is here for follow-up.  Patient notes to have chemo is causing slight constipation. Not taking anything, unsure if she should use her stool softener.  Denies any worsening shortness of breath or cough.  Appetite fair.  No weight loss.  Review of Systems  Constitutional:  Negative for chills, diaphoresis, fever, malaise/fatigue and weight loss.  HENT:  Negative for nosebleeds and sore throat.   Eyes:  Negative for double vision.  Respiratory:  Positive for cough. Negative for hemoptysis, sputum production, shortness of breath and wheezing.   Cardiovascular:  Negative for chest pain, palpitations, orthopnea and leg swelling.  Gastrointestinal:  Negative for abdominal pain, blood in stool, constipation, diarrhea, heartburn, melena, nausea and vomiting.  Genitourinary:  Negative for dysuria, frequency and urgency.  Musculoskeletal:  Positive for back pain and joint  pain.  Skin: Negative.  Negative for itching and rash.  Neurological:  Negative for dizziness, tingling, focal weakness, weakness and headaches.  Endo/Heme/Allergies:  Does not bruise/bleed easily.  Psychiatric/Behavioral:  Negative for depression. The patient is not nervous/anxious and does not have insomnia.      MEDICAL HISTORY:  Past Medical History:  Diagnosis Date   BRCA negative 2013   Breast cancer (HCC) 2005   left breast ca   Breast cancer (HCC) 2006, 1999   right breast ca   COPD (chronic obstructive pulmonary disease) (HCC)    Mass of right lung    Multiple lung nodules    Personal history of chemotherapy    2006 rt breast ca   Personal history of radiation therapy    PONV (postoperative nausea and vomiting)    RBBB (right bundle branch block)     SURGICAL HISTORY: Past Surgical History:  Procedure Laterality Date   BREAST BIOPSY Left 2019    ADENOSIS, SKELETAL MUSCLE of LEFT breast   BREAST BIOPSY Left 10/23/2022   Left Breast Stereo Bx, X clip - path pending    BREAST BIOPSY Left 10/23/2022   MM LT BREAST BX W LOC DEV 1ST LESION IMAGE BX SPEC STEREO GUIDE 10/23/2022 [facility]   BREAST CYST ASPIRATION Bilateral    BREAST EXCISIONAL BIOPSY Right 1999   rad breast ca   BREAST EXCISIONAL BIOPSY Left 2005   rad breast ca   BREAST EXCISIONAL BIOPSY Right 2006   mastectomy chemo    BREAST LUMPECTOMY Left 2005   1999 right lumpectomy   CHOLECYSTECTOMY  2002   COLONOSCOPY  2009   COLONOSCOPY WITH PROPOFOL  N/A 09/03/2018   Procedure: COLONOSCOPY WITH PROPOFOL ;  Surgeon: Unk Corinn Skiff, MD;  Location: [facility];  Service: Gastroenterology;  Laterality: N/A;   ENDOBRONCHIAL ULTRASOUND Bilateral 02/16/2024   Procedure: ENDOBRONCHIAL ULTRASOUND (EBUS);  Surgeon: Tamea Dedra CROME, MD;  Location: [facility];  Service: Pulmonary;  Laterality: Bilateral;   ESOPHAGOGASTRODUODENOSCOPY (EGD) WITH PROPOFOL  N/A 09/03/2018   Procedure: ESOPHAGOGASTRODUODENOSCOPY (EGD) WITH PROPOFOL ;  Surgeon: Unk Corinn Skiff, MD;  Location: [facility];  Service: Gastroenterology;  Laterality: N/A;   IR IMAGING GUIDED PORT INSERTION  03/04/2024   MASTECTOMY Right 2006   MASTECTOMY W/ SENTINEL NODE BIOPSY Left 11/13/2022   Procedure: MASTECTOMY WITH SENTINEL LYMPH NODE BIOPSY, total mastectomy;  Surgeon: Lane Shope, MD;  Location: [facility];  Service: General;  Laterality: Left;   VIDEO BRONCHOSCOPY WITH ENDOBRONCHIAL NAVIGATION Bilateral 02/16/2024   Procedure: VIDEO BRONCHOSCOPY WITH ENDOBRONCHIAL NAVIGATION;  Surgeon: Tamea Dedra CROME, MD;  Location: [facility];  Service: Pulmonary;  Laterality: Bilateral;    SOCIAL HISTORY: Social History   Socioeconomic History   Marital status: Widowed    Spouse name: Not on file   Number of children: Not on file   Years of education: Not on file   Highest education level: Not on file  Occupational History   Not on file  Tobacco Use   Smoking status: Every Day    Current packs/day: 0.50    Average packs/day: 1  pack/day for 45.7 years (45.6 ttl pk-yrs)    Types: Cigarettes    Start date: 1980    Passive exposure: Past   Smokeless tobacco: Never  Vaping Use   Vaping status: Never Used  Substance and Sexual Activity   Alcohol use: No   Drug use: No   Sexual activity: Not on file  Other Topics Concern   Not on file  Social History Narrative  Not on file   Social Drivers of Health   Financial Resource Strain: Not on file  Food Insecurity: No Food Insecurity (10/30/2022)   Hunger Vital Sign    Worried About Running Out of Food in the Last Year: Never true    Ran Out of Food in the Last Year: Never true  Transportation Needs: No Transportation Needs (10/30/2022)   PRAPARE - Administrator, Civil Service (Medical): No    Lack of Transportation (Non-Medical): No  Physical Activity: Not on file  Stress: Not on file  Social Connections: Not on file  Intimate Partner Violence: Not on file    FAMILY HISTORY: Family History  Problem Relation Age of Onset   Breast cancer Mother 11   Breast cancer Sister 55   Breast cancer Maternal Aunt 40    ALLERGIES:  is allergic to codeine and tape.  MEDICATIONS:  Current Outpatient Medications  Medication Sig Dispense Refill   anastrozole  (ARIMIDEX ) 1 MG tablet TAKE 1 TABLET(1 MG) BY MOUTH DAILY (Patient taking differently: Take 1 mg by mouth at bedtime.) 90 tablet 1   aspirin EC 81 MG tablet Take 81 mg by mouth at bedtime. Swallow whole.     lidocaine -prilocaine  (EMLA ) cream Apply to affected area once 30 g 3   omeprazole  (PRILOSEC) 40 MG capsule Take 1 capsule (40 mg total) by mouth daily. 30 capsule 2   ondansetron  (ZOFRAN ) 8 MG tablet Take 1 tablet (8 mg total) by mouth every 8 (eight) hours as needed for nausea or vomiting. Start on the third day after chemotherapy. 30 tablet 1   ondansetron  (ZOFRAN -ODT) 4 MG disintegrating tablet Take 4 mg by mouth every 8 (eight) hours as needed.     prochlorperazine  (COMPAZINE ) 10 MG tablet Take 1  tablet (10 mg total) by mouth every 6 (six) hours as needed for nausea or vomiting. 30 tablet 1   No current facility-administered medications for this visit.   Facility-Administered Medications Ordered in Other Visits  Medication Dose Route Frequency Provider Last Rate Last Admin   0.9 %  sodium chloride  infusion   Intravenous Continuous Shaylan Tutton R, MD 10 mL/hr at 03/22/24 0958 New Bag at 03/22/24 0958   CARBOplatin  (PARAPLATIN ) 110 mg in sodium chloride  0.9 % 100 mL chemo infusion  110 mg Intravenous Once Perris Conwell R, MD       famotidine  (PEPCID ) IVPB 20 mg premix  20 mg Intravenous Once Maudell Stanbrough R, MD 200 mL/hr at 03/22/24 1033 20 mg at 03/22/24 1033   PACLitaxel  (TAXOL ) 54 mg in sodium chloride  0.9 % 150 mL chemo infusion (</= 80mg /m2)  45 mg/m2 (Treatment Plan Recorded) Intravenous Once Talasia Saulter R, MD         PHYSICAL EXAMINATION:   Vitals:   03/22/24 0858  BP: 123/71  Pulse: 78  Resp: 20  Temp: 98.5 F (36.9 C)  SpO2: 100%       Filed Weights   03/22/24 0858  Weight: 82 lb 11.2 oz (37.5 kg)     Physical Exam Vitals and nursing note reviewed.  HENT:     Head: Normocephalic and atraumatic.     Mouth/Throat:     Pharynx: Oropharynx is clear.  Eyes:     Extraocular Movements: Extraocular movements intact.     Pupils: Pupils are equal, round, and reactive to light.  Cardiovascular:     Rate and Rhythm: Normal rate and regular rhythm.  Pulmonary:     Comments: Decreased breath sounds bilaterally.  Abdominal:     Palpations: Abdomen is soft.  Musculoskeletal:        General: Normal range of motion.     Cervical back: Normal range of motion.  Skin:    General: Skin is warm.  Neurological:     General: No focal deficit present.     Mental Status: She is alert and oriented to person, place, and time.  Psychiatric:        Behavior: Behavior normal.        Judgment: Judgment normal.      LABORATORY DATA:  I have  reviewed the data as listed Lab Results  Component Value Date   WBC 8.4 03/22/2024   HGB 12.2 03/22/2024   HCT 37.3 03/22/2024   MCV 92.1 03/22/2024   PLT 427 (H) 03/22/2024   Recent Labs    12/08/23 1503 03/15/24 0816 03/22/24 0836  NA 138 137 137  K 3.8 4.0 4.1  CL 102 103 105  CO2 28 24 25   GLUCOSE 130* 99 98  BUN 15 20 18   CREATININE 0.76 0.46 0.50  CALCIUM 9.3 9.3 9.1  GFRNONAA >60 >60 >60  PROT 7.7 7.5 7.2  ALBUMIN 3.7 3.5 3.4*  AST 17 20 18   ALT 12 13 14   ALKPHOS 78 78 70  BILITOT 0.4 0.4 0.5     RADIOGRAPHIC STUDIES: I have personally reviewed the radiological images as listed and agreed with the findings in the report. MR Brain W Wo Contrast Result Date: 03/08/2024 EXAM: MRI BRAIN WITH AND WITHOUT CONTRAST 02/26/2024 04:28:48 PM TECHNIQUE: Multiplanar multisequence MRI of the head/brain was performed with and without the administration of intravenous contrast. COMPARISON: CT of the head dated 07/18/2021. CLINICAL HISTORY: Metastatic disease evaluation. Lung ca staging. Hx breast ca. NKI No surgery No symptoms per patient. FINDINGS: BRAIN AND VENTRICLES: No acute infarct. No acute intracranial hemorrhage. No mass effect or midline shift. No hydrocephalus. The sella is unremarkable. Normal flow voids. No mass or abnormal enhancement. Moderate diffuse cerebral white matter disease present. ORBITS: No acute abnormality. SINUSES: No acute abnormality. BONES AND SOFT TISSUES: Normal bone marrow signal and enhancement. No acute soft tissue abnormality. IMPRESSION: 1. No acute intracranial abnormality. No findings concerning for metastatic disease. 2. Moderate diffuse cerebral white matter disease. Electronically signed by: Evalene Coho MD 03/08/2024 02:42 PM EDT RP Workstation: [id number]   IR IMAGING GUIDED PORT INSERTION Result Date: 03/04/2024 CLINICAL DATA:  Squamous lung carcinoma of the right lower lobe and need for porta cath for chemotherapy. EXAM: IMPLANTED PORT A  CATH PLACEMENT WITH ULTRASOUND AND FLUOROSCOPIC GUIDANCE ANESTHESIA/SEDATION: Moderate (conscious) sedation was employed during this procedure. A total of Versed  2.0 mg and Fentanyl  100 mcg was administered intravenously. Moderate Sedation Time: 22 minutes. The patient's level of consciousness and vital signs were monitored continuously by radiology nursing throughout the procedure under my direct supervision. FLUOROSCOPY: Radiation Exposure Index: 1.0 mGy Kerma PROCEDURE: The procedure, risks, benefits, and alternatives were explained to the patient. Questions regarding the procedure were encouraged and answered. The patient understands and consents to the procedure. A time-out was performed prior to initiating the procedure. Ultrasound was utilized to confirm patency of the left internal jugular vein. An ultrasound image was saved and recorded. The left neck and chest were prepped with chlorhexidine  in a sterile fashion, and a sterile drape was applied covering the operative field. Maximum barrier sterile technique with sterile gowns and gloves were used for the procedure. Local anesthesia was provided with 1% lidocaine . After creating  a small venotomy incision, a 21 gauge needle was advanced into the left internal jugular vein under direct, real-time ultrasound guidance. Ultrasound image documentation was performed. After securing guidewire access, an 8 Fr dilator was placed. A J-wire was kinked to measure appropriate catheter length. A subcutaneous port pocket was then created along the upper chest wall utilizing sharp and blunt dissection. Portable cautery was utilized. The pocket was irrigated with sterile saline. A single lumen power injectable port was chosen for placement. The 8 Fr catheter was tunneled from the port pocket site to the venotomy incision. The port was placed in the pocket. External catheter was trimmed to appropriate length based on guidewire measurement. At the venotomy, an 8 Fr peel-away  sheath was placed over a guidewire. The catheter was then placed through the sheath and the sheath removed. Final catheter positioning was confirmed and documented with a fluoroscopic spot image. The port was accessed with a needle and aspirated and flushed with heparinized saline. The access needle was removed. The venotomy and port pocket incisions were closed with subcutaneous 3-0 Monocryl and subcuticular 4-0 Vicryl. Dermabond was applied to both incisions. COMPLICATIONS: COMPLICATIONS None FINDINGS: After catheter placement, the tip lies at the cavo-atrial junction. The catheter aspirates normally and is ready for immediate use. IMPRESSION: Placement of single lumen port a cath via left internal jugular vein. The catheter tip lies at the cavo-atrial junction. A power injectable port a cath was placed and is ready for immediate use. Electronically Signed   By: Marcey Moan M.D.   On: 03/04/2024 14:45    ASSESSMENT & PLAN:   Primary cancer of right lower lobe of lung (HCC) # AUG 2025-[ [facility]SP] Dr. Tamea. POSITIVE FOR MALIGNANCY- SQUAMOUS CELL CARCINOMA- CT RIGHT lower lobe, 6.4 cm greatest   PET scan:  Hypermetabolic right lower lobe mass and probable right hilar adenopathy, findings consistent with T2 be N1 M0 or stage II B primary bronchogenic carcinoma.  AUG 2025-  brain MRI-NEG. Treatment: Definitive weekly carbotaxol with the daily radiation. [Start- 03/15/24]  # Proceed with cycle #2 of planned- 6-7 of CarboTaxol weekly. Labs-CBC/chemistries were reviewed with the patient.  # constipation- G-1 . Recommend stool softener.  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   # Bone health- recommend BMD; but declines- recommend calcium 1200 mg/day; along with high dose vit D.   # active smoker: < 1ppd; recommend quitting-   # IV ACCESS: s/p port  placement.   weekly-  labs/port- MD  chemo- x3 at  time- PS-  # DISPOSITION: # cancel dec appts- # chemo today-  # as per IS- weekly-  labs/port- MD  chemo--Dr.B    All questions were answered. The patient/family knows to call the clinic with any problems, questions or concerns.    Cindy JONELLE Joe, MD 03/22/2024 10:40 AM

## 2024-03-23 ENCOUNTER — Ambulatory Visit
Admission: RE | Admit: 2024-03-23 | Discharge: 2024-03-23 | Disposition: A | Discharge status: Home / Self Care | Source: Ambulatory Visit | Attending: Radiation Oncology | Admitting: Radiation Oncology

## 2024-03-23 ENCOUNTER — Other Ambulatory Visit: Payer: Self-pay

## 2024-03-23 DIAGNOSIS — C3431 Malignant neoplasm of lower lobe, right bronchus or lung: Secondary | ICD-10-CM | POA: Diagnosis not present

## 2024-03-23 LAB — RAD ONC ARIA SESSION SUMMARY
Course Elapsed Days: 7
Plan Fractions Treated to Date: 6
Plan Prescribed Dose Per Fraction: 2 Gy
Plan Total Fractions Prescribed: 33
Plan Total Prescribed Dose: 66 Gy
Reference Point Dosage Given to Date: 12 Gy
Reference Point Session Dosage Given: 2 Gy
Session Number: 6

## 2024-03-23 NOTE — Addendum Note (Signed)
 Encounter addended by: Janice Lynwood BROCKS on: 03/23/2024 11:16 AM  Actions taken: Imaging Exam ended

## 2024-03-24 ENCOUNTER — Ambulatory Visit
Admission: RE | Admit: 2024-03-24 | Discharge: 2024-03-24 | Disposition: A | Discharge status: Home / Self Care | Source: Ambulatory Visit | Attending: Radiation Oncology | Admitting: Radiation Oncology

## 2024-03-24 ENCOUNTER — Other Ambulatory Visit: Payer: Self-pay

## 2024-03-24 DIAGNOSIS — C3431 Malignant neoplasm of lower lobe, right bronchus or lung: Secondary | ICD-10-CM | POA: Diagnosis not present

## 2024-03-24 LAB — RAD ONC ARIA SESSION SUMMARY
Course Elapsed Days: 8
Plan Fractions Treated to Date: 7
Plan Prescribed Dose Per Fraction: 2 Gy
Plan Total Fractions Prescribed: 33
Plan Total Prescribed Dose: 66 Gy
Reference Point Dosage Given to Date: 14 Gy
Reference Point Session Dosage Given: 2 Gy
Session Number: 7

## 2024-03-25 ENCOUNTER — Other Ambulatory Visit: Payer: Self-pay

## 2024-03-25 ENCOUNTER — Ambulatory Visit
Admission: RE | Admit: 2024-03-25 | Discharge: 2024-03-25 | Disposition: A | Discharge status: Home / Self Care | Source: Ambulatory Visit | Attending: Radiation Oncology | Admitting: Radiation Oncology

## 2024-03-25 DIAGNOSIS — R0602 Shortness of breath: Secondary | ICD-10-CM

## 2024-03-25 DIAGNOSIS — C3431 Malignant neoplasm of lower lobe, right bronchus or lung: Secondary | ICD-10-CM | POA: Diagnosis not present

## 2024-03-25 LAB — RAD ONC ARIA SESSION SUMMARY
Course Elapsed Days: 9
Plan Fractions Treated to Date: 8
Plan Prescribed Dose Per Fraction: 2 Gy
Plan Total Fractions Prescribed: 33
Plan Total Prescribed Dose: 66 Gy
Reference Point Dosage Given to Date: 16 Gy
Reference Point Session Dosage Given: 2 Gy
Session Number: 8

## 2024-03-26 ENCOUNTER — Ambulatory Visit
Admission: RE | Admit: 2024-03-26 | Discharge: 2024-03-26 | Disposition: A | Discharge status: Home / Self Care | Source: Ambulatory Visit | Attending: Radiation Oncology | Admitting: Radiation Oncology

## 2024-03-26 ENCOUNTER — Ambulatory Visit (INDEPENDENT_AMBULATORY_CARE_PROVIDER_SITE_OTHER): Admitting: Pulmonary Disease

## 2024-03-26 ENCOUNTER — Other Ambulatory Visit: Payer: Self-pay

## 2024-03-26 DIAGNOSIS — J449 Chronic obstructive pulmonary disease, unspecified: Secondary | ICD-10-CM

## 2024-03-26 DIAGNOSIS — C3431 Malignant neoplasm of lower lobe, right bronchus or lung: Secondary | ICD-10-CM | POA: Diagnosis not present

## 2024-03-26 LAB — PULMONARY FUNCTION TEST
DL/VA % pred: 78 %
DL/VA: 3.33 ml/min/mmHg/L
DLCO cor % pred: 68 %
DLCO cor: 11.79 ml/min/mmHg
DLCO unc % pred: 66 %
DLCO unc: 11.33 ml/min/mmHg
FEF 25-75 Post: 0.8 L/s
FEF 25-75 Pre: 1.25 L/s
FEF2575-%Change-Post: -36 %
FEF2575-%Pred-Post: 50 %
FEF2575-%Pred-Pre: 78 %
FEV1-%Change-Post: -4 %
FEV1-%Pred-Post: 81 %
FEV1-%Pred-Pre: 85 %
FEV1-Post: 1.54 L
FEV1-Pre: 1.62 L
FEV1FVC-%Change-Post: 3 %
FEV1FVC-%Pred-Pre: 99 %
FEV6-%Change-Post: -6 %
FEV6-%Pred-Post: 82 %
FEV6-%Pred-Pre: 88 %
FEV6-Post: 1.98 L
FEV6-Pre: 2.12 L
FEV6FVC-%Pred-Post: 105 %
FEV6FVC-%Pred-Pre: 105 %
FVC-%Change-Post: -8 %
FVC-%Pred-Post: 78 %
FVC-%Pred-Pre: 84 %
FVC-Post: 1.98 L
FVC-Pre: 2.15 L
Post FEV1/FVC ratio: 78 %
Post FEV6/FVC ratio: 100 %
Pre FEV1/FVC ratio: 75 %
Pre FEV6/FVC Ratio: 100 %
RV % pred: 97 %
RV: 2.04 L
TLC % pred: 89 %
TLC: 4.12 L

## 2024-03-26 LAB — RAD ONC ARIA SESSION SUMMARY
Course Elapsed Days: 10
Plan Fractions Treated to Date: 9
Plan Prescribed Dose Per Fraction: 2 Gy
Plan Total Fractions Prescribed: 33
Plan Total Prescribed Dose: 66 Gy
Reference Point Dosage Given to Date: 18 Gy
Reference Point Session Dosage Given: 2 Gy
Session Number: 9

## 2024-03-26 NOTE — Patient Instructions (Signed)
 Full PFT completed today ? ?

## 2024-03-26 NOTE — Progress Notes (Signed)
 Full PFT completed today ? ?

## 2024-03-29 ENCOUNTER — Other Ambulatory Visit: Payer: Self-pay

## 2024-03-29 ENCOUNTER — Ambulatory Visit
Admission: RE | Admit: 2024-03-29 | Discharge: 2024-03-29 | Disposition: A | Discharge status: Home / Self Care | Source: Ambulatory Visit | Attending: Radiation Oncology | Admitting: Radiation Oncology

## 2024-03-29 ENCOUNTER — Encounter: Payer: Self-pay | Admitting: *Deleted

## 2024-03-29 ENCOUNTER — Ambulatory Visit: Payer: Self-pay | Admitting: Pulmonary Disease

## 2024-03-29 ENCOUNTER — Other Ambulatory Visit

## 2024-03-29 ENCOUNTER — Inpatient Hospital Stay (HOSPITAL_BASED_OUTPATIENT_CLINIC_OR_DEPARTMENT_OTHER): Admitting: Internal Medicine

## 2024-03-29 ENCOUNTER — Inpatient Hospital Stay

## 2024-03-29 ENCOUNTER — Encounter: Payer: Self-pay | Admitting: Internal Medicine

## 2024-03-29 VITALS — BP 138/61 | HR 82 | Temp 97.6°F | Resp 20 | Ht 61.0 in | Wt 82.0 lb

## 2024-03-29 VITALS — BP 108/65 | HR 87

## 2024-03-29 DIAGNOSIS — C3431 Malignant neoplasm of lower lobe, right bronchus or lung: Secondary | ICD-10-CM

## 2024-03-29 DIAGNOSIS — Z5111 Encounter for antineoplastic chemotherapy: Secondary | ICD-10-CM | POA: Diagnosis not present

## 2024-03-29 LAB — CMP (CANCER CENTER ONLY)
ALT: 12 U/L (ref 0–44)
AST: 16 U/L (ref 15–41)
Albumin: 3.3 g/dL — ABNORMAL LOW (ref 3.5–5.0)
Alkaline Phosphatase: 71 U/L (ref 38–126)
Anion gap: 7 (ref 5–15)
BUN: 13 mg/dL (ref 8–23)
CO2: 23 mmol/L (ref 22–32)
Calcium: 8.8 mg/dL — ABNORMAL LOW (ref 8.9–10.3)
Chloride: 104 mmol/L (ref 98–111)
Creatinine: 0.48 mg/dL (ref 0.44–1.00)
GFR, Estimated: >60 mL/min (ref >60)
Glucose, Bld: 103 mg/dL — ABNORMAL HIGH (ref 70–99)
Potassium: 4 mmol/L (ref 3.5–5.1)
Sodium: 134 mmol/L — ABNORMAL LOW (ref 135–145)
Total Bilirubin: 0.4 mg/dL (ref 0.0–1.2)
Total Protein: 7.2 g/dL (ref 6.5–8.1)

## 2024-03-29 LAB — CBC WITH DIFFERENTIAL (CANCER CENTER ONLY)
Abs Immature Granulocytes: 0.05 K/uL (ref 0.00–0.07)
Basophils Absolute: 0.1 K/uL (ref 0.0–0.1)
Basophils Relative: 1 %
Eosinophils Absolute: 0.1 K/uL (ref 0.0–0.5)
Eosinophils Relative: 2 %
HCT: 34.9 % — ABNORMAL LOW (ref 36.0–46.0)
Hemoglobin: 11.7 g/dL — ABNORMAL LOW (ref 12.0–15.0)
Immature Granulocytes: 1 %
Lymphocytes Relative: 9 %
Lymphs Abs: 0.7 K/uL (ref 0.7–4.0)
MCH: 30.7 pg (ref 26.0–34.0)
MCHC: 33.5 g/dL (ref 30.0–36.0)
MCV: 91.6 fL (ref 80.0–100.0)
Monocytes Absolute: 0.8 K/uL (ref 0.1–1.0)
Monocytes Relative: 10 %
Neutro Abs: 6 K/uL (ref 1.7–7.7)
Neutrophils Relative %: 77 %
Platelet Count: 392 K/uL (ref 150–400)
RBC: 3.81 MIL/uL — ABNORMAL LOW (ref 3.87–5.11)
RDW: 13.7 % (ref 11.5–15.5)
WBC Count: 7.7 K/uL (ref 4.0–10.5)
nRBC: 0 % (ref 0.0–0.2)

## 2024-03-29 LAB — RAD ONC ARIA SESSION SUMMARY
Course Elapsed Days: 13
Plan Fractions Treated to Date: 10
Plan Prescribed Dose Per Fraction: 2 Gy
Plan Total Fractions Prescribed: 33
Plan Total Prescribed Dose: 66 Gy
Reference Point Dosage Given to Date: 20 Gy
Reference Point Session Dosage Given: 2 Gy
Session Number: 10

## 2024-03-29 MED ORDER — DIPHENHYDRAMINE HCL 50 MG/ML IJ SOLN
50.0000 mg | Freq: Once | INTRAMUSCULAR | Status: AC
  Administered 2024-03-29: 50 mg via INTRAVENOUS
  Filled 2024-03-29: qty 1

## 2024-03-29 MED ORDER — SODIUM CHLORIDE 0.9 % IV SOLN
108.6000 mg | Freq: Once | INTRAVENOUS | Status: AC
  Administered 2024-03-29: 110 mg via INTRAVENOUS
  Filled 2024-03-29: qty 11

## 2024-03-29 MED ORDER — DEXAMETHASONE SODIUM PHOSPHATE 10 MG/ML IJ SOLN
4.0000 mg | Freq: Once | INTRAMUSCULAR | Status: AC
  Administered 2024-03-29: 4 mg via INTRAVENOUS
  Filled 2024-03-29: qty 1

## 2024-03-29 MED ORDER — FAMOTIDINE IN NACL 20-0.9 MG/50ML-% IV SOLN
20.0000 mg | Freq: Once | INTRAVENOUS | Status: AC
  Administered 2024-03-29: 20 mg via INTRAVENOUS
  Filled 2024-03-29: qty 50

## 2024-03-29 MED ORDER — SODIUM CHLORIDE 0.9 % IV SOLN
45.0000 mg/m2 | Freq: Once | INTRAVENOUS | Status: AC
  Administered 2024-03-29: 54 mg via INTRAVENOUS
  Filled 2024-03-29: qty 9

## 2024-03-29 MED ORDER — SODIUM CHLORIDE 0.9 % IV SOLN
INTRAVENOUS | Status: DC
  Filled 2024-03-29: qty 250

## 2024-03-29 MED ORDER — PALONOSETRON HCL INJECTION 0.25 MG/5ML
0.2500 mg | Freq: Once | INTRAVENOUS | Status: AC
  Administered 2024-03-29: 0.25 mg via INTRAVENOUS
  Filled 2024-03-29: qty 5

## 2024-03-29 NOTE — Patient Instructions (Signed)
#  re: Constipation recommend MiraLAX 1 scoop once or twice a day; 1 scoop in 8 ounces lukewarm water/juice.

## 2024-03-29 NOTE — Assessment & Plan Note (Addendum)
#   AUG 2025-[ LCSP] Dr. Tamea. POSITIVE FOR MALIGNANCY- SQUAMOUS CELL CARCINOMA- CT RIGHT lower lobe, 6.4 cm greatest   PET scan:  Hypermetabolic right lower lobe mass and probable right hilar adenopathy, findings consistent with T2 be N1 M0 or stage II B primary bronchogenic carcinoma.  AUG 2025-  brain MRI-NEG. Treatment: Definitive weekly carbotaxol with the daily radiation. [Start- 03/15/24- 04/29/2024]  # Proceed with cycle #3  of planned- 6-7 of CarboTaxol weekly. Labs-CBC/chemistries were reviewed with the patient.  # Difficulty sleeping sec to steroids- will reduce the dose of steroids.   # palpitations- inttermittent- monitor for now.  # constipation- G-1-2- Been without BM for 3 days, stool softener not helping. Recommend miralax prn.   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   # Bone health- recommend BMD; but declines- recommend calcium 1200 mg/day;OFF vit D- high dose vit D- -[June 2025]- HIGH will rechcek vit D levels.   # active smoker: < 1ppd; recommend quitting-   # IV ACCESS: s/p port placement.   weekly-  labs/port- MD  chemo- x 3 at  time- 10/6- NO MD PS-  # DISPOSITION: # chemo today-  # as per IS- weekly-  labs/port- MD  chemo # on 10/13- labs- chemo-APP--Dr.B

## 2024-03-29 NOTE — Progress Notes (Signed)
 C/o just not feeling good x3 days, fatigue. BP at home 124/49, 116/55, 121/55, pulse jumping up, feels like it is beating fast.  Asking if you can cut back on the steroid? It is keeping her up at night making it hard to work the next day.  Been without BM for 3 days, stool softener not helping. Can you rx something?

## 2024-03-29 NOTE — Progress Notes (Signed)
 I have notified the patient. She will call back to schedule a follow up appt in Oct.  Nothing further needed.

## 2024-03-29 NOTE — Progress Notes (Signed)
 [facility] CONSULT NOTE  Patient Care Team: Odell Chard, Edra GRADE, MD as PCP - General (Family Medicine) Dellie Louanne MATSU, MD (General Surgery) Cindie Jesusa HERO, RN as Registered Nurse Georgina Shasta POUR, RN as Oncology Nurse Navigator Rennie Cindy SAUNDERS, MD as Consulting Physician (Oncology) Verdene Gills, RN as Oncology Nurse Navigator  CHIEF COMPLAINTS/PURPOSE OF CONSULTATION: Lung cancer/breast cancer  #  Oncology History Overview Note  BRCA negative 2013    Breast cancer [facility]) 2005    left breast ca   Breast cancer [facility]) 2006, 1999    right breast ca   Cancer [facility] [phone]   # Left breast- 1999 mild ducts- s/p lumpectomy; s/p RT- no chemo [Dr.Sankar/Dr.Byrnett]  # 2006 ? Stage- s/p right mastectomy s/p chemo [Dr.Choksi]; Dr.Chrystal     DIAGNOSIS: A.  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 Size of invasive carcinoma: 2 mm Mm in this sample Histologic grade of invasive carcinoma: Grade 2                      Glandular/tubular differentiation score: 3                      Nuclear pleomorphism score: 3                      Mitotic rate score: 1                      Total score: 7 Ductal carcinoma in situ: Present, high-grade comedo type with associated calcifications Lymphovascular invasion: Not identified  Comment: The definitive grade will be assigned on the excisional specimen. ER/PR/HER2: Immunohistochemistry will be performed on block A2, with reflex to FISH for HER2 2+. The results will be reported in an addendum.  GROSS DESCRIPTION: A. Labeled: Left breast stereo biopsy calcs upper outer quadrant mid depth Received: in a formalin-filled Brevera collection device Specimen radiograph image(s) available for review Time/Date in fixative: Collected at 10:58 AM on 10/23/2022 and placed in formalin at 11:01 AM on 10/23/2022 Cold ischemic time: Less  than 5 minutes Total fixation time: Approximately 9.25 hours Core pieces: Multiple Measurement: Aggregate, 8.5 x 1.5 x 0.3 cm Description / comments: Received are cores and fragments of yellow fibrofatty tissue.  The accompanying diagram has sections B and G checked. Inked: Green Entirely submitted in cassette(s):  1 - section B 2 - section G 3 - 7 - remaining tissue fragments      3 - sections A and C      4 - sections D and E      5 - sections F and H      6 - sections I and J      7 - sections K and L with remaining free-floating fragments  RB 10/23/2022  Final Diagnosis performed by Rexene Daily, MD.   Electronically signed 10/24/2022 10:40:25AM The electronic signature indicates that the named Attending Pathologist has evaluated the specimen Technical component performed at [facility], [address], [place], [place] [address] Lab: [phone] Dir: Frankey Sas, MD, MMM  Professional component performed at [facility], [facility], [address] [place], [place], [place] [address] Lab: [phone] Dir: Wilkie EMERSON Molt, MD  ADDENDUM: CASE SUMMARY: BREAST BIOMARKER TESTS Estrogen Receptor (ER) Status: POSITIVE         Percentage of cells with nuclear positivity: Greater than 90%  Average intensity of staining: Strong  Progesterone Receptor (PgR) Status: NEGATIVE (LESS THAN 1%)         Internal control cells present and stain as expected  HER2 (by immunohistochemistry): NEGATIVE (Score 1+)  Histologic Type: Invasive carcinoma of no special type (ductal) Histologic Grade (Nottingham Histologic Score)      Glandular (Acinar)/Tubular Differentiation: 3      Nuclear Pleomorphism: 3      Mitotic Rate: 3      Overall Grade: Grade 3 Tumor Size: At least 17 mm Tumor Focality: Single focus of invasive carcinoma Ductal Carcinoma In Situ (DCIS): Present      Positive for extensive intraductal component (EIC) Tumor Extent: Not applicable Lymphatic and/or  Vascular Invasion: Not identified  Treatment Effect in the Breast: No known presurgical therapy  MARGINS Margin Status for Invasive Carcinoma: All margins negative for invasive carcinoma   APRIL/MAY 2024-left breast invasive mammary carcinoma s/p mastectomy [Dr. Rodenberg]-at least pT1c [17 mm]; 2 sentinel lymph nodes negative; grade 3; Oncotype 35 high risk-declined chemotherapy.   # # AUG 2025-[ LCSP] Dr. Tamea. POSITIVE FOR MALIGNANCY- SQUAMOUS CELL CARCINOMA- CT RIGHT lower lobe, 6.4 cm greatest   PET scan:  Hypermetabolic right lower lobe mass and probable right hilar adenopathy, findings consistent with T2 be N1 M0 or stage II B primary bronchogenic carcinoma.  AUG brain MRI-NEG.   # Treatment: Definitive weekly carbotaxol with the daily radiation. [Start- 03/15/24]       Carcinoma of upper-outer quadrant of left breast in female, estrogen receptor positive (HCC)  10/30/2022 Initial Diagnosis   Carcinoma of upper-outer quadrant of left breast in female, estrogen receptor positive   10/30/2022 Cancer Staging   Staging form: Breast, AJCC 8th Edition - Clinical: Stage IA (cT1a, cN0, cM0, G2, ER+, PR-, HER2-) - Signed by Rennie Cindy SAUNDERS, MD on 10/30/2022 Histologic grading system: 3 grade system   12/05/2022 - 12/05/2022 Chemotherapy   Patient is on Treatment Plan : BREAST TC q21d     Primary cancer of right lower lobe of lung (HCC)  02/23/2024 Initial Diagnosis   Primary cancer of right lower lobe of lung (HCC)   02/23/2024 Cancer Staging   Staging form: Lung, AJCC V9 - Clinical: Stage IIB (cT2b, cN1, cM0) - Signed by Rennie Cindy SAUNDERS, MD on 02/23/2024   03/15/2024 -  Chemotherapy   Patient is on Treatment Plan : LUNG Carboplatin  + Paclitaxel  + XRT q7d      HISTORY OF PRESENTING ILLNESS: Patient ambulating-independently.  Alone.   Gabriella Green 73 y.o.  female patient  with history of breast cancer stage I on anastrozole  and right lower lobe lung cancer stage IIb  -currently on definitive chemoradiation is here for follow-up.  Patient not feeling good x3 days, fatigue.  Patient noted intermittent  palpitations.    Patient noted to difficulty sleeping sec to steroids. Been without BM for 3 days, stool softener not helping. No blood in stools.   Denies any worsening shortness of breath or cough.  Appetite fair.  No weight loss.  Review of Systems  Constitutional:  Negative for chills, diaphoresis, fever, malaise/fatigue and weight loss.  HENT:  Negative for nosebleeds and sore throat.   Eyes:  Negative for double vision.  Respiratory:  Positive for cough. Negative for hemoptysis, sputum production, shortness of breath and wheezing.   Cardiovascular:  Negative for chest pain, palpitations, orthopnea and leg swelling.  Gastrointestinal:  Negative for abdominal pain, blood in stool, constipation, diarrhea, heartburn, melena, nausea and  vomiting.  Genitourinary:  Negative for dysuria, frequency and urgency.  Musculoskeletal:  Positive for back pain and joint pain.  Skin: Negative.  Negative for itching and rash.  Neurological:  Negative for dizziness, tingling, focal weakness, weakness and headaches.  Endo/Heme/Allergies:  Does not bruise/bleed easily.  Psychiatric/Behavioral:  Negative for depression. The patient is not nervous/anxious and does not have insomnia.      MEDICAL HISTORY:  Past Medical History:  Diagnosis Date   BRCA negative 2013   Breast cancer (HCC) 2005   left breast ca   Breast cancer (HCC) 2006, 1999   right breast ca   COPD (chronic obstructive pulmonary disease) (HCC)    Mass of right lung    Multiple lung nodules    Personal history of chemotherapy    2006 rt breast ca   Personal history of radiation therapy    PONV (postoperative nausea and vomiting)    RBBB (right bundle branch block)     SURGICAL HISTORY: Past Surgical History:  Procedure Laterality Date   BREAST BIOPSY Left 2019    ADENOSIS, SKELETAL MUSCLE of  LEFT breast   BREAST BIOPSY Left 10/23/2022   Left Breast Stereo Bx, X clip - path pending   BREAST BIOPSY Left 10/23/2022   MM LT BREAST BX W LOC DEV 1ST LESION IMAGE BX SPEC STEREO GUIDE 10/23/2022 [facility]   BREAST CYST ASPIRATION Bilateral    BREAST EXCISIONAL BIOPSY Right 1999   rad breast ca   BREAST EXCISIONAL BIOPSY Left 2005   rad breast ca   BREAST EXCISIONAL BIOPSY Right 2006   mastectomy chemo    BREAST LUMPECTOMY Left 2005   1999 right lumpectomy   CHOLECYSTECTOMY  2002   COLONOSCOPY  2009   COLONOSCOPY WITH PROPOFOL  N/A 09/03/2018   Procedure: COLONOSCOPY WITH PROPOFOL ;  Surgeon: Unk Corinn Skiff, MD;  Location: [facility];  Service: Gastroenterology;  Laterality: N/A;   ENDOBRONCHIAL ULTRASOUND Bilateral 02/16/2024   Procedure: ENDOBRONCHIAL ULTRASOUND (EBUS);  Surgeon: Tamea Dedra CROME, MD;  Location: [facility];  Service: Pulmonary;  Laterality: Bilateral;   ESOPHAGOGASTRODUODENOSCOPY (EGD) WITH PROPOFOL  N/A 09/03/2018   Procedure: ESOPHAGOGASTRODUODENOSCOPY (EGD) WITH PROPOFOL ;  Surgeon: Unk Corinn Skiff, MD;  Location: [facility];  Service: Gastroenterology;  Laterality: N/A;   IR IMAGING GUIDED PORT INSERTION  03/04/2024   MASTECTOMY Right 2006   MASTECTOMY W/ SENTINEL NODE BIOPSY Left 11/13/2022   Procedure: MASTECTOMY WITH SENTINEL LYMPH NODE BIOPSY, total mastectomy;  Surgeon: Lane Shope, MD;  Location: [facility] OR[facility];  Service: General;  Laterality: Left;   VIDEO BRONCHOSCOPY WITH ENDOBRONCHIAL NAVIGATION Bilateral 02/16/2024   Procedure: VIDEO BRONCHOSCOPY WITH ENDOBRONCHIAL NAVIGATION;  Surgeon: Tamea Dedra CROME, MD;  Location: [facility];  Service: Pulmonary;  Laterality: Bilateral;    SOCIAL HISTORY: Social History   Socioeconomic History   Marital status: Widowed    Spouse name: Not on file   Number of children: Not on file   Years of education: Not on file   Highest education level: Not on file  Occupational History   Not on file   Tobacco Use   Smoking status: Every Day    Current packs/day: 0.50    Average packs/day: 1 pack/day for 45.7 years (45.6 ttl pk-yrs)    Types: Cigarettes    Start date: 1980    Passive exposure: Past   Smokeless tobacco: Never  Vaping Use   Vaping status: Never Used  Substance and Sexual Activity   Alcohol use: No   Drug use: No  Sexual activity: Not on file  Other Topics Concern   Not on file  Social History Narrative   Not on file   Social Drivers of Health   Financial Resource Strain: Not on file  Food Insecurity: No Food Insecurity (10/30/2022)   Hunger Vital Sign    Worried About Running Out of Food in the Last Year: Never true    Ran Out of Food in the Last Year: Never true  Transportation Needs: No Transportation Needs (10/30/2022)   PRAPARE - Administrator, Civil Service (Medical): No    Lack of Transportation (Non-Medical): No  Physical Activity: Not on file  Stress: Not on file  Social Connections: Not on file  Intimate Partner Violence: Not on file    FAMILY HISTORY: Family History  Problem Relation Age of Onset   Breast cancer Mother 68   Breast cancer Sister 85   Breast cancer Maternal Aunt 40    ALLERGIES:  is allergic to codeine and tape.  MEDICATIONS:  Current Outpatient Medications  Medication Sig Dispense Refill   anastrozole  (ARIMIDEX ) 1 MG tablet TAKE 1 TABLET(1 MG) BY MOUTH DAILY (Patient taking differently: Take 1 mg by mouth at bedtime.) 90 tablet 1   aspirin EC 81 MG tablet Take 81 mg by mouth at bedtime. Swallow whole.     lidocaine -prilocaine  (EMLA ) cream Apply to affected area once 30 g 3   omeprazole  (PRILOSEC) 40 MG capsule Take 1 capsule (40 mg total) by mouth daily. 30 capsule 2   ondansetron  (ZOFRAN ) 8 MG tablet Take 1 tablet (8 mg total) by mouth every 8 (eight) hours as needed for nausea or vomiting. Start on the third day after chemotherapy. 30 tablet 1   ondansetron  (ZOFRAN -ODT) 4 MG disintegrating tablet Take 4 mg  by mouth every 8 (eight) hours as needed.     prochlorperazine  (COMPAZINE ) 10 MG tablet Take 1 tablet (10 mg total) by mouth every 6 (six) hours as needed for nausea or vomiting. 30 tablet 1   No current facility-administered medications for this visit.   Facility-Administered Medications Ordered in Other Visits  Medication Dose Route Frequency Provider Last Rate Last Admin   0.9 %  sodium chloride  infusion   Intravenous Continuous Pranish Akhavan R, MD 10 mL/hr at 03/29/24 0924 New Bag at 03/29/24 9075   CARBOplatin  (PARAPLATIN ) 110 mg in sodium chloride  0.9 % 100 mL chemo infusion  110 mg Intravenous Once Sia Gabrielsen R, MD       famotidine  (PEPCID ) IVPB 20 mg premix  20 mg Intravenous Once Meerab Maselli R, MD       PACLitaxel  (TAXOL ) 54 mg in sodium chloride  0.9 % 150 mL chemo infusion (</= 80mg /m2)  45 mg/m2 (Treatment Plan Recorded) Intravenous Once Marranda Arakelian R, MD         PHYSICAL EXAMINATION:   Vitals:   03/29/24 0822  BP: 138/61  Pulse: 82  Resp: 20  Temp: 97.6 F (36.4 C)  SpO2: 100%       Filed Weights   03/29/24 0822  Weight: 82 lb (37.2 kg)     Physical Exam Vitals and nursing note reviewed.  HENT:     Head: Normocephalic and atraumatic.     Mouth/Throat:     Pharynx: Oropharynx is clear.  Eyes:     Extraocular Movements: Extraocular movements intact.     Pupils: Pupils are equal, round, and reactive to light.  Cardiovascular:     Rate and Rhythm: Normal rate and regular  rhythm.  Pulmonary:     Comments: Decreased breath sounds bilaterally.  Abdominal:     Palpations: Abdomen is soft.  Musculoskeletal:        General: Normal range of motion.     Cervical back: Normal range of motion.  Skin:    General: Skin is warm.  Neurological:     General: No focal deficit present.     Mental Status: She is alert and oriented to person, place, and time.  Psychiatric:        Behavior: Behavior normal.        Judgment: Judgment  normal.      LABORATORY DATA:  I have reviewed the data as listed Lab Results  Component Value Date   WBC 7.7 03/29/2024   HGB 11.7 (L) 03/29/2024   HCT 34.9 (L) 03/29/2024   MCV 91.6 03/29/2024   PLT 392 03/29/2024   Recent Labs    03/15/24 0816 03/22/24 0836 03/29/24 0825  NA 137 137 134*  K 4.0 4.1 4.0  CL 103 105 104  CO2 24 25 23   GLUCOSE 99 98 103*  BUN 20 18 13   CREATININE 0.46 0.50 0.48  CALCIUM 9.3 9.1 8.8*  GFRNONAA >60 >60 >60  PROT 7.5 7.2 7.2  ALBUMIN 3.5 3.4* 3.3*  AST 20 18 16   ALT 13 14 12   ALKPHOS 78 70 71  BILITOT 0.4 0.5 0.4     RADIOGRAPHIC STUDIES: I have personally reviewed the radiological images as listed and agreed with the findings in the report. IR IMAGING GUIDED PORT INSERTION Result Date: 03/04/2024 CLINICAL DATA:  Squamous lung carcinoma of the right lower lobe and need for porta cath for chemotherapy. EXAM: IMPLANTED PORT A CATH PLACEMENT WITH ULTRASOUND AND FLUOROSCOPIC GUIDANCE ANESTHESIA/SEDATION: Moderate (conscious) sedation was employed during this procedure. A total of Versed  2.0 mg and Fentanyl  100 mcg was administered intravenously. Moderate Sedation Time: 22 minutes. The patient's level of consciousness and vital signs were monitored continuously by radiology nursing throughout the procedure under my direct supervision. FLUOROSCOPY: Radiation Exposure Index: 1.0 mGy Kerma PROCEDURE: The procedure, risks, benefits, and alternatives were explained to the patient. Questions regarding the procedure were encouraged and answered. The patient understands and consents to the procedure. A time-out was performed prior to initiating the procedure. Ultrasound was utilized to confirm patency of the left internal jugular vein. An ultrasound image was saved and recorded. The left neck and chest were prepped with chlorhexidine  in a sterile fashion, and a sterile drape was applied covering the operative field. Maximum barrier sterile technique with  sterile gowns and gloves were used for the procedure. Local anesthesia was provided with 1% lidocaine . After creating a small venotomy incision, a 21 gauge needle was advanced into the left internal jugular vein under direct, real-time ultrasound guidance. Ultrasound image documentation was performed. After securing guidewire access, an 8 Fr dilator was placed. A J-wire was kinked to measure appropriate catheter length. A subcutaneous port pocket was then created along the upper chest wall utilizing sharp and blunt dissection. Portable cautery was utilized. The pocket was irrigated with sterile saline. A single lumen power injectable port was chosen for placement. The 8 Fr catheter was tunneled from the port pocket site to the venotomy incision. The port was placed in the pocket. External catheter was trimmed to appropriate length based on guidewire measurement. At the venotomy, an 8 Fr peel-away sheath was placed over a guidewire. The catheter was then placed through the sheath and the sheath removed.  Final catheter positioning was confirmed and documented with a fluoroscopic spot image. The port was accessed with a needle and aspirated and flushed with heparinized saline. The access needle was removed. The venotomy and port pocket incisions were closed with subcutaneous 3-0 Monocryl and subcuticular 4-0 Vicryl. Dermabond was applied to both incisions. COMPLICATIONS: COMPLICATIONS None FINDINGS: After catheter placement, the tip lies at the cavo-atrial junction. The catheter aspirates normally and is ready for immediate use. IMPRESSION: Placement of single lumen port a cath via left internal jugular vein. The catheter tip lies at the cavo-atrial junction. A power injectable port a cath was placed and is ready for immediate use. Electronically Signed   By: Marcey Moan M.D.   On: 03/04/2024 14:45    ASSESSMENT & PLAN:   Primary cancer of right lower lobe of lung (HCC) # AUG 2025-[ LCSP] Dr. Tamea.  POSITIVE FOR MALIGNANCY- SQUAMOUS CELL CARCINOMA- CT RIGHT lower lobe, 6.4 cm greatest   PET scan:  Hypermetabolic right lower lobe mass and probable right hilar adenopathy, findings consistent with T2 be N1 M0 or stage II B primary bronchogenic carcinoma.  AUG 2025-  brain MRI-NEG. Treatment: Definitive weekly carbotaxol with the daily radiation. [Start- 03/15/24- 04/29/2024]  # Proceed with cycle #3  of planned- 6-7 of CarboTaxol weekly. Labs-CBC/chemistries were reviewed with the patient.  # Difficulty sleeping sec to steroids- will reduce the dose of steroids.   # palpitations- inttermittent- monitor for now.  # constipation- G-1-2- Been without BM for 3 days, stool softener not helping. Recommend miralax prn.   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   # Bone health- recommend BMD; but declines- recommend calcium 1200 mg/day;OFF vit D- high dose vit D- -[June 2025]- HIGH will rechcek vit D levels.   # active smoker: < 1ppd; recommend quitting-   # IV ACCESS: s/p port placement.   weekly-  labs/port- MD  chemo- x 3 at  time- 10/6- NO MD PS-  # DISPOSITION: # chemo today-  # as per IS- weekly-  labs/port- MD  chemo # on 10/13- labs- chemo-APP--Dr.B     All questions were answered. The patient/family knows to call the clinic with any problems, questions or concerns.    Cindy JONELLE Joe, MD 03/29/2024 9:43 AM

## 2024-03-30 ENCOUNTER — Other Ambulatory Visit: Payer: Self-pay

## 2024-03-30 ENCOUNTER — Ambulatory Visit
Admission: RE | Admit: 2024-03-30 | Discharge: 2024-03-30 | Disposition: A | Discharge status: Home / Self Care | Source: Ambulatory Visit | Attending: Radiation Oncology | Admitting: Radiation Oncology

## 2024-03-30 DIAGNOSIS — C3431 Malignant neoplasm of lower lobe, right bronchus or lung: Secondary | ICD-10-CM | POA: Diagnosis not present

## 2024-03-30 LAB — RAD ONC ARIA SESSION SUMMARY
Course Elapsed Days: 14
Plan Fractions Treated to Date: 11
Plan Prescribed Dose Per Fraction: 2 Gy
Plan Total Fractions Prescribed: 33
Plan Total Prescribed Dose: 66 Gy
Reference Point Dosage Given to Date: 22 Gy
Reference Point Session Dosage Given: 2 Gy
Session Number: 11

## 2024-03-31 ENCOUNTER — Other Ambulatory Visit: Payer: Self-pay

## 2024-03-31 ENCOUNTER — Encounter: Payer: Self-pay | Admitting: Internal Medicine

## 2024-03-31 ENCOUNTER — Ambulatory Visit
Admission: RE | Admit: 2024-03-31 | Discharge: 2024-03-31 | Disposition: A | Discharge status: Home / Self Care | Source: Ambulatory Visit | Attending: Radiation Oncology | Admitting: Radiation Oncology

## 2024-03-31 DIAGNOSIS — C3431 Malignant neoplasm of lower lobe, right bronchus or lung: Secondary | ICD-10-CM | POA: Diagnosis not present

## 2024-03-31 LAB — RAD ONC ARIA SESSION SUMMARY
Course Elapsed Days: 15
Plan Fractions Treated to Date: 12
Plan Prescribed Dose Per Fraction: 2 Gy
Plan Total Fractions Prescribed: 33
Plan Total Prescribed Dose: 66 Gy
Reference Point Dosage Given to Date: 24 Gy
Reference Point Session Dosage Given: 2 Gy
Session Number: 12

## 2024-04-01 ENCOUNTER — Ambulatory Visit
Admission: RE | Admit: 2024-04-01 | Discharge: 2024-04-01 | Disposition: A | Discharge status: Home / Self Care | Source: Ambulatory Visit | Attending: Radiation Oncology | Admitting: Radiation Oncology

## 2024-04-01 ENCOUNTER — Other Ambulatory Visit: Payer: Self-pay

## 2024-04-01 DIAGNOSIS — C3431 Malignant neoplasm of lower lobe, right bronchus or lung: Secondary | ICD-10-CM | POA: Diagnosis not present

## 2024-04-01 LAB — RAD ONC ARIA SESSION SUMMARY
Course Elapsed Days: 16
Plan Fractions Treated to Date: 13
Plan Prescribed Dose Per Fraction: 2 Gy
Plan Total Fractions Prescribed: 33
Plan Total Prescribed Dose: 66 Gy
Reference Point Dosage Given to Date: 26 Gy
Reference Point Session Dosage Given: 2 Gy
Session Number: 13

## 2024-04-02 ENCOUNTER — Ambulatory Visit
Admission: RE | Admit: 2024-04-02 | Discharge: 2024-04-02 | Disposition: A | Discharge status: Home / Self Care | Source: Ambulatory Visit | Attending: Radiation Oncology | Admitting: Radiation Oncology

## 2024-04-02 ENCOUNTER — Other Ambulatory Visit: Payer: Self-pay

## 2024-04-02 DIAGNOSIS — C3431 Malignant neoplasm of lower lobe, right bronchus or lung: Secondary | ICD-10-CM | POA: Diagnosis not present

## 2024-04-02 LAB — RAD ONC ARIA SESSION SUMMARY
Course Elapsed Days: 17
Plan Fractions Treated to Date: 14
Plan Prescribed Dose Per Fraction: 2 Gy
Plan Total Fractions Prescribed: 33
Plan Total Prescribed Dose: 66 Gy
Reference Point Dosage Given to Date: 28 Gy
Reference Point Session Dosage Given: 2 Gy
Session Number: 14

## 2024-04-05 ENCOUNTER — Encounter: Payer: Self-pay | Admitting: Internal Medicine

## 2024-04-05 ENCOUNTER — Inpatient Hospital Stay

## 2024-04-05 ENCOUNTER — Inpatient Hospital Stay: Admitting: Internal Medicine

## 2024-04-05 ENCOUNTER — Ambulatory Visit
Admission: RE | Admit: 2024-04-05 | Discharge: 2024-04-05 | Disposition: A | Discharge status: Home / Self Care | Source: Ambulatory Visit | Attending: Radiation Oncology | Admitting: Radiation Oncology

## 2024-04-05 ENCOUNTER — Encounter: Payer: Self-pay | Admitting: *Deleted

## 2024-04-05 ENCOUNTER — Other Ambulatory Visit: Payer: Self-pay

## 2024-04-05 VITALS — BP 124/63 | HR 81 | Temp 97.6°F | Resp 18 | Ht 61.0 in | Wt 81.3 lb

## 2024-04-05 VITALS — BP 106/52 | HR 70 | Resp 18

## 2024-04-05 DIAGNOSIS — C3431 Malignant neoplasm of lower lobe, right bronchus or lung: Secondary | ICD-10-CM

## 2024-04-05 DIAGNOSIS — Z5111 Encounter for antineoplastic chemotherapy: Secondary | ICD-10-CM | POA: Diagnosis not present

## 2024-04-05 LAB — CBC WITH DIFFERENTIAL (CANCER CENTER ONLY)
Abs Immature Granulocytes: 0.03 K/uL (ref 0.00–0.07)
Basophils Absolute: 0.1 K/uL (ref 0.0–0.1)
Basophils Relative: 1 %
Eosinophils Absolute: 0.1 K/uL (ref 0.0–0.5)
Eosinophils Relative: 2 %
HCT: 34 % — ABNORMAL LOW (ref 36.0–46.0)
Hemoglobin: 11.6 g/dL — ABNORMAL LOW (ref 12.0–15.0)
Immature Granulocytes: 1 %
Lymphocytes Relative: 10 %
Lymphs Abs: 0.5 K/uL — ABNORMAL LOW (ref 0.7–4.0)
MCH: 30.9 pg (ref 26.0–34.0)
MCHC: 34.1 g/dL (ref 30.0–36.0)
MCV: 90.7 fL (ref 80.0–100.0)
Monocytes Absolute: 0.7 K/uL (ref 0.1–1.0)
Monocytes Relative: 14 %
Neutro Abs: 3.8 K/uL (ref 1.7–7.7)
Neutrophils Relative %: 72 %
Platelet Count: 398 K/uL (ref 150–400)
RBC: 3.75 MIL/uL — ABNORMAL LOW (ref 3.87–5.11)
RDW: 13.9 % (ref 11.5–15.5)
WBC Count: 5.3 K/uL (ref 4.0–10.5)
nRBC: 0 % (ref 0.0–0.2)

## 2024-04-05 LAB — CMP (CANCER CENTER ONLY)
ALT: 15 U/L (ref 0–44)
AST: 18 U/L (ref 15–41)
Albumin: 3.4 g/dL — ABNORMAL LOW (ref 3.5–5.0)
Alkaline Phosphatase: 75 U/L (ref 38–126)
Anion gap: 8 (ref 5–15)
BUN: 14 mg/dL (ref 8–23)
CO2: 24 mmol/L (ref 22–32)
Calcium: 9.3 mg/dL (ref 8.9–10.3)
Chloride: 104 mmol/L (ref 98–111)
Creatinine: 0.51 mg/dL (ref 0.44–1.00)
GFR, Estimated: >60 mL/min (ref >60)
Glucose, Bld: 106 mg/dL — ABNORMAL HIGH (ref 70–99)
Potassium: 4.3 mmol/L (ref 3.5–5.1)
Sodium: 136 mmol/L (ref 135–145)
Total Bilirubin: 0.5 mg/dL (ref 0.0–1.2)
Total Protein: 6.9 g/dL (ref 6.5–8.1)

## 2024-04-05 LAB — RAD ONC ARIA SESSION SUMMARY
Course Elapsed Days: 20
Plan Fractions Treated to Date: 15
Plan Prescribed Dose Per Fraction: 2 Gy
Plan Total Fractions Prescribed: 33
Plan Total Prescribed Dose: 66 Gy
Reference Point Dosage Given to Date: 30 Gy
Reference Point Session Dosage Given: 2 Gy
Session Number: 15

## 2024-04-05 MED ORDER — PALONOSETRON HCL INJECTION 0.25 MG/5ML
0.2500 mg | Freq: Once | INTRAVENOUS | Status: AC
  Administered 2024-04-05: 0.25 mg via INTRAVENOUS
  Filled 2024-04-05: qty 5

## 2024-04-05 MED ORDER — SODIUM CHLORIDE 0.9 % IV SOLN
108.6000 mg | Freq: Once | INTRAVENOUS | Status: AC
  Administered 2024-04-05: 110 mg via INTRAVENOUS
  Filled 2024-04-05: qty 11

## 2024-04-05 MED ORDER — FAMOTIDINE IN NACL 20-0.9 MG/50ML-% IV SOLN
20.0000 mg | Freq: Once | INTRAVENOUS | Status: AC
  Administered 2024-04-05: 20 mg via INTRAVENOUS
  Filled 2024-04-05: qty 50

## 2024-04-05 MED ORDER — SODIUM CHLORIDE 0.9 % IV SOLN
INTRAVENOUS | Status: DC
  Filled 2024-04-05: qty 250

## 2024-04-05 MED ORDER — DIPHENHYDRAMINE HCL 50 MG/ML IJ SOLN
50.0000 mg | Freq: Once | INTRAMUSCULAR | Status: AC
  Administered 2024-04-05: 50 mg via INTRAVENOUS
  Filled 2024-04-05: qty 1

## 2024-04-05 MED ORDER — SODIUM CHLORIDE 0.9 % IV SOLN
45.0000 mg/m2 | Freq: Once | INTRAVENOUS | Status: AC
  Administered 2024-04-05: 54 mg via INTRAVENOUS
  Filled 2024-04-05: qty 9

## 2024-04-05 MED ORDER — DEXAMETHASONE SODIUM PHOSPHATE 10 MG/ML IJ SOLN
4.0000 mg | Freq: Once | INTRAMUSCULAR | Status: AC
  Administered 2024-04-05: 4 mg via INTRAVENOUS
  Filled 2024-04-05: qty 1

## 2024-04-05 NOTE — Patient Instructions (Signed)
 [facility] - A DEPT OF [facility]  Discharge Instructions: Thank you for choosing [facility] to provide your oncology and hematology care.  If you have a lab appointment with the Cancer Center, please go directly to the Cancer Center and check in at the registration area.  Wear comfortable clothing and clothing appropriate for easy access to any Portacath or PICC line.   We strive to give you quality time with your provider. You may need to reschedule your appointment if you arrive late (15 or more minutes).  Arriving late affects you and other patients whose appointments are after yours.  Also, if you miss three or more appointments without notifying the office, you may be dismissed from the clinic at the provider's discretion.      For prescription refill requests, have your pharmacy contact our office and allow 72 hours for refills to be completed.    Today you received the following chemotherapy and/or immunotherapy agents- taxol, carboplatin      To help prevent nausea and vomiting after your treatment, we encourage you to take your nausea medication as directed.  BELOW ARE SYMPTOMS THAT SHOULD BE REPORTED IMMEDIATELY: *FEVER GREATER THAN 100.4 F (38 C) OR HIGHER *CHILLS OR SWEATING *NAUSEA AND VOMITING THAT IS NOT CONTROLLED WITH YOUR NAUSEA MEDICATION *UNUSUAL SHORTNESS OF BREATH *UNUSUAL BRUISING OR BLEEDING *URINARY PROBLEMS (pain or burning when urinating, or frequent urination) *BOWEL PROBLEMS (unusual diarrhea, constipation, pain near the anus) TENDERNESS IN MOUTH AND THROAT WITH OR WITHOUT PRESENCE OF ULCERS (sore throat, sores in mouth, or a toothache) UNUSUAL RASH, SWELLING OR PAIN  UNUSUAL VAGINAL DISCHARGE OR ITCHING   Items with * indicate a potential emergency and should be followed up as soon as possible or go to the Emergency Department if any problems should occur.  Please show the CHEMOTHERAPY ALERT CARD or  IMMUNOTHERAPY ALERT CARD at check-in to the Emergency Department and triage nurse.  Should you have questions after your visit or need to cancel or reschedule your appointment, please contact [facility] - A DEPT OF [facility]  [phone] and follow the prompts.  Office hours are 8:00 a.m. to 4:30 p.m. Monday - Friday. Please note that voicemails left after 4:00 p.m. may not be returned until the following business day.  We are closed weekends and major holidays. You have access to a nurse at all times for urgent questions. Please call the main number to the clinic [phone] and follow the prompts.  For any non-urgent questions, you may also contact your provider using MyChart. We now offer e-Visits for anyone 36 and older to request care online for non-urgent symptoms. For details visit [email or web address].   Also download the MyChart app! Go to the app store, search "MyChart", open the app, select [place], and log in with your MyChart username and password.

## 2024-04-05 NOTE — Progress Notes (Signed)
 [facility] CONSULT NOTE  Patient Care Team: Odell Chard, Edra GRADE, MD as PCP - General (Family Medicine) Dellie Louanne MATSU, MD (General Surgery) Cindie Jesusa HERO, RN as Registered Nurse Georgina Shasta POUR, RN as Oncology Nurse Navigator Rennie Cindy SAUNDERS, MD as Consulting Physician (Oncology) Verdene Gills, RN as Oncology Nurse Navigator  CHIEF COMPLAINTS/PURPOSE OF CONSULTATION: Lung cancer/breast cancer  #  Oncology History Overview Note  BRCA negative 2013    Breast cancer [facility]) 2005    left breast ca   Breast cancer [facility]) 2006, 1999    right breast ca   Cancer [facility]) [phone]   # Left breast- 1999 mild ducts- s/p lumpectomy; s/p RT- no chemo [Dr.Sankar/Dr.Byrnett]  # 2006 ? Stage- s/p right mastectomy s/p chemo [Dr.Choksi]; Dr.Chrystal     DIAGNOSIS: A.  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 Size of invasive carcinoma: 2 mm Mm in this sample Histologic grade of invasive carcinoma: Grade 2                      Glandular/tubular differentiation score: 3                      Nuclear pleomorphism score: 3                      Mitotic rate score: 1                      Total score: 7 Ductal carcinoma in situ: Present, high-grade comedo type with associated calcifications Lymphovascular invasion: Not identified  Comment: The definitive grade will be assigned on the excisional specimen. ER/PR/HER2: Immunohistochemistry will be performed on block A2, with reflex to FISH for HER2 2+. The results will be reported in an addendum.  GROSS DESCRIPTION: A. Labeled: Left breast stereo biopsy calcs upper outer quadrant mid depth Received: in a formalin-filled Brevera collection device Specimen radiograph image(s) available for review Time/Date in fixative: Collected at 10:58 AM on 10/23/2022 and placed in formalin at 11:01 AM on 10/23/2022 Cold ischemic time: Less  than 5 minutes Total fixation time: Approximately 9.25 hours Core pieces: Multiple Measurement: Aggregate, 8.5 x 1.5 x 0.3 cm Description / comments: Received are cores and fragments of yellow fibrofatty tissue.  The accompanying diagram has sections B and G checked. Inked: Green Entirely submitted in cassette(s):  1 - section B 2 - section G 3 - 7 - remaining tissue fragments      3 - sections A and C      4 - sections D and E      5 - sections F and H      6 - sections I and J      7 - sections K and L with remaining free-floating fragments  RB 10/23/2022  Final Diagnosis performed by Rexene Daily, MD.   Electronically signed 10/24/2022 10:40:25AM The electronic signature indicates that the named Attending Pathologist has evaluated the specimen Technical component performed at [facility], [address] [place], [place] [address] Lab: [phone] Dir: Frankey Sas, MD, MMM  Professional component performed at [facility], [facility], [address] [place], [place], [place] [address] Lab: [phone] Dir: Wilkie EMERSON Molt, MD  ADDENDUM: CASE SUMMARY: BREAST BIOMARKER TESTS Estrogen Receptor (ER) Status: POSITIVE         Percentage of cells with nuclear positivity: Greater than 90%  Average intensity of staining: Strong  Progesterone Receptor (PgR) Status: NEGATIVE (LESS THAN 1%)         Internal control cells present and stain as expected  HER2 (by immunohistochemistry): NEGATIVE (Score 1+)  Histologic Type: Invasive carcinoma of no special type (ductal) Histologic Grade (Nottingham Histologic Score)      Glandular (Acinar)/Tubular Differentiation: 3      Nuclear Pleomorphism: 3      Mitotic Rate: 3      Overall Grade: Grade 3 Tumor Size: At least 17 mm Tumor Focality: Single focus of invasive carcinoma Ductal Carcinoma In Situ (DCIS): Present      Positive for extensive intraductal component (EIC) Tumor Extent: Not applicable Lymphatic and/or  Vascular Invasion: Not identified  Treatment Effect in the Breast: No known presurgical therapy  MARGINS Margin Status for Invasive Carcinoma: All margins negative for invasive carcinoma   APRIL/MAY 2024-left breast invasive mammary carcinoma s/p mastectomy [Dr. Rodenberg]-at least pT1c [17 mm]; 2 sentinel lymph nodes negative; grade 3; Oncotype 35 high risk-declined chemotherapy.   # # AUG 2025-[ LCSP] Dr. Tamea. POSITIVE FOR MALIGNANCY- SQUAMOUS CELL CARCINOMA- CT RIGHT lower lobe, 6.4 cm greatest   PET scan:  Hypermetabolic right lower lobe mass and probable right hilar adenopathy, findings consistent with T2 be N1 M0 or stage II B primary bronchogenic carcinoma.  AUG brain MRI-NEG.   # Treatment: Definitive weekly carbotaxol with the daily radiation. [Start- 03/15/24]       Carcinoma of upper-outer quadrant of left breast in female, estrogen receptor positive (HCC)  10/30/2022 Initial Diagnosis   Carcinoma of upper-outer quadrant of left breast in female, estrogen receptor positive   10/30/2022 Cancer Staging   Staging form: Breast, AJCC 8th Edition - Clinical: Stage IA (cT1a, cN0, cM0, G2, ER+, PR-, HER2-) - Signed by Rennie Cindy SAUNDERS, MD on 10/30/2022 Histologic grading system: 3 grade system   12/05/2022 - 12/05/2022 Chemotherapy   Patient is on Treatment Plan : BREAST TC q21d     Primary cancer of right lower lobe of lung (HCC)  02/23/2024 Initial Diagnosis   Primary cancer of right lower lobe of lung (HCC)   02/23/2024 Cancer Staging   Staging form: Lung, AJCC V9 - Clinical: Stage IIB (cT2b, cN1, cM0) - Signed by Rennie Cindy SAUNDERS, MD on 02/23/2024   03/15/2024 -  Chemotherapy   Patient is on Treatment Plan : LUNG Carboplatin  + Paclitaxel  + XRT q7d      HISTORY OF PRESENTING ILLNESS: Patient ambulating-independently.  Alone.   Gabriella Green 73 y.o.  female patient  with history of breast cancer stage I on anastrozole  and right lower lobe lung cancer stage IIb  -currently on definitive chemoradiation is here for follow-up.  C/o neck pain and unable to turn it, this is new for her, pain 5/10 x1 week.    No appetite, boost bid. Denies any worsening shortness of breath or cough.  Appetite fair.  No weight loss.   Improved sleeping after decreasing the dose of steroids. Constipation- improved with dulcolax.    Review of Systems  Constitutional:  Negative for chills, diaphoresis, fever, malaise/fatigue and weight loss.  HENT:  Negative for nosebleeds and sore throat.   Eyes:  Negative for double vision.  Respiratory:  Positive for cough. Negative for hemoptysis, sputum production, shortness of breath and wheezing.   Cardiovascular:  Negative for chest pain, palpitations, orthopnea and leg swelling.  Gastrointestinal:  Negative for abdominal pain, blood in stool, constipation, diarrhea, heartburn, melena, nausea and  vomiting.  Genitourinary:  Negative for dysuria, frequency and urgency.  Musculoskeletal:  Positive for back pain and joint pain.  Skin: Negative.  Negative for itching and rash.  Neurological:  Negative for dizziness, tingling, focal weakness, weakness and headaches.  Endo/Heme/Allergies:  Does not bruise/bleed easily.  Psychiatric/Behavioral:  Negative for depression. The patient is not nervous/anxious and does not have insomnia.      MEDICAL HISTORY:  Past Medical History:  Diagnosis Date   BRCA negative 2013   Breast cancer (HCC) 2005   left breast ca   Breast cancer (HCC) 2006, 1999   right breast ca   COPD (chronic obstructive pulmonary disease) (HCC)    Mass of right lung    Multiple lung nodules    Personal history of chemotherapy    2006 rt breast ca   Personal history of radiation therapy    PONV (postoperative nausea and vomiting)    RBBB (right bundle branch block)     SURGICAL HISTORY: Past Surgical History:  Procedure Laterality Date   BREAST BIOPSY Left 2019    ADENOSIS, SKELETAL MUSCLE of LEFT breast    BREAST BIOPSY Left 10/23/2022   Left Breast Stereo Bx, X clip - path pending   BREAST BIOPSY Left 10/23/2022   MM LT BREAST BX W LOC DEV 1ST LESION IMAGE BX SPEC STEREO GUIDE 10/23/2022 [facility]   BREAST CYST ASPIRATION Bilateral    BREAST EXCISIONAL BIOPSY Right 1999   rad breast ca   BREAST EXCISIONAL BIOPSY Left 2005   rad breast ca   BREAST EXCISIONAL BIOPSY Right 2006   mastectomy chemo    BREAST LUMPECTOMY Left 2005   1999 right lumpectomy   CHOLECYSTECTOMY  2002   COLONOSCOPY  2009   COLONOSCOPY WITH PROPOFOL  N/A 09/03/2018   Procedure: COLONOSCOPY WITH PROPOFOL ;  Surgeon: Unk Corinn Skiff, MD;  Location: [facility];  Service: Gastroenterology;  Laterality: N/A;   ENDOBRONCHIAL ULTRASOUND Bilateral 02/16/2024   Procedure: ENDOBRONCHIAL ULTRASOUND (EBUS);  Surgeon: Tamea Dedra CROME, MD;  Location: [facility];  Service: Pulmonary;  Laterality: Bilateral;   ESOPHAGOGASTRODUODENOSCOPY (EGD) WITH PROPOFOL  N/A 09/03/2018   Procedure: ESOPHAGOGASTRODUODENOSCOPY (EGD) WITH PROPOFOL ;  Surgeon: Unk Corinn Skiff, MD;  Location: [facility];  Service: Gastroenterology;  Laterality: N/A;   IR IMAGING GUIDED PORT INSERTION  03/04/2024   MASTECTOMY Right 2006   MASTECTOMY W/ SENTINEL NODE BIOPSY Left 11/13/2022   Procedure: MASTECTOMY WITH SENTINEL LYMPH NODE BIOPSY, total mastectomy;  Surgeon: Lane Shope, MD;  Location: [facility];  Service: General;  Laterality: Left;   VIDEO BRONCHOSCOPY WITH ENDOBRONCHIAL NAVIGATION Bilateral 02/16/2024   Procedure: VIDEO BRONCHOSCOPY WITH ENDOBRONCHIAL NAVIGATION;  Surgeon: Tamea Dedra CROME, MD;  Location: [facility];  Service: Pulmonary;  Laterality: Bilateral;    SOCIAL HISTORY: Social History   Socioeconomic History   Marital status: Widowed    Spouse name: Not on file   Number of children: Not on file   Years of education: Not on file   Highest education level: Not on file  Occupational History   Not on file  Tobacco Use    Smoking status: Every Day    Current packs/day: 0.50    Average packs/day: 1 pack/day for 45.7 years (45.7 ttl pk-yrs)    Types: Cigarettes    Start date: 1980    Passive exposure: Past   Smokeless tobacco: Never  Vaping Use   Vaping status: Never Used  Substance and Sexual Activity   Alcohol use: No   Drug use: No  Sexual activity: Not on file  Other Topics Concern   Not on file  Social History Narrative   Not on file   Social Drivers of Health   Financial Resource Strain: Not on file  Food Insecurity: No Food Insecurity (10/30/2022)   Hunger Vital Sign    Worried About Running Out of Food in the Last Year: Never true    Ran Out of Food in the Last Year: Never true  Transportation Needs: No Transportation Needs (10/30/2022)   PRAPARE - Administrator, Civil Service (Medical): No    Lack of Transportation (Non-Medical): No  Physical Activity: Not on file  Stress: Not on file  Social Connections: Not on file  Intimate Partner Violence: Not on file    FAMILY HISTORY: Family History  Problem Relation Age of Onset   Breast cancer Mother 4   Breast cancer Sister 39   Breast cancer Maternal Aunt 40    ALLERGIES:  is allergic to codeine and tape.  MEDICATIONS:  Current Outpatient Medications  Medication Sig Dispense Refill   anastrozole  (ARIMIDEX ) 1 MG tablet TAKE 1 TABLET(1 MG) BY MOUTH DAILY (Patient taking differently: Take 1 mg by mouth at bedtime.) 90 tablet 1   aspirin EC 81 MG tablet Take 81 mg by mouth at bedtime. Swallow whole.     lidocaine -prilocaine  (EMLA ) cream Apply to affected area once 30 g 3   omeprazole  (PRILOSEC) 40 MG capsule Take 1 capsule (40 mg total) by mouth daily. 30 capsule 2   ondansetron  (ZOFRAN ) 8 MG tablet Take 1 tablet (8 mg total) by mouth every 8 (eight) hours as needed for nausea or vomiting. Start on the third day after chemotherapy. 30 tablet 1   ondansetron  (ZOFRAN -ODT) 4 MG disintegrating tablet Take 4 mg by mouth every 8  (eight) hours as needed.     prochlorperazine  (COMPAZINE ) 10 MG tablet Take 1 tablet (10 mg total) by mouth every 6 (six) hours as needed for nausea or vomiting. 30 tablet 1   No current facility-administered medications for this visit.     PHYSICAL EXAMINATION:   Vitals:   04/05/24 0816  BP: 124/63  Pulse: 81  Resp: 18  Temp: 97.6 F (36.4 C)  SpO2: 100%       Filed Weights   04/05/24 0816  Weight: 81 lb 4.8 oz (36.9 kg)     Physical Exam Vitals and nursing note reviewed.  HENT:     Head: Normocephalic and atraumatic.     Mouth/Throat:     Pharynx: Oropharynx is clear.  Eyes:     Extraocular Movements: Extraocular movements intact.     Pupils: Pupils are equal, round, and reactive to light.  Cardiovascular:     Rate and Rhythm: Normal rate and regular rhythm.  Pulmonary:     Comments: Decreased breath sounds bilaterally.  Abdominal:     Palpations: Abdomen is soft.  Musculoskeletal:        General: Normal range of motion.     Cervical back: Normal range of motion.  Skin:    General: Skin is warm.  Neurological:     General: No focal deficit present.     Mental Status: She is alert and oriented to person, place, and time.  Psychiatric:        Behavior: Behavior normal.        Judgment: Judgment normal.      LABORATORY DATA:  I have reviewed the data as listed Lab Results  Component Value  Date   WBC 5.3 04/05/2024   HGB 11.6 (L) 04/05/2024   HCT 34.0 (L) 04/05/2024   MCV 90.7 04/05/2024   PLT 398 04/05/2024   Recent Labs    03/15/24 0816 03/22/24 0836 03/29/24 0825  NA 137 137 134*  K 4.0 4.1 4.0  CL 103 105 104  CO2 24 25 23   GLUCOSE 99 98 103*  BUN 20 18 13   CREATININE 0.46 0.50 0.48  CALCIUM 9.3 9.1 8.8*  GFRNONAA >60 >60 >60  PROT 7.5 7.2 7.2  ALBUMIN 3.5 3.4* 3.3*  AST 20 18 16   ALT 13 14 12   ALKPHOS 78 70 71  BILITOT 0.4 0.5 0.4     RADIOGRAPHIC STUDIES: I have personally reviewed the radiological images as listed and  agreed with the findings in the report. No results found.   ASSESSMENT & PLAN:   Primary cancer of right lower lobe of lung (HCC) # AUG 2025-[ [facility]SP] Dr. Tamea. POSITIVE FOR MALIGNANCY- SQUAMOUS CELL CARCINOMA- CT RIGHT lower lobe, 6.4 cm greatest   PET scan:  Hypermetabolic right lower lobe mass and probable right hilar adenopathy, findings consistent with T2 be N1 M0 or stage II B primary bronchogenic carcinoma.  AUG 2025-  brain MRI-NEG. Treatment: Definitive weekly carbotaxol with the daily radiation. [Start- 03/15/24- 04/29/2024]  # Proceed with cycle #4  of planned- # 7 of CarboTaxol weekly [until 10/20]. Labs-CBC/chemistries were reviewed with the patient. Will recommend imaging CT in Nov last week- will follow up with MD in 1st week of Dec- to review CT- and Proceed with IMFINZI q 4 weeks.   # Neck pain- x1 week- likely MSK- on Tylenol  not improved- recommend topical NSAIDs-/biofreeze- voltaren gel.   # Difficulty sleeping sec to steroids- dose steroid decreased- Improved.   # constipation- G-1-2- metamucil- dulclax-miralax prn-  improved   # APRIL 2024-left breast stage I high risk- INVASIVE MAMMARY CARCINOMA, s/p mastectomy [Dr.Rodenberg];2006-  s/p chemo;  bilateral mastectomy - on adjuvant anastrozole  x10  years-.-Stable.   # Bone health- recommend BMD; but declines- recommend calcium 1200 mg/day;OFF vit D- high dose vit D- -[June 2025]- HIGH will rechcek vit D levels.   # active smoker: < 1ppd; recommend quitting-   # IV ACCESS: s/p port placement.   weekly-  labs/port- MD  chemo- x 3 at  time- 10/6- NO MD PS-  # DISPOSITION: # chemo today-  # as per IS- weekly-  labs/port-   # on 10/13- labs- chemo-APP-- #  CT scan in last week- of NOV- ordered  # Follow up in 1st week of dec 2025- with MD; labs/port- IMFINZI [Lauren will sign the orders at next visit-]Dr.B     All questions were answered. The patient/family knows to call the clinic with any problems, questions or  concerns.    Cindy JONELLE Joe, MD 04/05/2024 9:13 AM

## 2024-04-05 NOTE — Progress Notes (Signed)
 C/o neck pain and unable to turn it, this is new for her, pain 5/10.  No appetite, boost bid.  Would like to discuss having a tooth pulled.

## 2024-04-05 NOTE — Assessment & Plan Note (Addendum)
#   AUG 2025-[ LCSP] Dr. Tamea. POSITIVE FOR MALIGNANCY- SQUAMOUS CELL CARCINOMA- CT RIGHT lower lobe, 6.4 cm greatest   PET scan:  Hypermetabolic right lower lobe mass and probable right hilar adenopathy, findings consistent with T2 be N1 M0 or stage II B primary bronchogenic carcinoma.  AUG 2025-  brain MRI-NEG. Treatment: Definitive weekly carbotaxol with the daily radiation. [Start- 03/15/24- 04/29/2024]  # Proceed with cycle #4  of planned- # 7 of CarboTaxol weekly [until 10/20]. Labs-CBC/chemistries were reviewed with the patient. Will recommend imaging CT in Nov last week- will follow up with MD in 1st week of Dec- to review CT- and Proceed with IMFINZI q 4 weeks.   # Neck pain- x1 week- likely MSK- on Tylenol  not improved- recommend topical NSAIDs-/biofreeze- voltaren gel.   # Difficulty sleeping sec to steroids- dose steroid decreased- Improved.   # constipation- G-1-2- metamucil- dulclax-miralax prn-  improved   # APRIL 2024-left breast stage I high risk- INVASIVE MAMMARY CARCINOMA, s/p mastectomy [Dr.Rodenberg];2006-  s/p chemo;  bilateral mastectomy - on adjuvant anastrozole  x10  years-.-Stable.   # Bone health- recommend BMD; but declines- recommend calcium 1200 mg/day;OFF vit D- high dose vit D- -[June 2025]- HIGH will rechcek vit D levels.   # active smoker: < 1ppd; recommend quitting-   # IV ACCESS: s/p port placement.   weekly-  labs/port- MD  chemo- x 3 at  time- 10/6- NO MD PS-  # DISPOSITION: # chemo today-  # as per IS- weekly-  labs/port-   # on 10/13- labs- chemo-APP-- #  CT scan in last week- of NOV- ordered  # Follow up in 1st week of dec 2025- with MD; labs/port- IMFINZI [Lauren will sign the orders at next visit-]Dr.B

## 2024-04-06 ENCOUNTER — Ambulatory Visit
Admission: RE | Admit: 2024-04-06 | Discharge: 2024-04-06 | Disposition: A | Source: Ambulatory Visit | Attending: Radiation Oncology | Admitting: Radiation Oncology

## 2024-04-06 ENCOUNTER — Other Ambulatory Visit: Payer: Self-pay

## 2024-04-06 DIAGNOSIS — C3431 Malignant neoplasm of lower lobe, right bronchus or lung: Secondary | ICD-10-CM | POA: Diagnosis not present

## 2024-04-06 LAB — RAD ONC ARIA SESSION SUMMARY
Course Elapsed Days: 21
Plan Fractions Treated to Date: 16
Plan Prescribed Dose Per Fraction: 2 Gy
Plan Total Fractions Prescribed: 33
Plan Total Prescribed Dose: 66 Gy
Reference Point Dosage Given to Date: 32 Gy
Reference Point Session Dosage Given: 2 Gy
Session Number: 16

## 2024-04-07 ENCOUNTER — Ambulatory Visit
Admission: RE | Admit: 2024-04-07 | Discharge: 2024-04-07 | Disposition: A | Discharge status: Home / Self Care | Source: Ambulatory Visit | Attending: Radiation Oncology | Admitting: Radiation Oncology

## 2024-04-07 ENCOUNTER — Other Ambulatory Visit: Payer: Self-pay

## 2024-04-07 DIAGNOSIS — C3431 Malignant neoplasm of lower lobe, right bronchus or lung: Secondary | ICD-10-CM | POA: Insufficient documentation

## 2024-04-07 LAB — RAD ONC ARIA SESSION SUMMARY
Course Elapsed Days: 22
Plan Fractions Treated to Date: 17
Plan Prescribed Dose Per Fraction: 2 Gy
Plan Total Fractions Prescribed: 33
Plan Total Prescribed Dose: 66 Gy
Reference Point Dosage Given to Date: 34 Gy
Reference Point Session Dosage Given: 2 Gy
Session Number: 17

## 2024-04-08 ENCOUNTER — Other Ambulatory Visit: Payer: Self-pay

## 2024-04-08 ENCOUNTER — Inpatient Hospital Stay

## 2024-04-08 ENCOUNTER — Ambulatory Visit
Admission: RE | Admit: 2024-04-08 | Discharge: 2024-04-08 | Disposition: A | Discharge status: Home / Self Care | Source: Ambulatory Visit | Attending: Radiation Oncology | Admitting: Radiation Oncology

## 2024-04-08 DIAGNOSIS — C3431 Malignant neoplasm of lower lobe, right bronchus or lung: Secondary | ICD-10-CM | POA: Diagnosis not present

## 2024-04-08 LAB — RAD ONC ARIA SESSION SUMMARY
Course Elapsed Days: 23
Plan Fractions Treated to Date: 18
Plan Prescribed Dose Per Fraction: 2 Gy
Plan Total Fractions Prescribed: 33
Plan Total Prescribed Dose: 66 Gy
Reference Point Dosage Given to Date: 36 Gy
Reference Point Session Dosage Given: 2 Gy
Session Number: 18

## 2024-04-08 NOTE — Progress Notes (Signed)
 Nutrition  Received a message from radiation RN that patient had something else to take care of today after radiation and would reach out to nutrition when needed.  Appointment cancelled  RD available if needed  Ilka Lovick B. Dasie SOLON, CSO, LDN Registered Dietitian [phone]

## 2024-04-09 ENCOUNTER — Ambulatory Visit
Admission: RE | Admit: 2024-04-09 | Discharge: 2024-04-09 | Disposition: A | Discharge status: Home / Self Care | Source: Ambulatory Visit | Attending: Radiation Oncology | Admitting: Radiation Oncology

## 2024-04-09 ENCOUNTER — Other Ambulatory Visit: Payer: Self-pay

## 2024-04-09 DIAGNOSIS — C3431 Malignant neoplasm of lower lobe, right bronchus or lung: Secondary | ICD-10-CM | POA: Diagnosis not present

## 2024-04-09 LAB — RAD ONC ARIA SESSION SUMMARY
Course Elapsed Days: 24
Plan Fractions Treated to Date: 19
Plan Prescribed Dose Per Fraction: 2 Gy
Plan Total Fractions Prescribed: 33
Plan Total Prescribed Dose: 66 Gy
Reference Point Dosage Given to Date: 38 Gy
Reference Point Session Dosage Given: 2 Gy
Session Number: 19

## 2024-04-12 ENCOUNTER — Other Ambulatory Visit: Payer: Self-pay | Admitting: Internal Medicine

## 2024-04-12 ENCOUNTER — Inpatient Hospital Stay

## 2024-04-12 ENCOUNTER — Encounter: Payer: Self-pay | Admitting: *Deleted

## 2024-04-12 ENCOUNTER — Ambulatory Visit
Admission: RE | Admit: 2024-04-12 | Discharge: 2024-04-12 | Disposition: A | Source: Ambulatory Visit | Attending: Radiation Oncology | Admitting: Radiation Oncology

## 2024-04-12 ENCOUNTER — Inpatient Hospital Stay: Attending: Internal Medicine

## 2024-04-12 ENCOUNTER — Other Ambulatory Visit: Payer: Self-pay

## 2024-04-12 ENCOUNTER — Encounter: Payer: Self-pay | Admitting: Oncology

## 2024-04-12 ENCOUNTER — Inpatient Hospital Stay (HOSPITAL_BASED_OUTPATIENT_CLINIC_OR_DEPARTMENT_OTHER): Admitting: Oncology

## 2024-04-12 VITALS — BP 129/63 | HR 83 | Temp 97.6°F | Resp 18 | Wt 80.2 lb

## 2024-04-12 VITALS — BP 123/73 | HR 78 | Temp 97.6°F | Resp 18 | Ht 61.0 in | Wt 80.0 lb

## 2024-04-12 DIAGNOSIS — R21 Rash and other nonspecific skin eruption: Secondary | ICD-10-CM | POA: Insufficient documentation

## 2024-04-12 DIAGNOSIS — Z9013 Acquired absence of bilateral breasts and nipples: Secondary | ICD-10-CM | POA: Diagnosis not present

## 2024-04-12 DIAGNOSIS — K047 Periapical abscess without sinus: Secondary | ICD-10-CM | POA: Insufficient documentation

## 2024-04-12 DIAGNOSIS — K0889 Other specified disorders of teeth and supporting structures: Secondary | ICD-10-CM | POA: Diagnosis not present

## 2024-04-12 DIAGNOSIS — C50412 Malignant neoplasm of upper-outer quadrant of left female breast: Secondary | ICD-10-CM | POA: Diagnosis present

## 2024-04-12 DIAGNOSIS — Z170 Estrogen receptor positive status [ER+]: Secondary | ICD-10-CM | POA: Diagnosis not present

## 2024-04-12 DIAGNOSIS — C3431 Malignant neoplasm of lower lobe, right bronchus or lung: Secondary | ICD-10-CM | POA: Insufficient documentation

## 2024-04-12 DIAGNOSIS — F17210 Nicotine dependence, cigarettes, uncomplicated: Secondary | ICD-10-CM | POA: Insufficient documentation

## 2024-04-12 DIAGNOSIS — Z79811 Long term (current) use of aromatase inhibitors: Secondary | ICD-10-CM | POA: Diagnosis not present

## 2024-04-12 DIAGNOSIS — Z5111 Encounter for antineoplastic chemotherapy: Secondary | ICD-10-CM | POA: Diagnosis present

## 2024-04-12 DIAGNOSIS — D709 Neutropenia, unspecified: Secondary | ICD-10-CM | POA: Insufficient documentation

## 2024-04-12 LAB — RAD ONC ARIA SESSION SUMMARY
Course Elapsed Days: 27
Plan Fractions Treated to Date: 20
Plan Prescribed Dose Per Fraction: 2 Gy
Plan Total Fractions Prescribed: 33
Plan Total Prescribed Dose: 66 Gy
Reference Point Dosage Given to Date: 40 Gy
Reference Point Session Dosage Given: 2 Gy
Session Number: 20

## 2024-04-12 LAB — CBC WITH DIFFERENTIAL (CANCER CENTER ONLY)
Abs Immature Granulocytes: 0.03 K/uL (ref 0.00–0.07)
Basophils Absolute: 0 K/uL (ref 0.0–0.1)
Basophils Relative: 1 %
Eosinophils Absolute: 0.1 K/uL (ref 0.0–0.5)
Eosinophils Relative: 2 %
HCT: 34.8 % — ABNORMAL LOW (ref 36.0–46.0)
Hemoglobin: 11.8 g/dL — ABNORMAL LOW (ref 12.0–15.0)
Immature Granulocytes: 1 %
Lymphocytes Relative: 11 %
Lymphs Abs: 0.4 K/uL — ABNORMAL LOW (ref 0.7–4.0)
MCH: 30.8 pg (ref 26.0–34.0)
MCHC: 33.9 g/dL (ref 30.0–36.0)
MCV: 90.9 fL (ref 80.0–100.0)
Monocytes Absolute: 0.5 K/uL (ref 0.1–1.0)
Monocytes Relative: 14 %
Neutro Abs: 2.7 K/uL (ref 1.7–7.7)
Neutrophils Relative %: 71 %
Platelet Count: 340 K/uL (ref 150–400)
RBC: 3.83 MIL/uL — ABNORMAL LOW (ref 3.87–5.11)
RDW: 14.4 % (ref 11.5–15.5)
WBC Count: 3.7 K/uL — ABNORMAL LOW (ref 4.0–10.5)
nRBC: 0 % (ref 0.0–0.2)

## 2024-04-12 LAB — CMP (CANCER CENTER ONLY)
ALT: 16 U/L (ref 0–44)
AST: 21 U/L (ref 15–41)
Albumin: 3.4 g/dL — ABNORMAL LOW (ref 3.5–5.0)
Alkaline Phosphatase: 70 U/L (ref 38–126)
Anion gap: 6 (ref 5–15)
BUN: 18 mg/dL (ref 8–23)
CO2: 25 mmol/L (ref 22–32)
Calcium: 9.2 mg/dL (ref 8.9–10.3)
Chloride: 105 mmol/L (ref 98–111)
Creatinine: 0.45 mg/dL (ref 0.44–1.00)
GFR, Estimated: >60 mL/min (ref >60)
Glucose, Bld: 104 mg/dL — ABNORMAL HIGH (ref 70–99)
Potassium: 4.3 mmol/L (ref 3.5–5.1)
Sodium: 136 mmol/L (ref 135–145)
Total Bilirubin: 0.3 mg/dL (ref 0.0–1.2)
Total Protein: 7.4 g/dL (ref 6.5–8.1)

## 2024-04-12 MED ORDER — DIPHENHYDRAMINE HCL 50 MG/ML IJ SOLN
25.0000 mg | Freq: Once | INTRAMUSCULAR | Status: AC
  Administered 2024-04-12: 25 mg via INTRAVENOUS
  Filled 2024-04-12: qty 1

## 2024-04-12 MED ORDER — SODIUM CHLORIDE 0.9 % IV SOLN
45.0000 mg/m2 | Freq: Once | INTRAVENOUS | Status: AC
  Administered 2024-04-12: 54 mg via INTRAVENOUS
  Filled 2024-04-12: qty 9

## 2024-04-12 MED ORDER — AMOXICILLIN 875 MG PO TABS: 875.0000 mg | ORAL_TABLET | Freq: Two times a day (BID) | ORAL | 0 refills | Status: DC

## 2024-04-12 MED ORDER — FAMOTIDINE IN NACL 20-0.9 MG/50ML-% IV SOLN
20.0000 mg | Freq: Once | INTRAVENOUS | Status: AC
  Administered 2024-04-12: 20 mg via INTRAVENOUS
  Filled 2024-04-12: qty 50

## 2024-04-12 MED ORDER — PALONOSETRON HCL INJECTION 0.25 MG/5ML
0.2500 mg | Freq: Once | INTRAVENOUS | Status: AC
  Administered 2024-04-12: 0.25 mg via INTRAVENOUS
  Filled 2024-04-12: qty 5

## 2024-04-12 MED ORDER — DEXAMETHASONE SODIUM PHOSPHATE 10 MG/ML IJ SOLN
4.0000 mg | Freq: Once | INTRAMUSCULAR | Status: AC
  Administered 2024-04-12: 4 mg via INTRAVENOUS
  Filled 2024-04-12: qty 1

## 2024-04-12 MED ORDER — SODIUM CHLORIDE 0.9 % IV SOLN
108.6000 mg | Freq: Once | INTRAVENOUS | Status: AC
  Administered 2024-04-12: 110 mg via INTRAVENOUS
  Filled 2024-04-12: qty 11

## 2024-04-12 MED ORDER — SODIUM CHLORIDE 0.9 % IV SOLN
INTRAVENOUS | Status: DC
  Filled 2024-04-12: qty 250

## 2024-04-12 MED ORDER — TRIAMCINOLONE ACETONIDE 0.5 % EX OINT: 1.0000 | TOPICAL_OINTMENT | Freq: Two times a day (BID) | CUTANEOUS | 0 refills | Status: AC

## 2024-04-12 NOTE — Assessment & Plan Note (Addendum)
 Given there is no pain today, we will proceed with chemo.  She only has 1 additional chemo after today. Reviewed labs which are acceptable for treatment.  Mild neutropenia but ANC is greater than 2. Discussed with Dr. Rennie starting her on antibiotic to see if this helps get her through her last 2 treatments. If she is unable to wait, recommend lab work prior to getting her tooth pulled. Continue OTC treatment as she has been doing with Tylenol  and numbing drops.

## 2024-04-12 NOTE — Progress Notes (Signed)
 Patient's dentist- is [facility]. Patient has a cavity in lower back molar and she wants a tooth extraction. She also c/o an episode of acute right lower back pain. She took took tylenol  this morning which eased off the pain.

## 2024-04-12 NOTE — Assessment & Plan Note (Addendum)
 Unclear etiology but looks like an allergic reaction. Recommend topical cream.  Will send in Kenalog cream for her.  She also will be receiving some steroids and Benadryl . Monitor rash for now.

## 2024-04-12 NOTE — Assessment & Plan Note (Signed)
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  Patiet declined Chemo.  Hx of Right mastectomy in 2006- s/p BIL MASTECTOMY- NO Mammograms.  # Patient currently on anastrozole .  Recommend anastrozole  for 10 years. Tolerating well.  Stable.   # Bone health- recommend BMD; but declines- recommend calcium 1200 mg/day; along with high dose vit D.   # active smoker: < 1ppd; recommend quitting-   # DISPOSITION: # follow up in 6 months- MD; labs- cbc/cmp; vit D 25-OH Dr. Geraldene Kleine

## 2024-04-12 NOTE — Progress Notes (Signed)
 Pt presents to clinic with a rash on right flank, back pain that started yesterday and the concerns or a tooth that needs to be pulled and would like Benadryl  dose decreased due to having agitation/restlessness last treatment.  MD made aware and pt to be seen by Symptom Management.   Delon Hope NP at chairside. Okay to proceed with treatment and will discuss plan with MD.

## 2024-04-12 NOTE — Assessment & Plan Note (Addendum)
 Proceed with cycle 6 out of 7 carbo/Taxol . Labs from 04/12/2024 showed mild neutropenia white count of 3.7 with ANC of 2.7. Discussed letting us  know if she needs to be seen or have her tooth removed prior to her next chemotherapy. Will go ahead and start her on antibiotics to see if this helps get her through the last 2 cycles.

## 2024-04-12 NOTE — Progress Notes (Signed)
 Per patient request we will decrease the dose of Benadryl ; evaluate for the rash/need for dental extraction- evaluation with NP today.

## 2024-04-12 NOTE — Progress Notes (Signed)
 [facility] OFFICE PROGRESS NOTE  Odell Chard, Edra GRADE, MD  ASSESSMENT & PLAN:    Assessment & Plan Primary cancer of right lower lobe of lung (HCC) Proceed with cycle 6 out of 7 carbo/Taxol . Labs from 04/12/2024 showed mild neutropenia white count of 3.7 with ANC of 2.7. Discussed letting us  know if she needs to be seen or have her tooth removed prior to her next chemotherapy. Will go ahead and start her on antibiotics to see if this helps get her through the last 2 cycles. Tooth pain Given there is no pain today, we will proceed with chemo.  She only has 1 additional chemo after today. Reviewed labs which are acceptable for treatment.  Mild neutropenia but ANC is greater than 2. Discussed with Dr. Rennie starting her on antibiotic to see if this helps get her through her last 2 treatments. If she is unable to wait, recommend lab work prior to getting her tooth pulled. Continue OTC treatment as she has been doing with Tylenol  and numbing drops. Rash Unclear etiology but looks like an allergic reaction. Recommend topical cream.  Will send in Kenalog cream for her.  She also will be receiving some steroids and Benadryl . Monitor rash for now.  No orders of the defined types were placed in this encounter.   INTERVAL HISTORY: She reports she has been struggling with a infected tooth for several weeks now.  Reports when the tooth becomes irritated it is very painful.  Reports she is in close contact with her dentist and he has recommended it be pulled as soon as possible.  Reports today, there is no pain with her tooth but she is worried that it will start acting up in the next few days.  She has been using a topical drop on her tooth which helps to numb it.  She also will occasionally use Tylenol  or ibuprofen for pain control.  She also noticed a rash to her right side below her right breast over the pasr 3-4 days.  It is not painful and would never of noticed it  had she not seen in the mirror.  Occasionally it will itch.  She has not tried anything on it topically.    We reviewed CBC, CMP.  SUMMARY OF HEMATOLOGIC HISTORY: Oncology History Overview Note  BRCA negative 2013    Breast cancer (HCC) 2005    left breast ca   Breast cancer (HCC) 2006, 1999    right breast ca   Cancer [facility]) [phone]   # Left breast- 1999 mild ducts- s/p lumpectomy; s/p RT- no chemo [Dr.Sankar/Dr.Byrnett]  # 2006 ? Stage- s/p right mastectomy s/p chemo [Dr.Choksi]; Dr.Chrystal     DIAGNOSIS: A.  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 Size of invasive carcinoma: 2 mm Mm in this sample Histologic grade of invasive carcinoma: Grade 2                      Glandular/tubular differentiation score: 3                      Nuclear pleomorphism score: 3                      Mitotic rate score: 1                      Total  score: 7 Ductal carcinoma in situ: Present, high-grade comedo type with associated calcifications Lymphovascular invasion: Not identified  Comment: The definitive grade will be assigned on the excisional specimen. ER/PR/HER2: Immunohistochemistry will be performed on block A2, with reflex to FISH for HER2 2+. The results will be reported in an addendum.  GROSS DESCRIPTION: A. Labeled: Left breast stereo biopsy calcs upper outer quadrant mid depth Received: in a formalin-filled [employer] collection device Specimen radiograph image(s) available for review Time/Date in fixative: Collected at 10:58 AM on 10/23/2022 and placed in formalin at 11:01 AM on 10/23/2022 Cold ischemic time: Less than 5 minutes Total fixation time: Approximately 9.25 hours Core pieces: Multiple Measurement: Aggregate, 8.5 x 1.5 x 0.3 cm Description / comments: Received are cores and fragments of yellow fibrofatty tissue.  The accompanying diagram has sections B and G checked. Inked:  Green Entirely submitted in cassette(s):  1 - section B 2 - section G 3 - 7 - remaining tissue fragments      3 - sections A and C      4 - sections D and E      5 - sections F and H      6 - sections I and J      7 - sections K and L with remaining free-floating fragments  RB 10/23/2022  Final Diagnosis performed by Rexene Daily, MD.   Electronically signed 10/24/2022 10:40:25AM The electronic signature indicates that the named Attending Pathologist has evaluated the specimen Technical component performed at [facility], [address], [place], [place] [address] Lab: [phone] Dir: Frankey Sas, MD, MMM  Professional component performed at [facility], [facility], [address] [place], [place], [place] [address] Lab: [phone] Dir: Wilkie EMERSON Molt, MD  ADDENDUM: CASE SUMMARY: BREAST BIOMARKER TESTS Estrogen Receptor (ER) Status: POSITIVE         Percentage of cells with nuclear positivity: Greater than 90%         Average intensity of staining: Strong  Progesterone Receptor (PgR) Status: NEGATIVE (LESS THAN 1%)         Internal control cells present and stain as expected  HER2 (by immunohistochemistry): NEGATIVE (Score 1+)  Histologic Type: Invasive carcinoma of no special type (ductal) Histologic Grade (Not[place] Histologic Score)      Glandular (Acinar)/Tubular Differentiation: 3      Nuclear Pleomorphism: 3      Mitotic Rate: 3      Overall Grade: Grade 3 Tumor Size: At least 17 mm Tumor Focality: Single focus of invasive carcinoma Ductal Carcinoma In Situ (DCIS): Present      Positive for extensive intraductal component (EIC) Tumor Extent: Not applicable Lymphatic and/or Vascular Invasion: Not identified  Treatment Effect in the Breast: No known presurgical therapy  MARGINS Margin Status for Invasive Carcinoma: All margins negative for invasive carcinoma   APRIL/MAY 2024-left breast invasive mammary carcinoma s/p mastectomy [Dr. Rodenberg]-at  least pT1c [17 mm]; 2 sentinel lymph nodes negative; grade 3; Oncotype 35 high risk-declined chemotherapy.   # # AUG 2025-[ [facility]] Dr. Tamea. POSITIVE FOR MALIGNANCY- SQUAMOUS CELL CARCINOMA- CT RIGHT lower lobe, 6.4 cm greatest   PET scan:  Hypermetabolic right lower lobe mass and probable right hilar adenopathy, findings consistent with T2 be N1 M0 or stage II B primary bronchogenic carcinoma.  AUG brain MRI-NEG.   # Treatment: Definitive weekly carbotaxol with the daily radiation. [Start- 03/15/24]       Carcinoma of upper-outer quadrant of left breast in female, estrogen receptor positive (HCC)  10/30/2022 Initial Diagnosis   Carcinoma of upper-outer quadrant of left breast in female, estrogen receptor positive   10/30/2022 Cancer Staging   Staging form: Breast, AJCC 8th Edition - Clinical: Stage IA (cT1a, cN0, cM0, G2, ER+, PR-, HER2-) - Signed by Rennie Cindy SAUNDERS, MD on 10/30/2022 Histologic grading system: 3 grade system   12/05/2022 - 12/05/2022 Chemotherapy   Patient is on Treatment Plan : BREAST TC q21d     Primary cancer of right lower lobe of lung (HCC)  02/23/2024 Initial Diagnosis   Primary cancer of right lower lobe of lung (HCC)   02/23/2024 Cancer Staging   Staging form: Lung, AJCC V9 - Clinical: Stage IIB (cT2b, cN1, cM0) - Signed by Rennie Cindy SAUNDERS, MD on 02/23/2024   03/15/2024 -  Chemotherapy   Patient is on Treatment Plan : LUNG Carboplatin  + Paclitaxel  + XRT q7d        CBC    Component Value Date/Time   WBC 3.7 (L) 04/12/2024 0822   WBC 10.7 (H) 07/18/2021 1418   RBC 3.83 (L) 04/12/2024 0822   HGB 11.8 (L) 04/12/2024 0822   HCT 34.8 (L) 04/12/2024 0822   PLT 340 04/12/2024 0822   MCV 90.9 04/12/2024 0822   MCH 30.8 04/12/2024 0822   MCHC 33.9 04/12/2024 0822   RDW 14.4 04/12/2024 0822   LYMPHSABS 0.4 (L) 04/12/2024 0822   MONOABS 0.5 04/12/2024 0822   EOSABS 0.1 04/12/2024 0822   BASOSABS 0.0 04/12/2024 0822       Latest Ref Rng & Units  04/12/2024    8:22 AM 04/05/2024    8:19 AM 03/29/2024    8:25 AM  CMP  Glucose 70 - 99 mg/dL 895  893  896   BUN 8 - 23 mg/dL 18  14  13    Creatinine 0.44 - 1.00 mg/dL 9.54  9.48  9.51   Sodium 135 - 145 mmol/L 136  136  134   Potassium 3.5 - 5.1 mmol/L 4.3  4.3  4.0   Chloride 98 - 111 mmol/L 105  104  104   CO2 22 - 32 mmol/L 25  24  23    Calcium 8.9 - 10.3 mg/dL 9.2  9.3  8.8   Total Protein 6.5 - 8.1 g/dL 7.4  6.9  7.2   Total Bilirubin 0.0 - 1.2 mg/dL 0.3  0.5  0.4   Alkaline Phos 38 - 126 U/L 70  75  71   AST 15 - 41 U/L 21  18  16    ALT 0 - 44 U/L 16  15  12       No results found for: FERRITIN, VITAMINB12  Vitals:   04/12/24 0836  BP: 123/73  Pulse: 78  Resp: 18  Temp: 97.6 F (36.4 C)  SpO2: 100%    Review of System:  Review of Systems  HENT:         Tooth pain   Skin:  Positive for itching and rash.       Right lower abdomen    Physical Exam: Physical Exam Constitutional:      Appearance: Normal appearance.  HENT:     Head: Normocephalic and atraumatic.  Eyes:     Pupils: Pupils are equal, round, and reactive to light.  Cardiovascular:     Rate and Rhythm: Normal rate and regular rhythm.     Heart sounds: Normal heart sounds. No murmur heard. Pulmonary:     Effort: Pulmonary effort is normal.  Breath sounds: Normal breath sounds. No wheezing.  Abdominal:     General: Bowel sounds are normal. There is no distension.     Palpations: Abdomen is soft.     Tenderness: There is no abdominal tenderness.  Musculoskeletal:        General: Normal range of motion.     Cervical back: Normal range of motion.  Skin:    General: Skin is warm and dry.     Findings: Rash present. Rash is urticarial.     Comments: Right upper torso and one spot to right lower quadrant. No warmth but mild erythema.  Nonpainful.  Neurological:     Mental Status: She is alert and oriented to person, place, and time.     Gait: Gait is intact.  Psychiatric:        Mood and  Affect: Mood and affect normal.        Cognition and Memory: Memory normal.        Judgment: Judgment normal.      I spent 30 minutes dedicated to the care of this patient (face-to-face and non-face-to-face) on the date of the encounter to include what is described in the assessment and plan.,  Delon Hope, NP 04/12/2024 10:24 AM

## 2024-04-13 ENCOUNTER — Telehealth: Payer: Self-pay | Admitting: Oncology

## 2024-04-13 ENCOUNTER — Ambulatory Visit
Admission: RE | Admit: 2024-04-13 | Discharge: 2024-04-13 | Disposition: A | Discharge status: Home / Self Care | Source: Ambulatory Visit | Attending: Radiation Oncology | Admitting: Radiation Oncology

## 2024-04-13 ENCOUNTER — Other Ambulatory Visit: Payer: Self-pay

## 2024-04-13 DIAGNOSIS — C3431 Malignant neoplasm of lower lobe, right bronchus or lung: Secondary | ICD-10-CM | POA: Diagnosis not present

## 2024-04-13 LAB — RAD ONC ARIA SESSION SUMMARY
Course Elapsed Days: 28
Plan Fractions Treated to Date: 21
Plan Prescribed Dose Per Fraction: 2 Gy
Plan Total Fractions Prescribed: 33
Plan Total Prescribed Dose: 66 Gy
Reference Point Dosage Given to Date: 42 Gy
Reference Point Session Dosage Given: 2 Gy
Session Number: 21

## 2024-04-13 NOTE — Telephone Encounter (Signed)
 Patient called to report coughing up mucus with streaks of blood.  Also noticed small amount of blood when she blows her nose. She has lung cancer, currently on concurrent chemotherapy and radiation. Recommend patient continue monitor symptoms.  Patient has normal platelet count.  Not on anticoagulation except low-dose aspirin.  Small amount of hemoptysis could be secondary to the irritation from radiation.  However if symptoms get worse, she should go to emergency room for further evaluation.  CC Dr. Rennie to follow-up  in AM.

## 2024-04-14 ENCOUNTER — Inpatient Hospital Stay: Attending: Internal Medicine | Admitting: Hospice and Palliative Medicine

## 2024-04-14 ENCOUNTER — Encounter: Payer: Self-pay | Admitting: Hospice and Palliative Medicine

## 2024-04-14 ENCOUNTER — Ambulatory Visit

## 2024-04-14 ENCOUNTER — Other Ambulatory Visit: Payer: Self-pay

## 2024-04-14 ENCOUNTER — Telehealth: Payer: Self-pay | Admitting: *Deleted

## 2024-04-14 VITALS — BP 127/71 | HR 109 | Temp 97.9°F | Resp 20 | Ht 61.0 in | Wt 81.0 lb

## 2024-04-14 DIAGNOSIS — C3431 Malignant neoplasm of lower lobe, right bronchus or lung: Secondary | ICD-10-CM | POA: Diagnosis not present

## 2024-04-14 DIAGNOSIS — Z5111 Encounter for antineoplastic chemotherapy: Secondary | ICD-10-CM | POA: Diagnosis not present

## 2024-04-14 DIAGNOSIS — R042 Hemoptysis: Secondary | ICD-10-CM

## 2024-04-14 MED ORDER — TRAMADOL HCL 50 MG PO TABS: 50.0000 mg | ORAL_TABLET | Freq: Four times a day (QID) | ORAL | 0 refills | Status: AC | PRN

## 2024-04-14 NOTE — Telephone Encounter (Signed)
 Pt was evaluated in [facility] today.

## 2024-04-14 NOTE — Telephone Encounter (Signed)
 Patient I called to her and she says that she called 930 last night about blood when she coughs.  They it is a little bit better but she still coughing and when she coughs she has blood.  I told her that I would have Dr. Damaris team to see what we are going to do.  He states that she supposed to be over here for radiation at 10:45: I spoke to [place] and Rosaline has said that she has already talked to [facility] and they have been trying to get in touch with her but no one has got her yet. She said that she got a call at 8:30 and said they will call back.

## 2024-04-14 NOTE — Telephone Encounter (Signed)
 The patient says that she is getting radiation and they did tell her that she could have some bleeding with the radiation.  This morning she had just a little bit of coughing but it did have some blood in it and she wonders what she should do.  He is supposed to come in today 10:45

## 2024-04-14 NOTE — Progress Notes (Signed)
 Symptom Management Clinic [facility] at [facility] [phone] [phone]  Patient Care Team: Odell Chard, Edra GRADE, MD as PCP - General (Family Medicine) Dellie Louanne MATSU, MD (General Surgery) Cindie Jesusa HERO, RN as Registered Nurse Georgina Shasta POUR, RN as Oncology Nurse Navigator Rennie Cindy SAUNDERS, MD as Consulting Physician (Oncology) Verdene Gills, RN as Oncology Nurse Navigator   NAME OF PATIENT: Gabriella Green  [id number]  1950-10-10   DATE OF VISIT: 04/14/24  REASON FOR CONSULT: Gabriella Green is a 73 y.o. female with multiple medical problems including stage IIb primary bronchogenic carcinoma on weekly carbo Taxol  and concurrent XRT.  Patient also with stage I left breast cancer status post mastectomy.  On adjuvant anastrozole .  INTERVAL HISTORY: Patient was seen by Randall Hope, NP on 04/12/2024 for complaint of tooth pain.  She had mild neutropenia and started on amoxicillin.  Patient also had a rash and started on Kenalog cream.  Patient called on-call provider early morning of 04/14/2024 with complaint of hemoptysis.  Patient was advised to go to the emergency department.  Patient presents to [facility] today for evaluation of hemoptysis.  She says she noticed some frank red blood in small quantities but this tapered and has resolved as of this morning.  She denies chest pain or shortness of breath.  Patient does endorse some back pain.  Denies any neurologic complaints. Denies recent fevers or illnesses. Denies any easy bleeding or bruising. Reports good appetite and denies weight loss. Denies chest pain. Denies any nausea, vomiting, constipation, or diarrhea. Denies urinary complaints. Patient offers no further specific complaints today.   PAST MEDICAL HISTORY: Past Medical History:  Diagnosis Date   BRCA negative 2013   Breast cancer (HCC) 2005   left breast ca   Breast cancer (HCC) 2006, 1999   right breast ca   COPD  (chronic obstructive pulmonary disease) (HCC)    Mass of right lung    Multiple lung nodules    Personal history of chemotherapy    2006 rt breast ca   Personal history of radiation therapy    PONV (postoperative nausea and vomiting)    RBBB (right bundle branch block)     PAST SURGICAL HISTORY:  Past Surgical History:  Procedure Laterality Date   BREAST BIOPSY Left 2019    ADENOSIS, SKELETAL MUSCLE of LEFT breast   BREAST BIOPSY Left 10/23/2022   Left Breast Stereo Bx, X clip - path pending   BREAST BIOPSY Left 10/23/2022   MM LT BREAST BX W LOC DEV 1ST LESION IMAGE BX SPEC STEREO GUIDE 10/23/2022 [facility]   BREAST CYST ASPIRATION Bilateral    BREAST EXCISIONAL BIOPSY Right 1999   rad breast ca   BREAST EXCISIONAL BIOPSY Left 2005   rad breast ca   BREAST EXCISIONAL BIOPSY Right 2006   mastectomy chemo    BREAST LUMPECTOMY Left 2005   1999 right lumpectomy   CHOLECYSTECTOMY  2002   COLONOSCOPY  2009   COLONOSCOPY WITH PROPOFOL  N/A 09/03/2018   Procedure: COLONOSCOPY WITH PROPOFOL ;  Surgeon: Unk Corinn Skiff, MD;  Location: [facility];  Service: Gastroenterology;  Laterality: N/A;   ENDOBRONCHIAL ULTRASOUND Bilateral 02/16/2024   Procedure: ENDOBRONCHIAL ULTRASOUND (EBUS);  Surgeon: Tamea Dedra CROME, MD;  Location: [facility];  Service: Pulmonary;  Laterality: Bilateral;   ESOPHAGOGASTRODUODENOSCOPY (EGD) WITH PROPOFOL  N/A 09/03/2018   Procedure: ESOPHAGOGASTRODUODENOSCOPY (EGD) WITH PROPOFOL ;  Surgeon: Unk Corinn Skiff, MD;  Location: [facility];  Service: Gastroenterology;  Laterality: N/A;  IR IMAGING GUIDED PORT INSERTION  03/04/2024   MASTECTOMY Right 2006   MASTECTOMY W/ SENTINEL NODE BIOPSY Left 11/13/2022   Procedure: MASTECTOMY WITH SENTINEL LYMPH NODE BIOPSY, total mastectomy;  Surgeon: Lane Shope, MD;  Location: [facility] ORS;  Service: General;  Laterality: Left;   VIDEO BRONCHOSCOPY WITH ENDOBRONCHIAL NAVIGATION Bilateral 02/16/2024   Procedure:  VIDEO BRONCHOSCOPY WITH ENDOBRONCHIAL NAVIGATION;  Surgeon: Tamea Dedra CROME, MD;  Location: [facility] ORS;  Service: Pulmonary;  Laterality: Bilateral;    HEMATOLOGY/ONCOLOGY HISTORY:  Oncology History Overview Note  BRCA negative 2013    Breast cancer (HCC) 2005    left breast ca   Breast cancer (HCC) 2006, 1999    right breast ca   Cancer [facility]) [phone]   # Left breast- 1999 mild ducts- s/p lumpectomy; s/p RT- no chemo [Dr.Sankar/Dr.Byrnett]  # 2006 ? Stage- s/p right mastectomy s/p chemo [Dr.Choksi]; Dr.Chrystal     DIAGNOSIS: A.  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 Size of invasive carcinoma: 2 mm Mm in this sample Histologic grade of invasive carcinoma: Grade 2                      Glandular/tubular differentiation score: 3                      Nuclear pleomorphism score: 3                      Mitotic rate score: 1                      Total score: 7 Ductal carcinoma in situ: Present, high-grade comedo type with associated calcifications Lymphovascular invasion: Not identified  Comment: The definitive grade will be assigned on the excisional specimen. ER/PR/HER2: Immunohistochemistry will be performed on block A2, with reflex to FISH for HER2 2+. The results will be reported in an addendum.  GROSS DESCRIPTION: A. Labeled: Left breast stereo biopsy calcs upper outer quadrant mid depth Received: in a formalin-filled Bre[place]a collection device Specimen radiograph image(s) available for review Time/Date in fixative: Collected at 10:58 AM on 10/23/2022 and placed in formalin at 11:01 AM on 10/23/2022 Cold ischemic time: Less than 5 minutes Total fixation time: Approximately 9.25 hours Core pieces: Multiple Measurement: Aggregate, 8.5 x 1.5 x 0.3 cm Description / comments: Received are cores and fragments of yellow fibrofatty tissue.  The accompanying diagram has sections B and  G checked. Inked: Green Entirely submitted in cassette(s):  1 - section B 2 - section G 3 - 7 - remaining tissue fragments      3 - sections A and C      4 - sections D and E      5 - sections F and H      6 - sections I and J      7 - sections K and L with remaining free-floating fragments  RB 10/23/2022  Final Diagnosis performed by Rexene Daily, MD.   Electronically signed 10/24/2022 10:40:25AM The electronic signature indicates that the named Attending Pathologist has evaluated the specimen Technical component performed at [facility], [address] [place], [place] [address] Lab: [phone] Dir: Frankey Sas, MD, MMM  Professional component performed at [facility], [facility], [address] [place], [place], [place] [address] Lab: [phone] Dir: Wilkie EMERSON Molt, MD  ADDENDUM: CASE SUMMARY: BREAST BIOMARKER TESTS Estrogen Receptor (ER) Status:  POSITIVE         Percentage of cells with nuclear positivity: Greater than 90%         Average intensity of staining: Strong  Progesterone Receptor (PgR) Status: NEGATIVE (LESS THAN 1%)         Internal control cells present and stain as expected  HER2 (by immunohistochemistry): NEGATIVE (Score 1+)  Histologic Type: Invasive carcinoma of no special type (ductal) Histologic Grade (Nottingham Histologic Score)      Glandular (Acinar)/Tubular Differentiation: 3      Nuclear Pleomorphism: 3      Mitotic Rate: 3      Overall Grade: Grade 3 Tumor Size: At least 17 mm Tumor Focality: Single focus of invasive carcinoma Ductal Carcinoma In Situ (DCIS): Present      Positive for extensive intraductal component (EIC) Tumor Extent: Not applicable Lymphatic and/or Vascular Invasion: Not identified  Treatment Effect in the Breast: No known presurgical therapy  MARGINS Margin Status for Invasive Carcinoma: All margins negative for invasive carcinoma   APRIL/MAY 2024-left breast invasive mammary carcinoma s/p mastectomy  [Dr. Rodenberg]-at least pT1c [17 mm]; 2 sentinel lymph nodes negative; grade 3; Oncotype 35 high risk-declined chemotherapy.   # # AUG 2025-[ [facility]] Dr. Tamea. POSITIVE FOR MALIGNANCY- SQUAMOUS CELL CARCINOMA- CT RIGHT lower lobe, 6.4 cm greatest   PET scan:  Hypermetabolic right lower lobe mass and probable right hilar adenopathy, findings consistent with T2 be N1 M0 or stage II B primary bronchogenic carcinoma.  AUG brain MRI-NEG.   # Treatment: Definitive weekly carbotaxol with the daily radiation. [Start- 03/15/24]       Carcinoma of upper-outer quadrant of left breast in female, estrogen receptor positive (HCC)  10/30/2022 Initial Diagnosis   Carcinoma of upper-outer quadrant of left breast in female, estrogen receptor positive   10/30/2022 Cancer Staging   Staging form: Breast, AJCC 8th Edition - Clinical: Stage IA (cT1a, cN0, cM0, G2, ER+, PR-, HER2-) - Signed by Rennie Cindy SAUNDERS, MD on 10/30/2022 Histologic grading system: 3 grade system   12/05/2022 - 12/05/2022 Chemotherapy   Patient is on Treatment Plan : BREAST TC q21d     Primary cancer of right lower lobe of lung (HCC)  02/23/2024 Initial Diagnosis   Primary cancer of right lower lobe of lung (HCC)   02/23/2024 Cancer Staging   Staging form: Lung, AJCC V9 - Clinical: Stage IIB (cT2b, cN1, cM0) - Signed by Rennie Cindy SAUNDERS, MD on 02/23/2024   03/15/2024 -  Chemotherapy   Patient is on Treatment Plan : LUNG Carboplatin  + Paclitaxel  + XRT q7d       ALLERGIES:  is allergic to codeine and tape.  MEDICATIONS:  Current Outpatient Medications  Medication Sig Dispense Refill   acetaminophen  (TYLENOL ) 500 MG tablet Take 500 mg by mouth every 6 (six) hours as needed for mild pain (pain score 1-3) or moderate pain (pain score 4-6).     amoxicillin (AMOXIL) 875 MG tablet Take 1 tablet (875 mg total) by mouth 2 (two) times daily. 20 tablet 0   anastrozole  (ARIMIDEX ) 1 MG tablet TAKE 1 TABLET(1 MG) BY MOUTH DAILY 90 tablet 1    aspirin EC 81 MG tablet Take 81 mg by mouth at bedtime. Swallow whole.     lidocaine -prilocaine  (EMLA ) cream Apply to affected area once 30 g 3   triamcinolone ointment (KENALOG) 0.5 % Apply 1 Application topically 2 (two) times daily. 30 g 0   omeprazole  (PRILOSEC) 40 MG capsule Take 1 capsule (40 mg  total) by mouth daily. (Patient not taking: Reported on 04/12/2024) 30 capsule 2   ondansetron  (ZOFRAN ) 8 MG tablet Take 1 tablet (8 mg total) by mouth every 8 (eight) hours as needed for nausea or vomiting. Start on the third day after chemotherapy. (Patient not taking: Reported on 04/14/2024) 30 tablet 1   ondansetron  (ZOFRAN -ODT) 4 MG disintegrating tablet Take 4 mg by mouth every 8 (eight) hours as needed. (Patient not taking: Reported on 04/14/2024)     prochlorperazine  (COMPAZINE ) 10 MG tablet Take 1 tablet (10 mg total) by mouth every 6 (six) hours as needed for nausea or vomiting. (Patient not taking: Reported on 04/14/2024) 30 tablet 1   No current facility-administered medications for this visit.    VITAL SIGNS: BP 127/71   Pulse (!) 109   Temp 97.9 F (36.6 C) (Tympanic)   Resp 20   Ht 5' 1 (1.549 m)   Wt 81 lb (36.7 kg)   SpO2 100%   BMI 15.30 kg/m  Filed Weights   04/14/24 1100  Weight: 81 lb (36.7 kg)    Estimated body mass index is 15.3 kg/m as calculated from the following:   Height as of this encounter: 5' 1 (1.549 m).   Weight as of this encounter: 81 lb (36.7 kg).  LABS: CBC:    Component Value Date/Time   WBC 3.7 (L) 04/12/2024 0822   WBC 10.7 (H) 07/18/2021 1418   HGB 11.8 (L) 04/12/2024 0822   HCT 34.8 (L) 04/12/2024 0822   PLT 340 04/12/2024 0822   MCV 90.9 04/12/2024 0822   NEUTROABS 2.7 04/12/2024 0822   LYMPHSABS 0.4 (L) 04/12/2024 0822   MONOABS 0.5 04/12/2024 0822   EOSABS 0.1 04/12/2024 0822   BASOSABS 0.0 04/12/2024 0822   Comprehensive Metabolic Panel:    Component Value Date/Time   NA 136 04/12/2024 0822   K 4.3 04/12/2024 0822   CL  105 04/12/2024 0822   CO2 25 04/12/2024 0822   BUN 18 04/12/2024 0822   CREATININE 0.45 04/12/2024 0822   GLUCOSE 104 (H) 04/12/2024 0822   CALCIUM 9.2 04/12/2024 0822   AST 21 04/12/2024 0822   ALT 16 04/12/2024 0822   ALKPHOS 70 04/12/2024 0822   BILITOT 0.3 04/12/2024 0822   PROT 7.4 04/12/2024 0822   ALBUMIN 3.4 (L) 04/12/2024 9177    RADIOGRAPHIC STUDIES: No results found.  PERFORMANCE STATUS (ECOG) : 1 - Symptomatic but completely ambulatory  Review of Systems Unless otherwise noted, a complete review of systems is negative.  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  Recommended that she use her already prescribed steroid cream.  She is having pain and discomfort and she says that acetaminophen  is ineffective.  Will give her a small quantity of tramadol to take as needed.  Hemoptysis -patient is not having chest pain or shortness of breath.  Hemoptysis has resolved as of this morning. Discussed with Dr. Rennie and PE is felt to be unlikely. More likely, this is due to effects from radiation.  Patient advised to hold aspirin.  Discussed ED triggers in detail and patient can proceed to ED for imaging if hemoptysis recurs.   NP follow up next week.  Patient expressed understanding and was in agreement with this plan. She also understands that She can call clinic at any time with any questions, concerns, or complaints.   Thank you for allowing me to participate in the care of this very pleasant patient.   Time Total: 20 minutes  Visit consisted of counseling and education dealing  with the complex and emotionally intense issues of symptom management in the setting of serious illness.Greater than 50%  of this time was spent counseling and coordinating care related to the above assessment and plan.  Signed by: Fonda Mower, PhD, NP-C

## 2024-04-14 NOTE — Telephone Encounter (Signed)
 I was told just now that the patient needs to go to the ER and she will not go to the radiation today.  Patient says that is good for me. The minuteon phone she said that it is none, she coughs all the time.

## 2024-04-15 ENCOUNTER — Other Ambulatory Visit: Payer: Self-pay

## 2024-04-15 ENCOUNTER — Ambulatory Visit
Admission: RE | Admit: 2024-04-15 | Discharge: 2024-04-15 | Disposition: A | Discharge status: Home / Self Care | Source: Ambulatory Visit | Attending: Radiation Oncology | Admitting: Radiation Oncology

## 2024-04-15 DIAGNOSIS — C3431 Malignant neoplasm of lower lobe, right bronchus or lung: Secondary | ICD-10-CM | POA: Diagnosis not present

## 2024-04-15 LAB — RAD ONC ARIA SESSION SUMMARY
Course Elapsed Days: 30
Plan Fractions Treated to Date: 22
Plan Prescribed Dose Per Fraction: 2 Gy
Plan Total Fractions Prescribed: 33
Plan Total Prescribed Dose: 66 Gy
Reference Point Dosage Given to Date: 44 Gy
Reference Point Session Dosage Given: 2 Gy
Session Number: 22

## 2024-04-16 ENCOUNTER — Other Ambulatory Visit: Payer: Self-pay

## 2024-04-16 ENCOUNTER — Ambulatory Visit
Admission: RE | Admit: 2024-04-16 | Discharge: 2024-04-16 | Disposition: A | Discharge status: Home / Self Care | Source: Ambulatory Visit | Attending: Radiation Oncology | Admitting: Radiation Oncology

## 2024-04-16 DIAGNOSIS — C3431 Malignant neoplasm of lower lobe, right bronchus or lung: Secondary | ICD-10-CM | POA: Diagnosis not present

## 2024-04-16 LAB — RAD ONC ARIA SESSION SUMMARY
Course Elapsed Days: 31
Plan Fractions Treated to Date: 23
Plan Prescribed Dose Per Fraction: 2 Gy
Plan Total Fractions Prescribed: 33
Plan Total Prescribed Dose: 66 Gy
Reference Point Dosage Given to Date: 46 Gy
Reference Point Session Dosage Given: 2 Gy
Session Number: 23

## 2024-04-19 ENCOUNTER — Inpatient Hospital Stay (HOSPITAL_BASED_OUTPATIENT_CLINIC_OR_DEPARTMENT_OTHER): Admitting: Nurse Practitioner

## 2024-04-19 ENCOUNTER — Encounter: Payer: Self-pay | Admitting: Nurse Practitioner

## 2024-04-19 ENCOUNTER — Inpatient Hospital Stay

## 2024-04-19 ENCOUNTER — Other Ambulatory Visit: Payer: Self-pay

## 2024-04-19 ENCOUNTER — Ambulatory Visit
Admission: RE | Admit: 2024-04-19 | Discharge: 2024-04-19 | Disposition: A | Discharge status: Home / Self Care | Source: Ambulatory Visit | Attending: Radiation Oncology | Admitting: Radiation Oncology

## 2024-04-19 VITALS — BP 106/56 | HR 85 | Resp 18

## 2024-04-19 VITALS — BP 115/53 | HR 64 | Temp 97.0°F | Resp 20 | Ht 61.0 in | Wt 78.4 lb

## 2024-04-19 DIAGNOSIS — K5903 Drug induced constipation: Secondary | ICD-10-CM | POA: Diagnosis not present

## 2024-04-19 DIAGNOSIS — C3431 Malignant neoplasm of lower lobe, right bronchus or lung: Secondary | ICD-10-CM | POA: Diagnosis not present

## 2024-04-19 DIAGNOSIS — Z5111 Encounter for antineoplastic chemotherapy: Secondary | ICD-10-CM

## 2024-04-19 DIAGNOSIS — K208 Other esophagitis without bleeding: Secondary | ICD-10-CM

## 2024-04-19 LAB — CBC WITH DIFFERENTIAL (CANCER CENTER ONLY)
Abs Immature Granulocytes: 0.02 K/uL (ref 0.00–0.07)
Basophils Absolute: 0 K/uL (ref 0.0–0.1)
Basophils Relative: 1 %
Eosinophils Absolute: 0 K/uL (ref 0.0–0.5)
Eosinophils Relative: 1 %
HCT: 32.3 % — ABNORMAL LOW (ref 36.0–46.0)
Hemoglobin: 11.1 g/dL — ABNORMAL LOW (ref 12.0–15.0)
Immature Granulocytes: 0 %
Lymphocytes Relative: 9 %
Lymphs Abs: 0.5 K/uL — ABNORMAL LOW (ref 0.7–4.0)
MCH: 31.3 pg (ref 26.0–34.0)
MCHC: 34.4 g/dL (ref 30.0–36.0)
MCV: 91 fL (ref 80.0–100.0)
Monocytes Absolute: 0.6 K/uL (ref 0.1–1.0)
Monocytes Relative: 11 %
Neutro Abs: 4.3 K/uL (ref 1.7–7.7)
Neutrophils Relative %: 78 %
Platelet Count: 324 K/uL (ref 150–400)
RBC: 3.55 MIL/uL — ABNORMAL LOW (ref 3.87–5.11)
RDW: 14.5 % (ref 11.5–15.5)
WBC Count: 5.4 K/uL (ref 4.0–10.5)
nRBC: 0 % (ref 0.0–0.2)

## 2024-04-19 LAB — CMP (CANCER CENTER ONLY)
ALT: 13 U/L (ref 0–44)
AST: 17 U/L (ref 15–41)
Albumin: 3.3 g/dL — ABNORMAL LOW (ref 3.5–5.0)
Alkaline Phosphatase: 68 U/L (ref 38–126)
Anion gap: 8 (ref 5–15)
BUN: 18 mg/dL (ref 8–23)
CO2: 24 mmol/L (ref 22–32)
Calcium: 9.1 mg/dL (ref 8.9–10.3)
Chloride: 102 mmol/L (ref 98–111)
Creatinine: 0.53 mg/dL (ref 0.44–1.00)
GFR, Estimated: >60 mL/min (ref >60)
Glucose, Bld: 101 mg/dL — ABNORMAL HIGH (ref 70–99)
Potassium: 4 mmol/L (ref 3.5–5.1)
Sodium: 134 mmol/L — ABNORMAL LOW (ref 135–145)
Total Bilirubin: 0.6 mg/dL (ref 0.0–1.2)
Total Protein: 7.5 g/dL (ref 6.5–8.1)

## 2024-04-19 LAB — RAD ONC ARIA SESSION SUMMARY
Course Elapsed Days: 34
Plan Fractions Treated to Date: 24
Plan Prescribed Dose Per Fraction: 2 Gy
Plan Total Fractions Prescribed: 33
Plan Total Prescribed Dose: 66 Gy
Reference Point Dosage Given to Date: 48 Gy
Reference Point Session Dosage Given: 2 Gy
Session Number: 24

## 2024-04-19 MED ORDER — SODIUM CHLORIDE 0.9 % IV SOLN
45.0000 mg/m2 | Freq: Once | INTRAVENOUS | Status: AC
  Administered 2024-04-19: 54 mg via INTRAVENOUS
  Filled 2024-04-19: qty 9

## 2024-04-19 MED ORDER — SODIUM CHLORIDE 0.9 % IV SOLN
INTRAVENOUS | Status: DC
  Filled 2024-04-19: qty 250

## 2024-04-19 MED ORDER — SODIUM CHLORIDE 0.9 % IV SOLN
108.6000 mg | Freq: Once | INTRAVENOUS | Status: AC
  Administered 2024-04-19: 110 mg via INTRAVENOUS
  Filled 2024-04-19: qty 11

## 2024-04-19 MED ORDER — PALONOSETRON HCL INJECTION 0.25 MG/5ML
0.2500 mg | Freq: Once | INTRAVENOUS | Status: AC
  Administered 2024-04-19: 0.25 mg via INTRAVENOUS
  Filled 2024-04-19: qty 5

## 2024-04-19 MED ORDER — DIPHENHYDRAMINE HCL 50 MG/ML IJ SOLN
25.0000 mg | Freq: Once | INTRAMUSCULAR | Status: AC
  Administered 2024-04-19: 25 mg via INTRAVENOUS
  Filled 2024-04-19: qty 1

## 2024-04-19 MED ORDER — FAMOTIDINE IN NACL 20-0.9 MG/50ML-% IV SOLN
20.0000 mg | Freq: Once | INTRAVENOUS | Status: AC
  Administered 2024-04-19: 20 mg via INTRAVENOUS
  Filled 2024-04-19: qty 50

## 2024-04-19 MED ORDER — DEXAMETHASONE SOD PHOSPHATE PF 10 MG/ML IJ SOLN
4.0000 mg | Freq: Once | INTRAMUSCULAR | Status: AC
  Administered 2024-04-19: 4 mg via INTRAVENOUS

## 2024-04-19 NOTE — Progress Notes (Signed)
 C/o not feeling good. No energy, no n/v, no fever. Didn't eat for 2 days. No appetitie, 2 boost per day. Has also been constipated, using a laxative, bms are starting to move.

## 2024-04-19 NOTE — Patient Instructions (Signed)
 [facility] - A DEPT OF [facility]  Discharge Instructions: Thank you for choosing [facility] to provide your oncology and hematology care.  If you have a lab appointment with the Cancer Center, please go directly to the Cancer Center and check in at the registration area.  Wear comfortable clothing and clothing appropriate for easy access to any Portacath or PICC line.   We strive to give you quality time with your provider. You may need to reschedule your appointment if you arrive late (15 or more minutes).  Arriving late affects you and other patients whose appointments are after yours.  Also, if you miss three or more appointments without notifying the office, you may be dismissed from the clinic at the provider's discretion.      For prescription refill requests, have your pharmacy contact our office and allow 72 hours for refills to be completed.    Today you received the following chemotherapy and/or immunotherapy agents TAXOL and CARBOPLATIN       To help prevent nausea and vomiting after your treatment, we encourage you to take your nausea medication as directed.  BELOW ARE SYMPTOMS THAT SHOULD BE REPORTED IMMEDIATELY: *FEVER GREATER THAN 100.4 F (38 C) OR HIGHER *CHILLS OR SWEATING *NAUSEA AND VOMITING THAT IS NOT CONTROLLED WITH YOUR NAUSEA MEDICATION *UNUSUAL SHORTNESS OF BREATH *UNUSUAL BRUISING OR BLEEDING *URINARY PROBLEMS (pain or burning when urinating, or frequent urination) *BOWEL PROBLEMS (unusual diarrhea, constipation, pain near the anus) TENDERNESS IN MOUTH AND THROAT WITH OR WITHOUT PRESENCE OF ULCERS (sore throat, sores in mouth, or a toothache) UNUSUAL RASH, SWELLING OR PAIN  UNUSUAL VAGINAL DISCHARGE OR ITCHING   Items with * indicate a potential emergency and should be followed up as soon as possible or go to the Emergency Department if any problems should occur.  Please show the CHEMOTHERAPY ALERT CARD or  IMMUNOTHERAPY ALERT CARD at check-in to the Emergency Department and triage nurse.  Should you have questions after your visit or need to cancel or reschedule your appointment, please contact [facility] - A DEPT OF [facility]  [phone] and follow the prompts.  Office hours are 8:00 a.m. to 4:30 p.m. Monday - Friday. Please note that voicemails left after 4:00 p.m. may not be returned until the following business day.  We are closed weekends and major holidays. You have access to a nurse at all times for urgent questions. Please call the main number to the clinic [phone] and follow the prompts.  For any non-urgent questions, you may also contact your provider using MyChart. We now offer e-Visits for anyone 83 and older to request care online for non-urgent symptoms. For details visit [email or web address].   Also download the MyChart app! Go to the app store, search "MyChart", open the app, select [facility], and log in with your MyChart username and password.  Paclitaxel Injection What is this medication? PACLITAXEL (PAK li TAX el) treats some types of cancer. It works by slowing down the growth of cancer cells. This medicine may be used for other purposes; ask your health care provider or pharmacist if you have questions. COMMON BRAND NAME(S): Onxol, Taxol What should I tell my care team before I take this medication? They need to know if you have any of these conditions: Heart disease Liver disease Low white blood cell levels An unusual or allergic reaction to paclitaxel, other medications, foods, dyes, or preservatives  If you or your partner are pregnant or trying to get pregnant Breast-feeding How should I use this medication? This medication is injected into a vein. It is given by your care team in a hospital or clinic setting. Talk to your care team about the use of this medication in children. While it may be given to children for selected  conditions, precautions do apply. Overdosage: If you think you have taken too much of this medicine contact a poison control center or emergency room at once. NOTE: This medicine is only for you. Do not share this medicine with others. What if I miss a dose? Keep appointments for follow-up doses. It is important not to miss your dose. Call your care team if you are unable to keep an appointment. What may interact with this medication? Do not take this medication with any of the following: Live virus vaccines Other medications may affect the way this medication works. Talk with your care team about all of the medications you take. They may suggest changes to your treatment plan to lower the risk of side effects and to make sure your medications work as intended. This list may not describe all possible interactions. Give your health care provider a list of all the medicines, herbs, non-prescription drugs, or dietary supplements you use. Also tell them if you smoke, drink alcohol, or use illegal drugs. Some items may interact with your medicine. What should I watch for while using this medication? Your condition will be monitored carefully while you are receiving this medication. You may need blood work while taking this medication. This medication may make you feel generally unwell. This is not uncommon as chemotherapy can affect healthy cells as well as cancer cells. Report any side effects. Continue your course of treatment even though you feel ill unless your care team tells you to stop. This medication can cause serious allergic reactions. To reduce the risk, your care team may give you other medications to take before receiving this one. Be sure to follow the directions from your care team. This medication may increase your risk of getting an infection. Call your care team for advice if you get a fever, chills, sore throat, or other symptoms of a cold or flu. Do not treat yourself. Try to avoid  being around people who are sick. This medication may increase your risk to bruise or bleed. Call your care team if you notice any unusual bleeding. Be careful brushing or flossing your teeth or using a toothpick because you may get an infection or bleed more easily. If you have any dental work done, tell your dentist you are receiving this medication. Talk to your care team if you may be pregnant. Serious birth defects can occur if you take this medication during pregnancy. Talk to your care team before breastfeeding. Changes to your treatment plan may be needed. What side effects may I notice from receiving this medication? Side effects that you should report to your care team as soon as possible: Allergic reactions--skin rash, itching, hives, swelling of the face, lips, tongue, or throat Heart rhythm changes--fast or irregular heartbeat, dizziness, feeling faint or lightheaded, chest pain, trouble breathing Increase in blood pressure Infection--fever, chills, cough, sore throat, wounds that don't heal, pain or trouble when passing urine, general feeling of discomfort or being unwell Low blood pressure--dizziness, feeling faint or lightheaded, blurry vision Low red blood cell level--unusual weakness or fatigue, dizziness, headache, trouble breathing Painful swelling, warmth, or redness of the skin, blisters  or sores at the infusion site Pain, tingling, or numbness in the hands or feet Slow heartbeat--dizziness, feeling faint or lightheaded, confusion, trouble breathing, unusual weakness or fatigue Unusual bruising or bleeding Side effects that usually do not require medical attention (report to your care team if they continue or are bothersome): Diarrhea Hair loss Joint pain Loss of appetite Muscle pain Nausea Vomiting This list may not describe all possible side effects. Call your doctor for medical advice about side effects. You may report side effects to FDA at 1-800-FDA-1088. Where  should I keep my medication? This medication is given in a hospital or clinic. It will not be stored at home. NOTE: This sheet is a summary. It may not cover all possible information. If you have questions about this medicine, talk to your doctor, pharmacist, or health care provider.  2024 Elsevier/Gold Standard (2021-11-13 00:00:00)  Carboplatin Injection What is this medication? CARBOPLATIN (KAR boe pla tin) treats some types of cancer. It works by slowing down the growth of cancer cells. This medicine may be used for other purposes; ask your health care provider or pharmacist if you have questions. COMMON BRAND NAME(S): Paraplatin What should I tell my care team before I take this medication? They need to know if you have any of these conditions: Blood disorders Hearing problems Kidney disease Recent or ongoing radiation therapy An unusual or allergic reaction to carboplatin, cisplatin, other medications, foods, dyes, or preservatives Pregnant or trying to get pregnant Breast-feeding How should I use this medication? This medication is injected into a vein. It is given by your care team in a hospital or clinic setting. Talk to your care team about the use of this medication in children. Special care may be needed. Overdosage: If you think you have taken too much of this medicine contact a poison control center or emergency room at once. NOTE: This medicine is only for you. Do not share this medicine with others. What if I miss a dose? Keep appointments for follow-up doses. It is important not to miss your dose. Call your care team if you are unable to keep an appointment. What may interact with this medication? Medications for seizures Some antibiotics, such as amikacin, gentamicin, neomycin, streptomycin, tobramycin Vaccines This list may not describe all possible interactions. Give your health care provider a list of all the medicines, herbs, non-prescription drugs, or dietary  supplements you use. Also tell them if you smoke, drink alcohol, or use illegal drugs. Some items may interact with your medicine. What should I watch for while using this medication? Your condition will be monitored carefully while you are receiving this medication. You may need blood work while taking this medication. This medication may make you feel generally unwell. This is not uncommon, as chemotherapy can affect healthy cells as well as cancer cells. Report any side effects. Continue your course of treatment even though you feel ill unless your care team tells you to stop. In some cases, you may be given additional medications to help with side effects. Follow all directions for their use. This medication may increase your risk of getting an infection. Call your care team for advice if you get a fever, chills, sore throat, or other symptoms of a cold or flu. Do not treat yourself. Try to avoid being around people who are sick. Avoid taking medications that contain aspirin, acetaminophen, ibuprofen, naproxen, or ketoprofen unless instructed by your care team. These medications may hide a fever. Be careful brushing or flossing your  teeth or using a toothpick because you may get an infection or bleed more easily. If you have any dental work done, tell your dentist you are receiving this medication. Talk to your care team if you wish to become pregnant or think you might be pregnant. This medication can cause serious birth defects. Talk to your care team about effective forms of contraception. Do not breast-feed while taking this medication. What side effects may I notice from receiving this medication? Side effects that you should report to your care team as soon as possible: Allergic reactions--skin rash, itching, hives, swelling of the face, lips, tongue, or throat Infection--fever, chills, cough, sore throat, wounds that don't heal, pain or trouble when passing urine, general feeling of  discomfort or being unwell Low red blood cell level--unusual weakness or fatigue, dizziness, headache, trouble breathing Pain, tingling, or numbness in the hands or feet, muscle weakness, change in vision, confusion or trouble speaking, loss of balance or coordination, trouble walking, seizures Unusual bruising or bleeding Side effects that usually do not require medical attention (report to your care team if they continue or are bothersome): Hair loss Nausea Unusual weakness or fatigue Vomiting This list may not describe all possible side effects. Call your doctor for medical advice about side effects. You may report side effects to FDA at 1-800-FDA-1088. Where should I keep my medication? This medication is given in a hospital or clinic. It will not be stored at home. NOTE: This sheet is a summary. It may not cover all possible information. If you have questions about this medicine, talk to your doctor, pharmacist, or health care provider.  2024 Elsevier/Gold Standard (2021-10-16 00:00:00)

## 2024-04-19 NOTE — Progress Notes (Signed)
 [facility] CONSULT NOTE  Patient Care Team: Odell Chard, Edra GRADE, MD as PCP - General (Family Medicine) Dellie Louanne MATSU, MD (General Surgery) Cindie Jesusa HERO, RN as Registered Nurse Georgina Shasta POUR, RN as Oncology Nurse Navigator Rennie Cindy SAUNDERS, MD as Consulting Physician (Oncology) Verdene Gills, RN as Oncology Nurse Navigator  CHIEF COMPLAINTS/PURPOSE OF CONSULTATION: Lung cancer/breast cancer  #  Oncology History Overview Note  BRCA negative 2013    Breast cancer [facility]) 2005    left breast ca   Breast cancer [facility]) 2006, 1999    right breast ca   Cancer [facility]) [phone]   # Left breast- 1999 mild ducts- s/p lumpectomy; s/p RT- no chemo [Dr.Sankar/Dr.Byrnett]  # 2006 ? Stage- s/p right mastectomy s/p chemo [Dr.Choksi]; Dr.Chrystal     DIAGNOSIS: A.  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 Size of invasive carcinoma: 2 mm Mm in this sample Histologic grade of invasive carcinoma: Grade 2                      Glandular/tubular differentiation score: 3                      Nuclear pleomorphism score: 3                      Mitotic rate score: 1                      Total score: 7 Ductal carcinoma in situ: Present, high-grade comedo type with associated calcifications Lymphovascular invasion: Not identified  Comment: The definitive grade will be assigned on the excisional specimen. ER/PR/HER2: Immunohistochemistry will be performed on block A2, with reflex to FISH for HER2 2+. The results will be reported in an addendum.  GROSS DESCRIPTION: A. Labeled: Left breast stereo biopsy calcs upper outer quadrant mid depth Received: in a formalin-filled Brevera collection device Specimen radiograph image(s) available for review Time/Date in fixative: Collected at 10:58 AM on 10/23/2022 and placed in formalin at 11:01 AM on 10/23/2022 Cold ischemic time: Less  than 5 minutes Total fixation time: Approximately 9.25 hours Core pieces: Multiple Measurement: Aggregate, 8.5 x 1.5 x 0.3 cm Description / comments: Received are cores and fragments of yellow fibrofatty tissue.  The accompanying diagram has sections B and G checked. Inked: Green Entirely submitted in cassette(s):  1 - section B 2 - section G 3 - 7 - remaining tissue fragments      3 - sections A and C      4 - sections D and E      5 - sections F and H      6 - sections I and J      7 - sections K and L with remaining free-floating fragments  RB 10/23/2022  Final Diagnosis performed by Rexene Daily, MD.   Electronically signed 10/24/2022 10:40:25AM The electronic signature indicates that the named Attending Pathologist has evaluated the specimen Technical component performed at [facility], [address], [place], [place] [address] Lab: [phone] Dir: Frankey Sas, MD, MMM  Professional component performed at [facility], [facility], [address] [place], [place], [place] [address] Lab: [phone] Dir: Wilkie EMERSON Molt, MD  ADDENDUM: CASE SUMMARY: BREAST BIOMARKER TESTS Estrogen Receptor (ER) Status: POSITIVE         Percentage of cells with nuclear positivity: Greater than 90%  Average intensity of staining: Strong  Progesterone Receptor (PgR) Status: NEGATIVE (LESS THAN 1%)         Internal control cells present and stain as expected  HER2 (by immunohistochemistry): NEGATIVE (Score 1+)  Histologic Type: Invasive carcinoma of no special type (ductal) Histologic Grade (Nottingham Histologic Score)      Glandular (Acinar)/Tubular Differentiation: 3      Nuclear Pleomorphism: 3      Mitotic Rate: 3      Overall Grade: Grade 3 Tumor Size: At least 17 mm Tumor Focality: Single focus of invasive carcinoma Ductal Carcinoma In Situ (DCIS): Present      Positive for extensive intraductal component (EIC) Tumor Extent: Not applicable Lymphatic and/or  Vascular Invasion: Not identified  Treatment Effect in the Breast: No known presurgical therapy  MARGINS Margin Status for Invasive Carcinoma: All margins negative for invasive carcinoma   APRIL/MAY 2024-left breast invasive mammary carcinoma s/p mastectomy [Dr. Rodenberg]-at least pT1c [17 mm]; 2 sentinel lymph nodes negative; grade 3; Oncotype 35 high risk-declined chemotherapy.   # # AUG 2025-[ [facility]] Dr. Tamea. POSITIVE FOR MALIGNANCY- SQUAMOUS CELL CARCINOMA- CT RIGHT lower lobe, 6.4 cm greatest   PET scan:  Hypermetabolic right lower lobe mass and probable right hilar adenopathy, findings consistent with T2 be N1 M0 or stage II B primary bronchogenic carcinoma.  AUG brain MRI-NEG.   # Treatment: Definitive weekly carbotaxol with the daily radiation. [Start- 03/15/24]       Carcinoma of upper-outer quadrant of left breast in female, estrogen receptor positive (HCC)  10/30/2022 Initial Diagnosis   Carcinoma of upper-outer quadrant of left breast in female, estrogen receptor positive   10/30/2022 Cancer Staging   Staging form: Breast, AJCC 8th Edition - Clinical: Stage IA (cT1a, cN0, cM0, G2, ER+, PR-, HER2-) - Signed by Rennie Cindy SAUNDERS, MD on 10/30/2022 Histologic grading system: 3 grade system   12/05/2022 - 12/05/2022 Chemotherapy   Patient is on Treatment Plan : BREAST TC q21d     Primary cancer of right lower lobe of lung (HCC)  02/23/2024 Initial Diagnosis   Primary cancer of right lower lobe of lung (HCC)   02/23/2024 Cancer Staging   Staging form: Lung, AJCC V9 - Clinical: Stage IIB (cT2b, cN1, cM0) - Signed by Rennie Cindy SAUNDERS, MD on 02/23/2024   03/15/2024 -  Chemotherapy   Patient is on Treatment Plan : LUNG Carboplatin  + Paclitaxel  + XRT q7d      HISTORY OF PRESENTING ILLNESS: Patient ambulating-independently.  Alone.   Gabriella Green 73 y.o. 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  She is currently scheduled to complete radiation on April 30, 2024.  In the interim, she was seen in symptom management clinic for evaluation of hemoptysis and back pain.  Symptoms were felt to likely be related to radiation.  She was prescribed tramadol for her pain which was thought to be secondary to radiation dermatitis.    patient  with history of breast cancer stage I on anastrozole  and right lower lobe lung cancer stage IIb -currently on definitive chemoradiation is here for follow-up.  C/o neck pain and unable to turn it, this is new for her, pain 5/10 x1 week.    No appetite, boost bid. Denies any worsening shortness of breath or cough.  Appetite fair.  No weight loss.   Improved sleeping  after decreasing the dose of steroids. Constipation- improved with dulcolax.    Review of Systems  Constitutional:  Negative for chills, diaphoresis, fever, malaise/fatigue and weight loss.  HENT:  Negative for nosebleeds and sore throat.   Eyes:  Negative for double vision.  Respiratory:  Positive for cough. Negative for hemoptysis, sputum production, shortness of breath and wheezing.   Cardiovascular:  Negative for chest pain, palpitations, orthopnea and leg swelling.  Gastrointestinal:  Negative for abdominal pain, blood in stool, constipation, diarrhea, heartburn, melena, nausea and vomiting.  Genitourinary:  Negative for dysuria, frequency and urgency.  Musculoskeletal:  Positive for back pain and joint pain.  Skin: Negative.  Negative for itching and rash.  Neurological:  Negative for dizziness, tingling, focal weakness, weakness and headaches.  Endo/Heme/Allergies:  Does not bruise/bleed easily.  Psychiatric/Behavioral:  Negative for depression. The patient is not nervous/anxious and does not have insomnia.      MEDICAL HISTORY:  Past Medical History:  Diagnosis Date   BRCA negative 2013    Breast cancer (HCC) 2005   left breast ca   Breast cancer (HCC) 2006, 1999   right breast ca   COPD (chronic obstructive pulmonary disease) (HCC)    Mass of right lung    Multiple lung nodules    Personal history of chemotherapy    2006 rt breast ca   Personal history of radiation therapy    PONV (postoperative nausea and vomiting)    RBBB (right bundle branch block)     SURGICAL HISTORY: Past Surgical History:  Procedure Laterality Date   BREAST BIOPSY Left 2019    ADENOSIS, SKELETAL MUSCLE of LEFT breast   BREAST BIOPSY Left 10/23/2022   Left Breast Stereo Bx, X clip - path pending   BREAST BIOPSY Left 10/23/2022   MM LT BREAST BX W LOC DEV 1ST LESION IMAGE BX SPEC STEREO GUIDE 10/23/2022 [facility]   BREAST CYST ASPIRATION Bilateral    BREAST EXCISIONAL BIOPSY Right 1999   rad breast ca   BREAST EXCISIONAL BIOPSY Left 2005   rad breast ca   BREAST EXCISIONAL BIOPSY Right 2006   mastectomy chemo    BREAST LUMPECTOMY Left 2005   1999 right lumpectomy   CHOLECYSTECTOMY  2002   COLONOSCOPY  2009   COLONOSCOPY WITH PROPOFOL  N/A 09/03/2018   Procedure: COLONOSCOPY WITH PROPOFOL ;  Surgeon: Unk Corinn Skiff, MD;  Location: [facility];  Service: Gastroenterology;  Laterality: N/A;   ENDOBRONCHIAL ULTRASOUND Bilateral 02/16/2024   Procedure: ENDOBRONCHIAL ULTRASOUND (EBUS);  Surgeon: Tamea Dedra CROME, MD;  Location: [facility] OR[facility];  Service: Pulmonary;  Laterality: Bilateral;   ESOPHAGOGASTRODUODENOSCOPY (EGD) WITH PROPOFOL  N/A 09/03/2018   Procedure: ESOPHAGOGASTRODUODENOSCOPY (EGD) WITH PROPOFOL ;  Surgeon: Unk Corinn Skiff, MD;  Location: [facility];  Service: Gastroenterology;  Laterality: N/A;   IR IMAGING GUIDED PORT INSERTION  03/04/2024   MASTECTOMY Right 2006   MASTECTOMY W/ SENTINEL NODE BIOPSY Left 11/13/2022   Procedure: MASTECTOMY WITH SENTINEL LYMPH NODE BIOPSY, total mastectomy;  Surgeon: Lane Shope, MD;  Location: [facility] OR[facility];  Service: General;   Laterality: Left;   VIDEO BRONCHOSCOPY WITH ENDOBRONCHIAL NAVIGATION Bilateral 02/16/2024   Procedure: VIDEO BRONCHOSCOPY WITH ENDOBRONCHIAL NAVIGATION;  Surgeon: Tamea Dedra CROME, MD;  Location: [facility] OR[facility];  Service: Pulmonary;  Laterality: Bilateral;    SOCIAL HISTORY: Social History   Socioeconomic History   Marital status: Widowed    Spouse name: Not on file   Number of children: Not on file   Years of education: Not on file  Highest education level: Not on file  Occupational History   Not on file  Tobacco Use   Smoking status: Every Day    Current packs/day: 0.50    Average packs/day: 1 pack/day for 45.8 years (45.7 ttl pk-yrs)    Types: Cigarettes    Start date: 1980    Passive exposure: Past   Smokeless tobacco: Never  Vaping Use   Vaping status: Never Used  Substance and Sexual Activity   Alcohol use: No   Drug use: No   Sexual activity: Not on file  Other Topics Concern   Not on file  Social History Narrative   Not on file   Social Drivers of Health   Financial Resource Strain: Not on file  Food Insecurity: No Food Insecurity (04/14/2024)   Hunger Vital Sign    Worried About Running Out of Food in the Last Year: Never true    Ran Out of Food in the Last Year: Never true  Transportation Needs: No Transportation Needs (04/14/2024)   PRAPARE - Administrator, Civil Service (Medical): No    Lack of Transportation (Non-Medical): No  Physical Activity: Not on file  Stress: Not on file  Social Connections: Not on file  Intimate Partner Violence: Not on file    FAMILY HISTORY: Family History  Problem Relation Age of Onset   Breast cancer Mother 62   Breast cancer Sister 71   Breast cancer Maternal Aunt 40    ALLERGIES:  is allergic to codeine and tape.  MEDICATIONS:  Current Outpatient Medications  Medication Sig Dispense Refill   acetaminophen  (TYLENOL ) 500 MG tablet Take 500 mg by mouth every 6 (six) hours as needed for mild pain (pain  score 1-3) or moderate pain (pain score 4-6).     amoxicillin (AMOXIL) 875 MG tablet Take 1 tablet (875 mg total) by mouth 2 (two) times daily. (Patient not taking: Reported on 04/19/2024) 20 tablet 0   anastrozole  (ARIMIDEX ) 1 MG tablet TAKE 1 TABLET(1 MG) BY MOUTH DAILY 90 tablet 1   aspirin EC 81 MG tablet Take 81 mg by mouth at bedtime. Swallow whole.     lidocaine -prilocaine  (EMLA ) cream Apply to affected area once 30 g 3   omeprazole  (PRILOSEC) 40 MG capsule Take 1 capsule (40 mg total) by mouth daily. (Patient not taking: Reported on 04/19/2024) 30 capsule 2   ondansetron  (ZOFRAN ) 8 MG tablet Take 1 tablet (8 mg total) by mouth every 8 (eight) hours as needed for nausea or vomiting. Start on the third day after chemotherapy. (Patient not taking: Reported on 04/19/2024) 30 tablet 1   ondansetron  (ZOFRAN -ODT) 4 MG disintegrating tablet Take 4 mg by mouth every 8 (eight) hours as needed. (Patient not taking: Reported on 04/19/2024)     prochlorperazine  (COMPAZINE ) 10 MG tablet Take 1 tablet (10 mg total) by mouth every 6 (six) hours as needed for nausea or vomiting. (Patient not taking: Reported on 04/19/2024) 30 tablet 1   traMADol (ULTRAM) 50 MG tablet Take 1 tablet (50 mg total) by mouth every 6 (six) hours as needed. 30 tablet 0   triamcinolone ointment (KENALOG) 0.5 % Apply 1 Application topically 2 (two) times daily. 30 g 0   No current facility-administered medications for this visit.     PHYSICAL EXAMINATION:   Vitals:   04/19/24 0837  BP: (!) 115/53  Pulse: 64  Resp: 20  Temp: (!) 97 F (36.1 C)  SpO2: 91%       Filed  Weights   04/19/24 0837  Weight: 78 lb 6.4 oz (35.6 kg)     Physical Exam Vitals and nursing note reviewed.  HENT:     Head: Normocephalic and atraumatic.     Mouth/Throat:     Pharynx: Oropharynx is clear.  Eyes:     Extraocular Movements: Extraocular movements intact.     Pupils: Pupils are equal, round, and reactive to light.   Cardiovascular:     Rate and Rhythm: Normal rate and regular rhythm.  Pulmonary:     Comments: Decreased breath sounds bilaterally.  Abdominal:     Palpations: Abdomen is soft.  Musculoskeletal:        General: Normal range of motion.     Cervical back: Normal range of motion.  Skin:    General: Skin is warm.  Neurological:     General: No focal deficit present.     Mental Status: She is alert and oriented to person, place, and time.  Psychiatric:        Behavior: Behavior normal.        Judgment: Judgment normal.    LABORATORY DATA:  I have reviewed the data as listed Lab Results  Component Value Date   WBC 5.4 04/19/2024   HGB 11.1 (L) 04/19/2024   HCT 32.3 (L) 04/19/2024   MCV 91.0 04/19/2024   PLT 324 04/19/2024   Recent Labs    04/05/24 0819 04/12/24 0822 04/19/24 0833  NA 136 136 134*  K 4.3 4.3 4.0  CL 104 105 102  CO2 24 25 24   GLUCOSE 106* 104* 101*  BUN 14 18 18   CREATININE 0.51 0.45 0.53  CALCIUM 9.3 9.2 9.1  GFRNONAA >60 >60 >60  PROT 6.9 7.4 7.5  ALBUMIN 3.4* 3.4* 3.3*  AST 18 21 17   ALT 15 16 13   ALKPHOS 75 70 68  BILITOT 0.5 0.3 0.6     RADIOGRAPHIC STUDIES: I have personally reviewed the radiological images as listed and agreed with the findings in the report. No results found.   ASSESSMENT & PLAN:   # Primary cancer of the right lower lobe-August 2025 ([facility]) Dr. Tamea.  Positive for malignancy-squamous cell carcinoma.  CT right lower lobe 6.4 cm.  PET scan showed hypermetabolic right lower lobe mass and probable right hilar adenopathy.  Findings consistent with T2bN1M0, stage IIb primary bronchogenic carcinoma.  August 2025 brain MRI was negative.  Definitive weekly carbo-Taxol  with daily radiation was recommended.  She initiated on 03/15/2024 and plans to finish end of October.  # Labs today reviewed and acceptable for continuation of treatment.  Proceed with cycle 6 of 7 planned weekly carbo-Taxol  chemotherapy treatments.  She is  currently scheduled to complete radiation on 05/02/2024..  Will perform CT for restaging end of November and she will follow-up with Dr. Rennie for discussion of results and to proceed with maintenance Durvalumab every 4 weeks for maintenance at that time.  Will plan to discuss Durvalumab and enter orders and into integrated scheduling at next visit  # Dysphagia-new as she has progressed through radiation and I suspect it is related to her radiation treatments.  I recommended that she start her previously prescribed omeprazole .  If ongoing symptoms she may benefit from sucralfate.   # Neck pain- x1 week- likely MSK- on Tylenol  not improved- recommend topical NSAIDs-/biofreeze- voltaren gel.    # Difficulty sleeping sec to steroids- dose steroid decreased- Improved.    # constipation- G-1-2- metamucil- dulclax-miralax prn.  Today we discussed optimizing  her stool softeners.  I recommend that she take magnesium citrate to alleviate acute constipation then maintenance with MiraLAX 1-3 times a day and senna 2 tablets 1-2 times a day.   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    # Bone health- recommend BMD; but declines- recommend calcium 1200 mg/day;OFF vit D- high dose vit D- -[June 2025]- HIGH will rechcek vit D levels.    # active smoker: < 1ppd; recommend quitting-    # IV ACCESS: s/p port placement.    weekly-  labs/port- MD  chemo- x 3 at  time- 10/6- NO MD PS-  # DISPOSITION: Proceed with carbo-Taxol  chemotherapy today I will plan to see her next week for port/labs, see me, cycle 7 of CarboTaxol- la  No problem-specific Assessment & Plan notes found for this encounter.  All questions were answered. The patient/family knows to call the clinic with any problems, questions or concerns.    Tinnie KANDICE Dawn, NP 04/19/2024

## 2024-04-20 ENCOUNTER — Encounter: Payer: Self-pay | Admitting: Internal Medicine

## 2024-04-20 ENCOUNTER — Other Ambulatory Visit: Payer: Self-pay

## 2024-04-20 ENCOUNTER — Ambulatory Visit
Admission: RE | Admit: 2024-04-20 | Discharge: 2024-04-20 | Disposition: A | Discharge status: Home / Self Care | Source: Ambulatory Visit | Attending: Radiation Oncology | Admitting: Radiation Oncology

## 2024-04-20 DIAGNOSIS — C3431 Malignant neoplasm of lower lobe, right bronchus or lung: Secondary | ICD-10-CM | POA: Diagnosis not present

## 2024-04-20 LAB — RAD ONC ARIA SESSION SUMMARY
Course Elapsed Days: 35
Plan Fractions Treated to Date: 25
Plan Prescribed Dose Per Fraction: 2 Gy
Plan Total Fractions Prescribed: 33
Plan Total Prescribed Dose: 66 Gy
Reference Point Dosage Given to Date: 50 Gy
Reference Point Session Dosage Given: 2 Gy
Session Number: 25

## 2024-04-21 ENCOUNTER — Ambulatory Visit
Admission: RE | Admit: 2024-04-21 | Discharge: 2024-04-21 | Disposition: A | Discharge status: Home / Self Care | Source: Ambulatory Visit | Attending: Radiation Oncology | Admitting: Radiation Oncology

## 2024-04-21 ENCOUNTER — Other Ambulatory Visit: Payer: Self-pay

## 2024-04-21 DIAGNOSIS — C3431 Malignant neoplasm of lower lobe, right bronchus or lung: Secondary | ICD-10-CM | POA: Diagnosis not present

## 2024-04-21 LAB — RAD ONC ARIA SESSION SUMMARY
Course Elapsed Days: 36
Plan Fractions Treated to Date: 26
Plan Prescribed Dose Per Fraction: 2 Gy
Plan Total Fractions Prescribed: 33
Plan Total Prescribed Dose: 66 Gy
Reference Point Dosage Given to Date: 52 Gy
Reference Point Session Dosage Given: 2 Gy
Session Number: 26

## 2024-04-22 ENCOUNTER — Ambulatory Visit
Admission: RE | Admit: 2024-04-22 | Discharge: 2024-04-22 | Disposition: A | Discharge status: Home / Self Care | Source: Ambulatory Visit | Attending: Radiation Oncology | Admitting: Radiation Oncology

## 2024-04-22 ENCOUNTER — Other Ambulatory Visit: Payer: Self-pay

## 2024-04-22 DIAGNOSIS — C3431 Malignant neoplasm of lower lobe, right bronchus or lung: Secondary | ICD-10-CM | POA: Diagnosis not present

## 2024-04-22 LAB — RAD ONC ARIA SESSION SUMMARY
Course Elapsed Days: 37
Plan Fractions Treated to Date: 27
Plan Prescribed Dose Per Fraction: 2 Gy
Plan Total Fractions Prescribed: 33
Plan Total Prescribed Dose: 66 Gy
Reference Point Dosage Given to Date: 54 Gy
Reference Point Session Dosage Given: 2 Gy
Session Number: 27

## 2024-04-23 ENCOUNTER — Other Ambulatory Visit: Payer: Self-pay

## 2024-04-23 ENCOUNTER — Ambulatory Visit
Admission: RE | Admit: 2024-04-23 | Discharge: 2024-04-23 | Disposition: A | Source: Ambulatory Visit | Attending: Radiation Oncology | Admitting: Radiation Oncology

## 2024-04-23 DIAGNOSIS — C3431 Malignant neoplasm of lower lobe, right bronchus or lung: Secondary | ICD-10-CM | POA: Diagnosis not present

## 2024-04-23 LAB — RAD ONC ARIA SESSION SUMMARY
Course Elapsed Days: 38
Plan Fractions Treated to Date: 28
Plan Prescribed Dose Per Fraction: 2 Gy
Plan Total Fractions Prescribed: 33
Plan Total Prescribed Dose: 66 Gy
Reference Point Dosage Given to Date: 56 Gy
Reference Point Session Dosage Given: 2 Gy
Session Number: 28

## 2024-04-26 ENCOUNTER — Encounter: Payer: Self-pay | Admitting: Nurse Practitioner

## 2024-04-26 ENCOUNTER — Ambulatory Visit

## 2024-04-26 ENCOUNTER — Inpatient Hospital Stay (HOSPITAL_BASED_OUTPATIENT_CLINIC_OR_DEPARTMENT_OTHER): Admitting: Nurse Practitioner

## 2024-04-26 ENCOUNTER — Inpatient Hospital Stay

## 2024-04-26 ENCOUNTER — Ambulatory Visit
Admission: RE | Admit: 2024-04-26 | Discharge: 2024-04-26 | Disposition: A | Discharge status: Home / Self Care | Source: Ambulatory Visit | Attending: Radiation Oncology | Admitting: Radiation Oncology

## 2024-04-26 ENCOUNTER — Other Ambulatory Visit

## 2024-04-26 ENCOUNTER — Encounter: Payer: Self-pay | Admitting: *Deleted

## 2024-04-26 ENCOUNTER — Other Ambulatory Visit: Payer: Self-pay

## 2024-04-26 VITALS — BP 109/62 | HR 95 | Temp 97.8°F | Resp 20 | Wt 79.0 lb

## 2024-04-26 DIAGNOSIS — Z5111 Encounter for antineoplastic chemotherapy: Secondary | ICD-10-CM | POA: Diagnosis not present

## 2024-04-26 DIAGNOSIS — C3431 Malignant neoplasm of lower lobe, right bronchus or lung: Secondary | ICD-10-CM

## 2024-04-26 LAB — CMP (CANCER CENTER ONLY)
ALT: 11 U/L (ref 0–44)
AST: 16 U/L (ref 15–41)
Albumin: 3.3 g/dL — ABNORMAL LOW (ref 3.5–5.0)
Alkaline Phosphatase: 70 U/L (ref 38–126)
Anion gap: 9 (ref 5–15)
BUN: 12 mg/dL (ref 8–23)
CO2: 23 mmol/L (ref 22–32)
Calcium: 9.1 mg/dL (ref 8.9–10.3)
Chloride: 104 mmol/L (ref 98–111)
Creatinine: 0.55 mg/dL (ref 0.44–1.00)
GFR, Estimated: >60 mL/min (ref >60)
Glucose, Bld: 101 mg/dL — ABNORMAL HIGH (ref 70–99)
Potassium: 4 mmol/L (ref 3.5–5.1)
Sodium: 136 mmol/L (ref 135–145)
Total Bilirubin: 0.6 mg/dL (ref 0.0–1.2)
Total Protein: 7.4 g/dL (ref 6.5–8.1)

## 2024-04-26 LAB — RAD ONC ARIA SESSION SUMMARY
Course Elapsed Days: 41
Plan Fractions Treated to Date: 29
Plan Prescribed Dose Per Fraction: 2 Gy
Plan Total Fractions Prescribed: 33
Plan Total Prescribed Dose: 66 Gy
Reference Point Dosage Given to Date: 58 Gy
Reference Point Session Dosage Given: 2 Gy
Session Number: 29

## 2024-04-26 LAB — CBC WITH DIFFERENTIAL (CANCER CENTER ONLY)
Abs Immature Granulocytes: 0.02 K/uL (ref 0.00–0.07)
Basophils Absolute: 0 K/uL (ref 0.0–0.1)
Basophils Relative: 1 %
Eosinophils Absolute: 0 K/uL (ref 0.0–0.5)
Eosinophils Relative: 1 %
HCT: 32.5 % — ABNORMAL LOW (ref 36.0–46.0)
Hemoglobin: 11.2 g/dL — ABNORMAL LOW (ref 12.0–15.0)
Immature Granulocytes: 0 %
Lymphocytes Relative: 8 %
Lymphs Abs: 0.4 K/uL — ABNORMAL LOW (ref 0.7–4.0)
MCH: 31.2 pg (ref 26.0–34.0)
MCHC: 34.5 g/dL (ref 30.0–36.0)
MCV: 90.5 fL (ref 80.0–100.0)
Monocytes Absolute: 0.4 K/uL (ref 0.1–1.0)
Monocytes Relative: 8 %
Neutro Abs: 4.2 K/uL (ref 1.7–7.7)
Neutrophils Relative %: 82 %
Platelet Count: 338 K/uL (ref 150–400)
RBC: 3.59 MIL/uL — ABNORMAL LOW (ref 3.87–5.11)
RDW: 15 % (ref 11.5–15.5)
WBC Count: 5.1 K/uL (ref 4.0–10.5)
nRBC: 0 % (ref 0.0–0.2)

## 2024-04-26 MED ORDER — SODIUM CHLORIDE 0.9 % IV SOLN
Freq: Once | INTRAVENOUS | Status: AC
  Filled 2024-04-26: qty 250

## 2024-04-26 MED ORDER — SODIUM CHLORIDE 0.9 % IV SOLN
45.0000 mg/m2 | Freq: Once | INTRAVENOUS | Status: AC
  Administered 2024-04-26: 54 mg via INTRAVENOUS
  Filled 2024-04-26: qty 9

## 2024-04-26 MED ORDER — FAMOTIDINE IN NACL 20-0.9 MG/50ML-% IV SOLN
20.0000 mg | Freq: Once | INTRAVENOUS | Status: AC
  Administered 2024-04-26: 20 mg via INTRAVENOUS
  Filled 2024-04-26: qty 50

## 2024-04-26 MED ORDER — DIPHENHYDRAMINE HCL 50 MG/ML IJ SOLN
25.0000 mg | Freq: Once | INTRAMUSCULAR | Status: AC
  Administered 2024-04-26: 25 mg via INTRAVENOUS
  Filled 2024-04-26: qty 1

## 2024-04-26 MED ORDER — SODIUM CHLORIDE 0.9 % IV SOLN
108.6000 mg | Freq: Once | INTRAVENOUS | Status: AC
  Administered 2024-04-26: 110 mg via INTRAVENOUS
  Filled 2024-04-26: qty 11

## 2024-04-26 MED ORDER — DEXAMETHASONE SOD PHOSPHATE PF 10 MG/ML IJ SOLN
4.0000 mg | Freq: Once | INTRAMUSCULAR | Status: AC
  Administered 2024-04-26: 4 mg via INTRAVENOUS

## 2024-04-26 MED ORDER — SODIUM CHLORIDE 0.9 % IV SOLN
INTRAVENOUS | Status: DC
  Filled 2024-04-26 (×2): qty 250

## 2024-04-26 MED ORDER — PALONOSETRON HCL INJECTION 0.25 MG/5ML
0.2500 mg | Freq: Once | INTRAVENOUS | Status: AC
  Administered 2024-04-26: 0.25 mg via INTRAVENOUS
  Filled 2024-04-26: qty 5

## 2024-04-26 NOTE — Progress Notes (Signed)
 [name]  Patient Care Team: Odell Chard, Edra GRADE, MD as PCP - General (Family Medicine) Dellie Louanne MATSU, MD (General Surgery) Cindie Jesusa HERO, RN as Registered Nurse Georgina Shasta POUR, RN as Oncology Nurse Navigator Rennie Cindy SAUNDERS, MD as Consulting Physician (Oncology) Verdene Gills, RN as Oncology Nurse Navigator  CHIEF COMPLAINTS/PURPOSE OF CONSULTATION: Lung cancer/breast cancer  #  Oncology History Overview Note  BRCA negative 2013    Breast cancer [facility]) 2005    left breast ca   Breast cancer [facility]) 2006, 1999    right breast ca   Cancer [facility]) [phone]   # Left breast- 1999 mild ducts- s/p lumpectomy; s/p RT- no chemo [Dr.Sankar/Dr.Byrnett]  # 2006 ? Stage- s/p right mastectomy s/p chemo [Dr.Choksi]; Dr.Chrystal     DIAGNOSIS: A.  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 Size of invasive carcinoma: 2 mm Mm in this sample Histologic grade of invasive carcinoma: Grade 2                      Glandular/tubular differentiation score: 3                      Nuclear pleomorphism score: 3                      Mitotic rate score: 1                      Total score: 7 Ductal carcinoma in situ: Present, high-grade comedo type with associated calcifications Lymphovascular invasion: Not identified  Comment: The definitive grade will be assigned on the excisional specimen. ER/PR/HER2: Immunohistochemistry will be performed on block A2, with reflex to FISH for HER2 2+. The results will be reported in an addendum.  GROSS DESCRIPTION: A. Labeled: Left breast stereo biopsy calcs upper outer quadrant mid depth Received: in a formalin-filled Brevera collection device Specimen radiograph image(s) available for review Time/Date in fixative: Collected at 10:58 AM on 10/23/2022 and placed in formalin at 11:01 AM on 10/23/2022 Cold ischemic time: Less  than 5 minutes Total fixation time: Approximately 9.25 hours Core pieces: Multiple Measurement: Aggregate, 8.5 x 1.5 x 0.3 cm Description / comments: Received are cores and fragments of yellow fibrofatty tissue.  The accompanying diagram has sections B and G checked. Inked: Green Entirely submitted in cassette(s):  1 - section B 2 - section G 3 - 7 - remaining tissue fragments      3 - sections A and C      4 - sections D and E      5 - sections F and H      6 - sections I and J      7 - sections K and L with remaining free-floating fragments  RB 10/23/2022  Final Diagnosis performed by Rexene Daily, MD.   Electronically signed 10/24/2022 10:40:25AM The electronic signature indicates that the named Attending Pathologist has evaluated the specimen Technical component performed at [place], [address], [place], [place] [address] Lab: [phone] Dir: Frankey Sas, MD, MMM  Professional component performed at [facility], [facility], [address] [place], [place], [place] [address] Lab: [phone] Dir: Wilkie EMERSON Molt, MD  ADDENDUM: CASE SUMMARY: BREAST BIOMARKER TESTS Estrogen Receptor (ER) Status: POSITIVE         Percentage of cells with nuclear positivity: Greater than 90%  Average intensity of staining: Strong  Progesterone Receptor (PgR) Status: NEGATIVE (LESS THAN 1%)         Internal control cells present and stain as expected  HER2 (by immunohistochemistry): NEGATIVE (Score 1+)  Histologic Type: Invasive carcinoma of no special type (ductal) Histologic Grade (Nottingham Histologic Score)      Glandular (Acinar)/Tubular Differentiation: 3      Nuclear Pleomorphism: 3      Mitotic Rate: 3      Overall Grade: Grade 3 Tumor Size: At least 17 mm Tumor Focality: Single focus of invasive carcinoma Ductal Carcinoma In Situ (DCIS): Present      Positive for extensive intraductal component (EIC) Tumor Extent: Not applicable Lymphatic and/or  Vascular Invasion: Not identified  Treatment Effect in the Breast: No known presurgical therapy  MARGINS Margin Status for Invasive Carcinoma: All margins negative for invasive carcinoma   APRIL/MAY 2024-left breast invasive mammary carcinoma s/p mastectomy [Dr. Rodenberg]-at least pT1c [17 mm]; 2 sentinel lymph nodes negative; grade 3; Oncotype 35 high risk-declined chemotherapy.   # # AUG 2025-[ [facility]] Dr. Tamea. POSITIVE FOR MALIGNANCY- SQUAMOUS CELL CARCINOMA- CT RIGHT lower lobe, 6.4 cm greatest   PET scan:  Hypermetabolic right lower lobe mass and probable right hilar adenopathy, findings consistent with T2 be N1 M0 or stage II B primary bronchogenic carcinoma.  AUG brain MRI-NEG.   # Treatment: Definitive weekly carbotaxol with the daily radiation. [Start- 03/15/24]       Carcinoma of upper-outer quadrant of left breast in female, estrogen receptor positive (HCC)  10/30/2022 Initial Diagnosis   Carcinoma of upper-outer quadrant of left breast in female, estrogen receptor positive   10/30/2022 Cancer Staging   Staging form: Breast, AJCC 8th Edition - Clinical: Stage IA (cT1a, cN0, cM0, G2, ER+, PR-, HER2-) - Signed by Rennie Cindy SAUNDERS, MD on 10/30/2022 Histologic grading system: 3 grade system   12/05/2022 - 12/05/2022 Chemotherapy   Patient is on Treatment Plan : BREAST TC q21d     Primary cancer of right lower lobe of lung (HCC)  02/23/2024 Initial Diagnosis   Primary cancer of right lower lobe of lung (HCC)   02/23/2024 Cancer Staging   Staging form: Lung, AJCC V9 - Clinical: Stage IIB (cT2b, cN1, cM0) - Signed by Rennie Cindy SAUNDERS, MD on 02/23/2024   03/15/2024 -  Chemotherapy   Patient is on Treatment Plan : LUNG Carboplatin  + Paclitaxel  + XRT q7d      HISTORY OF PRESENTING ILLNESS: Patient ambulating-independently.  Alone.   Gabriella Green 73 y.o. 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  She is currently scheduled to complete radiation on April 30, 2024.    Review of Systems  Constitutional:  Negative for chills, diaphoresis, fever, malaise/fatigue and weight loss.  HENT:  Negative for nosebleeds and sore throat.   Eyes:  Negative for double vision.  Respiratory:  Positive for cough. Negative for hemoptysis, sputum production, shortness of breath and wheezing.   Cardiovascular:  Negative for chest pain, palpitations, orthopnea and leg swelling.  Gastrointestinal:  Negative for abdominal pain, blood in stool, constipation, diarrhea, heartburn, melena, nausea and vomiting.  Genitourinary:  Negative for dysuria, frequency and urgency.  Musculoskeletal:  Positive for back pain and joint pain.  Skin: Negative.  Negative for itching and rash.  Neurological:  Negative for dizziness, tingling, focal weakness, weakness  and headaches.  Endo/Heme/Allergies:  Does not bruise/bleed easily.  Psychiatric/Behavioral:  Negative for depression. The patient is not nervous/anxious and does not have insomnia.     MEDICAL HISTORY:  Past Medical History:  Diagnosis Date   BRCA negative 2013   Breast cancer (HCC) 2005   left breast ca   Breast cancer (HCC) 2006, 1999   right breast ca   COPD (chronic obstructive pulmonary disease) (HCC)    Mass of right lung    Multiple lung nodules    Personal history of chemotherapy    2006 rt breast ca   Personal history of radiation therapy    PONV (postoperative nausea and vomiting)    RBBB (right bundle branch block)    SURGICAL HISTORY: Past Surgical History:  Procedure Laterality Date   BREAST BIOPSY Left 2019    ADENOSIS, SKELETAL MUSCLE of LEFT breast   BREAST BIOPSY Left 10/23/2022   Left Breast Stereo Bx, X clip - path pending   BREAST BIOPSY Left 10/23/2022   MM LT BREAST BX W LOC DEV 1ST LESION IMAGE BX SPEC STEREO GUIDE 10/23/2022  [facility]   BREAST CYST ASPIRATION Bilateral    BREAST EXCISIONAL BIOPSY Right 1999   rad breast ca   BREAST EXCISIONAL BIOPSY Left 2005   rad breast ca   BREAST EXCISIONAL BIOPSY Right 2006   mastectomy chemo    BREAST LUMPECTOMY Left 2005   1999 right lumpectomy   CHOLECYSTECTOMY  2002   COLONOSCOPY  2009   COLONOSCOPY WITH PROPOFOL  N/A 09/03/2018   Procedure: COLONOSCOPY WITH PROPOFOL ;  Surgeon: Unk Corinn Skiff, MD;  Location: [facility];  Service: Gastroenterology;  Laterality: N/A;   ENDOBRONCHIAL ULTRASOUND Bilateral 02/16/2024   Procedure: ENDOBRONCHIAL ULTRASOUND (EBUS);  Surgeon: Tamea Dedra CROME, MD;  Location: [facility];  Service: Pulmonary;  Laterality: Bilateral;   ESOPHAGOGASTRODUODENOSCOPY (EGD) WITH PROPOFOL  N/A 09/03/2018   Procedure: ESOPHAGOGASTRODUODENOSCOPY (EGD) WITH PROPOFOL ;  Surgeon: Unk Corinn Skiff, MD;  Location: [facility];  Service: Gastroenterology;  Laterality: N/A;   IR IMAGING GUIDED PORT INSERTION  03/04/2024   MASTECTOMY Right 2006   MASTECTOMY W/ SENTINEL NODE BIOPSY Left 11/13/2022   Procedure: MASTECTOMY WITH SENTINEL LYMPH NODE BIOPSY, total mastectomy;  Surgeon: Lane Shope, MD;  Location: [facility];  Service: General;  Laterality: Left;   VIDEO BRONCHOSCOPY WITH ENDOBRONCHIAL NAVIGATION Bilateral 02/16/2024   Procedure: VIDEO BRONCHOSCOPY WITH ENDOBRONCHIAL NAVIGATION;  Surgeon: Tamea Dedra CROME, MD;  Location: [facility];  Service: Pulmonary;  Laterality: Bilateral;   SOCIAL HISTORY: Social History   Socioeconomic History   Marital status: Widowed    Spouse name: Not on file   Number of children: Not on file   Years of education: Not on file   Highest education level: Not on file  Occupational History   Not on file  Tobacco Use   Smoking status: Every Day    Current packs/day: 0.50    Average packs/day: 1 pack/day for 45.8 years (45.7 ttl pk-yrs)    Types: Cigarettes    Start date: 1980    Passive exposure:  Past   Smokeless tobacco: Never  Vaping Use   Vaping status: Never Used  Substance and Sexual Activity   Alcohol use: No   Drug use: No   Sexual activity: Not on file  Other Topics Concern   Not on file  Social History Narrative   Not on file   Social Drivers of Health   Financial Resource Strain: Not on file  Food Insecurity: No  Food Insecurity (04/14/2024)   Hunger Vital Sign    Worried About Running Out of Food in the Last Year: Never true    Ran Out of Food in the Last Year: Never true  Transportation Needs: No Transportation Needs (04/14/2024)   PRAPARE - Administrator, Civil Service (Medical): No    Lack of Transportation (Non-Medical): No  Physical Activity: Not on file  Stress: Not on file  Social Connections: Not on file  Intimate Partner Violence: Not on file   FAMILY HISTORY: Family History  Problem Relation Age of Onset   Breast cancer Mother 18   Breast cancer Sister 69   Breast cancer Maternal Aunt 40   ALLERGIES:  is allergic to codeine and tape.  MEDICATIONS:  Current Outpatient Medications  Medication Sig Dispense Refill   acetaminophen  (TYLENOL ) 500 MG tablet Take 500 mg by mouth every 6 (six) hours as needed for mild pain (pain score 1-3) or moderate pain (pain score 4-6).     anastrozole  (ARIMIDEX ) 1 MG tablet TAKE 1 TABLET(1 MG) BY MOUTH DAILY 90 tablet 1   lidocaine -prilocaine  (EMLA ) cream Apply to affected area once 30 g 3   traMADol  (ULTRAM ) 50 MG tablet Take 1 tablet (50 mg total) by mouth every 6 (six) hours as needed. 30 tablet 0   triamcinolone  ointment (KENALOG ) 0.5 % Apply 1 Application topically 2 (two) times daily. 30 g 0   amoxicillin  (AMOXIL ) 875 MG tablet Take 1 tablet (875 mg total) by mouth 2 (two) times daily. (Patient not taking: Reported on 04/26/2024) 20 tablet 0   aspirin EC 81 MG tablet Take 81 mg by mouth at bedtime. Swallow whole. (Patient not taking: Reported on 04/26/2024)     omeprazole  (PRILOSEC) 40 MG capsule  Take 1 capsule (40 mg total) by mouth daily. (Patient not taking: Reported on 04/26/2024) 30 capsule 2   ondansetron  (ZOFRAN ) 8 MG tablet Take 1 tablet (8 mg total) by mouth every 8 (eight) hours as needed for nausea or vomiting. Start on the third day after chemotherapy. (Patient not taking: Reported on 04/26/2024) 30 tablet 1   ondansetron  (ZOFRAN -ODT) 4 MG disintegrating tablet Take 4 mg by mouth every 8 (eight) hours as needed. (Patient not taking: Reported on 04/26/2024)     prochlorperazine  (COMPAZINE ) 10 MG tablet Take 1 tablet (10 mg total) by mouth every 6 (six) hours as needed for nausea or vomiting. (Patient not taking: Reported on 04/26/2024) 30 tablet 1   No current facility-administered medications for this visit.     PHYSICAL EXAMINATION: Vitals:   04/26/24 0841  BP: 109/62  Pulse: 95  Resp: 20  Temp: 97.8 F (36.6 C)  SpO2: 100%   Filed Weights   04/26/24 0841  Weight: 79 lb (35.8 kg)   Physical Exam Vitals and nursing note reviewed.  HENT:     Head: Normocephalic and atraumatic.     Mouth/Throat:     Pharynx: Oropharynx is clear.  Eyes:     Extraocular Movements: Extraocular movements intact.     Pupils: Pupils are equal, round, and reactive to light.  Cardiovascular:     Rate and Rhythm: Normal rate and regular rhythm.  Pulmonary:     Comments: Decreased breath sounds bilaterally.  Abdominal:     Palpations: Abdomen is soft.  Musculoskeletal:        General: Normal range of motion.     Cervical back: Normal range of motion.  Skin:    General: Skin is warm.  Neurological:     General: No focal deficit present.     Mental Status: She is alert and oriented to person, place, and time.  Psychiatric:        Behavior: Behavior normal.        Judgment: Judgment normal.    LABORATORY DATA:  I have reviewed the data as listed Lab Results  Component Value Date   WBC 5.1 04/26/2024   HGB 11.2 (L) 04/26/2024   HCT 32.5 (L) 04/26/2024   MCV 90.5  04/26/2024   PLT 338 04/26/2024   Recent Labs    04/05/24 0819 04/12/24 0822 04/19/24 0833  NA 136 136 134*  K 4.3 4.3 4.0  CL 104 105 102  CO2 24 25 24   GLUCOSE 106* 104* 101*  BUN 14 18 18   CREATININE 0.51 0.45 0.53  CALCIUM 9.3 9.2 9.1  GFRNONAA >60 >60 >60  PROT 6.9 7.4 7.5  ALBUMIN 3.4* 3.4* 3.3*  AST 18 21 17   ALT 15 16 13   ALKPHOS 75 70 68  BILITOT 0.5 0.3 0.6     RADIOGRAPHIC STUDIES: I have personally reviewed the radiological images as listed and agreed with the findings in the report. No results found.   ASSESSMENT & PLAN:   # Primary cancer of the right lower lobe- August 2025 ([facility]) Dr. Tamea.  Positive for malignancy-squamous cell carcinoma.  CT right lower lobe 6.4 cm.  PET scan showed hypermetabolic right lower lobe mass and probable right hilar adenopathy.  Findings consistent with T2bN1M0, stage IIb primary bronchogenic carcinoma.  August 2025 brain MRI was negative.  Definitive weekly carbo-Taxol  with daily radiation was recommended.  She initiated on 03/15/2024 and plans to finish end of October.  # Labs today reviewed and acceptable for continuation of treatment.  Proceed with cycle 7 of 7 planned weekly carbo-Taxol  chemotherapy treatments.  She is currently scheduled to complete radiation on 04/30/2024. She has restaging CT scheduled for end of November.   # Today we discussed role of maintenance durvalumab every 4 weeks. Discussed immune related adverse events including possible pneumonitis, thyroid dysfunction that can be permanent, diarrhea, fatigue, serious infections including pneumonia. Overall, serious AEs resulted in discontinuation of the drug in approximately 15% of cases. Patient agrees and elects to proceed with durvalumab. Will perform CT for restaging end of November and she will follow-up with Dr. Rennie for discussion of results and to proceed with maintenance Durvalumab every 4 weeks for maintenance at that time.    # Dysphagia-new as  she has progressed through radiation and I suspect it is related to her radiation treatments.  I recommended that she start her previously prescribed omeprazole .  If ongoing symptoms she may benefit from sucralfate.   # Neck pain- x1 week- likely MSK- on Tylenol  not improved- recommend topical NSAIDs-/biofreeze- voltaren gel.    # Difficulty sleeping sec to steroids- dose steroid decreased- Improved.    # constipation- G-1-2- metamucil- dulclax-miralax prn.  Today we discussed optimizing her stool softeners.  I recommend that she take magnesium citrate to alleviate acute constipation then maintenance with MiraLAX 1-3 times a day and senna 2 tablets 1-2 times a day.   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    # Bone health- recommend BMD; but declines- recommend calcium 1200 mg/day;OFF vit D- high dose vit D- -[June 2025]- HIGH will rechcek vit D levels.    #  active smoker: < 1ppd; recommend quitting-    # IV ACCESS: s/p port placement.    weekly-  labs/port- MD  chemo- x 3 at  time- 10/6- NO MD PS-   # DISPOSITION: Chemo today Add 1 liter IV fluids Follow up per IS 12/1- port/lab, Dr Rennie, +/- durvalumab- la  No problem-specific Assessment & Plan notes found for this encounter.  All questions were answered. The patient/family knows to call the clinic with any problems, questions or concerns.    Gabriella KANDICE Dawn, NP 04/26/2024

## 2024-04-26 NOTE — Progress Notes (Signed)
 ON PATHWAY REGIMEN - Non-Small Cell Lung  No Change  Continue With Treatment as Ordered.  Original Decision Date/Time: 02/23/2024 12:34     A cycle is every 7 days, concurrent with RT:     Paclitaxel       Carboplatin    **Always confirm dose/schedule in your pharmacy ordering system**  Patient Characteristics: Preoperative or Nonsurgical Candidate (Clinical Staging), Stage IIA or Stage IIB (N0-1 only), Nonsurgical Candidate Therapeutic Status: Preoperative or Nonsurgical Candidate (Clinical Staging) AJCC T Category: cT2b AJCC N Category: cN1 AJCC M Category: cM0 AJCC 9 Stage Grouping: IIB Check here if patient was staged using an edition other than AJCC Staging 9th Edition: false Intent of Therapy: Curative Intent, Discussed with Patient

## 2024-04-26 NOTE — Patient Instructions (Signed)
 [facility] - A DEPT OF [facility]  Discharge Instructions: Thank you for choosing [facility] to provide your oncology and hematology care.  If you have a lab appointment with the Cancer Center, please go directly to the Cancer Center and check in at the registration area.  Wear comfortable clothing and clothing appropriate for easy access to any Portacath or PICC line.   We strive to give you quality time with your provider. You may need to reschedule your appointment if you arrive late (15 or more minutes).  Arriving late affects you and other patients whose appointments are after yours.  Also, if you miss three or more appointments without notifying the office, you may be dismissed from the clinic at the provider's discretion.      For prescription refill requests, have your pharmacy contact our office and allow 72 hours for refills to be completed.    Today you received the following chemotherapy and/or immunotherapy agents- taxol, carboplatin      To help prevent nausea and vomiting after your treatment, we encourage you to take your nausea medication as directed.  BELOW ARE SYMPTOMS THAT SHOULD BE REPORTED IMMEDIATELY: *FEVER GREATER THAN 100.4 F (38 C) OR HIGHER *CHILLS OR SWEATING *NAUSEA AND VOMITING THAT IS NOT CONTROLLED WITH YOUR NAUSEA MEDICATION *UNUSUAL SHORTNESS OF BREATH *UNUSUAL BRUISING OR BLEEDING *URINARY PROBLEMS (pain or burning when urinating, or frequent urination) *BOWEL PROBLEMS (unusual diarrhea, constipation, pain near the anus) TENDERNESS IN MOUTH AND THROAT WITH OR WITHOUT PRESENCE OF ULCERS (sore throat, sores in mouth, or a toothache) UNUSUAL RASH, SWELLING OR PAIN  UNUSUAL VAGINAL DISCHARGE OR ITCHING   Items with * indicate a potential emergency and should be followed up as soon as possible or go to the Emergency Department if any problems should occur.  Please show the CHEMOTHERAPY ALERT CARD or  IMMUNOTHERAPY ALERT CARD at check-in to the Emergency Department and triage nurse.  Should you have questions after your visit or need to cancel or reschedule your appointment, please contact [facility] - A DEPT OF [facility]  [phone] and follow the prompts.  Office hours are 8:00 a.m. to 4:30 p.m. Monday - Friday. Please note that voicemails left after 4:00 p.m. may not be returned until the following business day.  We are closed weekends and major holidays. You have access to a nurse at all times for urgent questions. Please call the main number to the clinic [phone] and follow the prompts.  For any non-urgent questions, you may also contact your provider using MyChart. We now offer e-Visits for anyone 36 and older to request care online for non-urgent symptoms. For details visit [email or web address].   Also download the MyChart app! Go to the app store, search "MyChart", open the app, select [place], and log in with your MyChart username and password.

## 2024-04-26 NOTE — Progress Notes (Signed)
 Patient has no concerns

## 2024-04-27 ENCOUNTER — Ambulatory Visit
Admission: RE | Admit: 2024-04-27 | Discharge: 2024-04-27 | Disposition: A | Discharge status: Home / Self Care | Source: Ambulatory Visit | Attending: Radiation Oncology | Admitting: Radiation Oncology

## 2024-04-27 ENCOUNTER — Other Ambulatory Visit: Payer: Self-pay

## 2024-04-27 DIAGNOSIS — C3431 Malignant neoplasm of lower lobe, right bronchus or lung: Secondary | ICD-10-CM | POA: Diagnosis not present

## 2024-04-27 LAB — RAD ONC ARIA SESSION SUMMARY
Course Elapsed Days: 42
Plan Fractions Treated to Date: 30
Plan Prescribed Dose Per Fraction: 2 Gy
Plan Total Fractions Prescribed: 33
Plan Total Prescribed Dose: 66 Gy
Reference Point Dosage Given to Date: 60 Gy
Reference Point Session Dosage Given: 2 Gy
Session Number: 30

## 2024-04-28 ENCOUNTER — Other Ambulatory Visit: Payer: Self-pay

## 2024-04-28 ENCOUNTER — Ambulatory Visit
Admission: RE | Admit: 2024-04-28 | Discharge: 2024-04-28 | Disposition: A | Discharge status: Home / Self Care | Source: Ambulatory Visit | Attending: Radiation Oncology | Admitting: Radiation Oncology

## 2024-04-28 DIAGNOSIS — C3431 Malignant neoplasm of lower lobe, right bronchus or lung: Secondary | ICD-10-CM | POA: Diagnosis not present

## 2024-04-28 LAB — RAD ONC ARIA SESSION SUMMARY
Course Elapsed Days: 43
Plan Fractions Treated to Date: 31
Plan Prescribed Dose Per Fraction: 2 Gy
Plan Total Fractions Prescribed: 33
Plan Total Prescribed Dose: 66 Gy
Reference Point Dosage Given to Date: 62 Gy
Reference Point Session Dosage Given: 2 Gy
Session Number: 31

## 2024-04-29 ENCOUNTER — Other Ambulatory Visit: Payer: Self-pay

## 2024-04-29 ENCOUNTER — Ambulatory Visit

## 2024-04-29 ENCOUNTER — Ambulatory Visit
Admission: RE | Admit: 2024-04-29 | Discharge: 2024-04-29 | Disposition: A | Source: Ambulatory Visit | Attending: Radiation Oncology | Admitting: Radiation Oncology

## 2024-04-29 DIAGNOSIS — C3431 Malignant neoplasm of lower lobe, right bronchus or lung: Secondary | ICD-10-CM | POA: Diagnosis not present

## 2024-04-29 LAB — RAD ONC ARIA SESSION SUMMARY
Course Elapsed Days: 44
Plan Fractions Treated to Date: 32
Plan Prescribed Dose Per Fraction: 2 Gy
Plan Total Fractions Prescribed: 33
Plan Total Prescribed Dose: 66 Gy
Reference Point Dosage Given to Date: 64 Gy
Reference Point Session Dosage Given: 2 Gy
Session Number: 32

## 2024-04-30 ENCOUNTER — Other Ambulatory Visit: Payer: Self-pay

## 2024-04-30 ENCOUNTER — Ambulatory Visit
Admission: RE | Admit: 2024-04-30 | Discharge: 2024-04-30 | Disposition: A | Discharge status: Home / Self Care | Source: Ambulatory Visit | Attending: Radiation Oncology | Admitting: Radiation Oncology

## 2024-04-30 DIAGNOSIS — C3431 Malignant neoplasm of lower lobe, right bronchus or lung: Secondary | ICD-10-CM | POA: Diagnosis not present

## 2024-04-30 LAB — RAD ONC ARIA SESSION SUMMARY
Course Elapsed Days: 45
Plan Fractions Treated to Date: 33
Plan Prescribed Dose Per Fraction: 2 Gy
Plan Total Fractions Prescribed: 33
Plan Total Prescribed Dose: 66 Gy
Reference Point Dosage Given to Date: 66 Gy
Reference Point Session Dosage Given: 2 Gy
Session Number: 33

## 2024-05-03 NOTE — Radiation Completion Notes (Signed)
 Patient Name: Gabriella Green, Gabriella Green MRN: [id number] Date of Birth: May 18, 1951 Referring Physician: EDRA ODELL CHARD, M.D. Date of Service: 2024-05-03 Radiation Oncologist: Marcey Penton, M.D. [facility]                             RADIATION ONCOLOGY END OF TREATMENT NOTE     Diagnosis: C34.31 Malignant neoplasm of lower lobe, right bronchus or lung Staging on 2022-10-30: Carcinoma of upper-outer quadrant of left breast in female, estrogen receptor positive (HCC) T=cT1a, N=cN0, M=cM0 Staging on 2024-02-23: Primary cancer of right lower lobe of lung (HCC) T=cT2b, N=cN1, M=cM0 Intent: Curative     HPI: Patient is a 73 year old female previously treated 18 years prior for invasive mammary carcinoma the left breast status post wide local excision and adjuvant radiation therapy.  She also underwent a right mastectomy in 2006 and has had bilateral mastectomies.  Her family doctor recently ordered a chest x-ray showing a right chest mass.  She was noted to have a right lower lobe mass CT scan was performed showing a 4.3 x 4.7 cm microlobulated mass in the right lower lobe consistent with malignancy.  PET/CT scan confirmed hypermetabolic right lower lobe mass and probable right hilar adenopathy consistent with a T2b N1 stage IIb primary bronchogenic carcinoma.  Patient underwent bronchoscopy with cytology positive for squamous cell carcinoma.  Patient had a brain MRI which is pending although on my initial review do not see evidence of metastatic disease.  She has been seen by medical oncology with recommendation for concurrent chemoradiation.  She is doing fairly well has a slight cough no hemoptysis chest tightness.  She is rather thin around 80 pounds and is slightly anorexic.      ==========DELIVERED PLANS==========  First Treatment Date: 2024-03-16 Last Treatment Date: 2024-04-30   Plan Name: Lung_R Site: Lung, Right Technique: IMRT Mode: Photon Dose Per Fraction: 2  Gy Prescribed Dose (Delivered / Prescribed): 66 Gy / 66 Gy Prescribed Fxs (Delivered / Prescribed): 33 / 33     ==========ON TREATMENT VISIT DATES========== 2024-03-16, 2024-03-23, 2024-03-30, 2024-04-06, 2024-04-13, 2024-04-20, 2024-04-27, 2024-04-29     ==========UPCOMING VISITS========== 06/09/2024 [facility] RAD ONCOLOGY FOLLOW UP 30 Penton Marcey, MD  06/07/2024 [facility] MED ONC INFUSION [facility]- MO INFUSION CHAIR 15  06/07/2024 [facility] MED ONC EST PT Rennie Cindy SAUNDERS, MD  06/07/2024 [facility] MED ONC INF PORT FLUSH W/LAB [facility]-PORT FLUSH  05/28/2024 [facility] [place] CT CT CHEST W CONTRAST [facility] [place] CT 1  05/20/2024 [facility] SURGICAL FOLLOW UP 15 Marinda Jayson KIDD, MD        ==========APPENDIX - ON TREATMENT VISIT NOTES==========   See weekly On Treatment Notes in Epic for details in the Media tab (listed as Progress notes on the On Treatment Visit Dates listed above).

## 2024-05-07 ENCOUNTER — Encounter: Payer: Self-pay | Admitting: Internal Medicine

## 2024-05-20 ENCOUNTER — Ambulatory Visit: Admitting: General Surgery

## 2024-05-21 ENCOUNTER — Telehealth: Payer: Self-pay | Admitting: Internal Medicine

## 2024-05-21 NOTE — Telephone Encounter (Signed)
 Called pt to make sure CT appt was still good - pt verified that appt date/time/location still work for her - [facility]

## 2024-05-25 ENCOUNTER — Telehealth: Payer: Self-pay | Admitting: Internal Medicine

## 2024-05-25 NOTE — Telephone Encounter (Signed)
 Pt called to confirm 12/1 appts - confirmed appts - LH

## 2024-05-28 ENCOUNTER — Ambulatory Visit
Admission: RE | Admit: 2024-05-28 | Discharge: 2024-05-28 | Disposition: A | Discharge status: Home / Self Care | Source: Ambulatory Visit | Attending: Internal Medicine | Admitting: Internal Medicine

## 2024-05-28 ENCOUNTER — Encounter: Payer: Self-pay | Admitting: Internal Medicine

## 2024-05-28 ENCOUNTER — Other Ambulatory Visit

## 2024-05-28 DIAGNOSIS — C3431 Malignant neoplasm of lower lobe, right bronchus or lung: Secondary | ICD-10-CM

## 2024-05-28 MED ORDER — SODIUM CHLORIDE 0.9% FLUSH
10.0000 mL | INTRAVENOUS | Status: DC | PRN
  Administered 2024-05-28: 10 mL via INTRAVENOUS

## 2024-05-28 MED ORDER — HEPARIN SOD (PORK) LOCK FLUSH 100 UNIT/ML IV SOLN
500.0000 [IU] | Freq: Once | INTRAVENOUS | Status: AC
  Administered 2024-05-28: 500 [IU] via INTRAVENOUS

## 2024-05-28 MED ORDER — IOPAMIDOL (ISOVUE-300) INJECTION 61%
75.0000 mL | Freq: Once | INTRAVENOUS | Status: AC | PRN
Start: 2024-05-28 — End: 2024-05-28
  Administered 2024-05-28: 75 mL via INTRAVENOUS

## 2024-05-31 NOTE — Progress Notes (Signed)
 Pharmacist Chemotherapy Monitoring - Initial Assessment    Anticipated start date: 06/07/24   The following has been reviewed per standard work regarding the patient's treatment regimen: The patient's diagnosis, treatment plan and drug doses, and organ/hematologic function Lab orders and baseline tests specific to treatment regimen  The treatment plan start date, drug sequencing, and pre-medications Prior authorization status  Patient's documented medication list, including drug-drug interaction screen and prescriptions for anti-emetics and supportive care specific to the treatment regimen The drug concentrations, fluid compatibility, administration routes, and timing of the medications to be used The patient's access for treatment and lifetime cumulative dose history, if applicable  The patient's medication allergies and previous infusion related reactions, if applicable     Radiation and chemo completed 10/20 Dr B to begin maintenance durvalumab  Follow up needed:  Md to access starting durvalumab    Angeliyah Kirkey J Mabel Roll, RPH, 05/31/2024  12:22 PM

## 2024-06-01 ENCOUNTER — Other Ambulatory Visit: Payer: Self-pay | Admitting: Internal Medicine

## 2024-06-07 ENCOUNTER — Inpatient Hospital Stay

## 2024-06-07 ENCOUNTER — Inpatient Hospital Stay: Admitting: Internal Medicine

## 2024-06-07 ENCOUNTER — Other Ambulatory Visit: Payer: Self-pay | Admitting: Internal Medicine

## 2024-06-08 ENCOUNTER — Other Ambulatory Visit: Payer: Self-pay

## 2024-06-08 ENCOUNTER — Ambulatory Visit: Admitting: Internal Medicine

## 2024-06-08 ENCOUNTER — Other Ambulatory Visit

## 2024-06-09 ENCOUNTER — Ambulatory Visit
Admission: RE | Admit: 2024-06-09 | Discharge: 2024-06-09 | Disposition: A | Source: Ambulatory Visit | Attending: Radiation Oncology | Admitting: Radiation Oncology

## 2024-06-09 ENCOUNTER — Other Ambulatory Visit: Payer: Self-pay | Admitting: *Deleted

## 2024-06-09 ENCOUNTER — Telehealth: Payer: Self-pay | Admitting: Radiation Oncology

## 2024-06-09 VITALS — BP 143/76 | HR 102 | Temp 97.5°F | Resp 16 | Ht 61.0 in | Wt 78.8 lb

## 2024-06-09 DIAGNOSIS — C3431 Malignant neoplasm of lower lobe, right bronchus or lung: Secondary | ICD-10-CM

## 2024-06-09 NOTE — Telephone Encounter (Signed)
 Called pt to sched CT - left vm for call back - LH

## 2024-06-09 NOTE — Progress Notes (Signed)
 Radiation Oncology Follow up Note  Name: Gabriella Green   Date:   06/09/2024 MRN:  [id number] DOB: 1950-12-07    This 73 y.o. female presents to the clinic today for 1 month follow-up status post.  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  Her clinical stage with (cT2b N1 M0).  She is seen today in routine follow-up she states she is gaining her strength.  She is have knowing significant cough hemoptysis any change in her pulmonary status.  She continues to have problems with weight gain although she states she is eating well.  Medical oncology ordered a CT scan already showing reduced size and increased cavitation of the right lower lobe mass.  No evidence of metastatic disease.  COMPLICATIONS OF TREATMENT: none  FOLLOW UP COMPLIANCE: keeps appointments   PHYSICAL EXAM:  BP (!) 143/76   Pulse (!) 102   Temp (!) 97.5 F (36.4 C) (Tympanic)   Resp 16   Ht 5' 1 (1.549 m)   Wt 78 lb 12.8 oz (35.7 kg)   BMI 14.89 kg/m  Well-developed well-nourished patient in NAD. HEENT reveals PERLA, EOMI, discs not visualized.  Oral cavity is clear. No oral mucosal lesions are identified. Neck is clear without evidence of cervical or supraclavicular adenopathy. Lungs are clear to A&P. Cardiac examination is essentially unremarkable with regular rate and rhythm without murmur rub or thrill. Abdomen is benign with no organomegaly or masses noted. Motor sensory and DTR levels are equal and symmetric in the upper and lower extremities. Cranial nerves II through XII are grossly intact. Proprioception is intact. No peripheral adenopathy or edema is identified. No motor or sensory levels are noted. Crude visual fields are within normal  range.  RADIOLOGY RESULTS: CT scan reviewed compatible with above-stated findings  PLAN: Present time patient clinically is doing well CT scan is done rather prematurely for total appreciation of overall response.  Of asked to see her back in 4 months with a repeat CT scan of her chest at that time.  Patient continues close follow-up care with medical oncology.  Patient knows to call with any concerns.  I would like to take this opportunity to thank you for allowing me to participate in the care of your patient.SABRA Marcey Penton, MD

## 2024-06-10 ENCOUNTER — Encounter: Payer: Self-pay | Admitting: Radiation Oncology

## 2024-06-10 ENCOUNTER — Telehealth: Payer: Self-pay | Admitting: Radiation Oncology

## 2024-06-10 ENCOUNTER — Other Ambulatory Visit: Payer: Self-pay

## 2024-06-10 NOTE — Telephone Encounter (Signed)
 Called pt to sched CT - pt confirmed date/time/location - req appt reminders via mail for CT and MD follow up - [facility]

## 2024-06-17 ENCOUNTER — Encounter: Payer: Self-pay | Admitting: Internal Medicine

## 2024-06-17 ENCOUNTER — Inpatient Hospital Stay

## 2024-06-17 ENCOUNTER — Encounter: Payer: Self-pay | Admitting: Nurse Practitioner

## 2024-06-17 ENCOUNTER — Ambulatory Visit: Payer: Self-pay | Admitting: Nurse Practitioner

## 2024-06-17 ENCOUNTER — Inpatient Hospital Stay: Attending: Internal Medicine | Admitting: Nurse Practitioner

## 2024-06-17 ENCOUNTER — Inpatient Hospital Stay: Attending: Internal Medicine

## 2024-06-17 VITALS — BP 134/52 | HR 75 | Temp 98.9°F | Resp 19 | Ht 61.0 in | Wt 80.5 lb

## 2024-06-17 VITALS — BP 103/58 | HR 86 | Temp 99.8°F

## 2024-06-17 DIAGNOSIS — Z170 Estrogen receptor positive status [ER+]: Secondary | ICD-10-CM | POA: Diagnosis not present

## 2024-06-17 DIAGNOSIS — Z5112 Encounter for antineoplastic immunotherapy: Secondary | ICD-10-CM | POA: Diagnosis present

## 2024-06-17 DIAGNOSIS — Z79811 Long term (current) use of aromatase inhibitors: Secondary | ICD-10-CM | POA: Insufficient documentation

## 2024-06-17 DIAGNOSIS — C3431 Malignant neoplasm of lower lobe, right bronchus or lung: Secondary | ICD-10-CM

## 2024-06-17 DIAGNOSIS — Z79620 Long term (current) use of immunosuppressive biologic: Secondary | ICD-10-CM | POA: Diagnosis not present

## 2024-06-17 DIAGNOSIS — M549 Dorsalgia, unspecified: Secondary | ICD-10-CM | POA: Insufficient documentation

## 2024-06-17 DIAGNOSIS — Z9013 Acquired absence of bilateral breasts and nipples: Secondary | ICD-10-CM | POA: Insufficient documentation

## 2024-06-17 DIAGNOSIS — R8290 Unspecified abnormal findings in urine: Secondary | ICD-10-CM | POA: Diagnosis not present

## 2024-06-17 DIAGNOSIS — F17200 Nicotine dependence, unspecified, uncomplicated: Secondary | ICD-10-CM

## 2024-06-17 DIAGNOSIS — C50412 Malignant neoplasm of upper-outer quadrant of left female breast: Secondary | ICD-10-CM | POA: Diagnosis present

## 2024-06-17 DIAGNOSIS — K5909 Other constipation: Secondary | ICD-10-CM | POA: Diagnosis not present

## 2024-06-17 DIAGNOSIS — F17210 Nicotine dependence, cigarettes, uncomplicated: Secondary | ICD-10-CM | POA: Insufficient documentation

## 2024-06-17 DIAGNOSIS — M2550 Pain in unspecified joint: Secondary | ICD-10-CM | POA: Insufficient documentation

## 2024-06-17 LAB — URINALYSIS, COMPLETE (UACMP) WITH MICROSCOPIC
Bacteria, UA: NONE SEEN
Bilirubin Urine: NEGATIVE
Glucose, UA: NEGATIVE mg/dL
Hgb urine dipstick: NEGATIVE
Ketones, ur: NEGATIVE mg/dL
Leukocytes,Ua: NEGATIVE
Nitrite: NEGATIVE
Protein, ur: NEGATIVE mg/dL
Specific Gravity, Urine: 1.017 (ref 1.005–1.030)
Squamous Epithelial / HPF: 0 /HPF (ref 0–5)
pH: 6 (ref 5.0–8.0)

## 2024-06-17 LAB — CBC WITH DIFFERENTIAL (CANCER CENTER ONLY)
Abs Immature Granulocytes: 0.01 K/uL (ref 0.00–0.07)
Basophils Absolute: 0 K/uL (ref 0.0–0.1)
Basophils Relative: 1 %
Eosinophils Absolute: 0.1 K/uL (ref 0.0–0.5)
Eosinophils Relative: 1 %
HCT: 35.4 % — ABNORMAL LOW (ref 36.0–46.0)
Hemoglobin: 11.9 g/dL — ABNORMAL LOW (ref 12.0–15.0)
Immature Granulocytes: 0 %
Lymphocytes Relative: 14 %
Lymphs Abs: 0.8 K/uL (ref 0.7–4.0)
MCH: 32.5 pg (ref 26.0–34.0)
MCHC: 33.6 g/dL (ref 30.0–36.0)
MCV: 96.7 fL (ref 80.0–100.0)
Monocytes Absolute: 0.6 K/uL (ref 0.1–1.0)
Monocytes Relative: 12 %
Neutro Abs: 3.9 K/uL (ref 1.7–7.7)
Neutrophils Relative %: 72 %
Platelet Count: 295 K/uL (ref 150–400)
RBC: 3.66 MIL/uL — ABNORMAL LOW (ref 3.87–5.11)
RDW: 16.9 % — ABNORMAL HIGH (ref 11.5–15.5)
WBC Count: 5.4 K/uL (ref 4.0–10.5)
nRBC: 0 % (ref 0.0–0.2)

## 2024-06-17 LAB — CMP (CANCER CENTER ONLY)
ALT: 9 U/L (ref 0–44)
AST: 21 U/L (ref 15–41)
Albumin: 4.1 g/dL (ref 3.5–5.0)
Alkaline Phosphatase: 77 U/L (ref 38–126)
Anion gap: 11 (ref 5–15)
BUN: 12 mg/dL (ref 8–23)
CO2: 23 mmol/L (ref 22–32)
Calcium: 9.6 mg/dL (ref 8.9–10.3)
Chloride: 105 mmol/L (ref 98–111)
Creatinine: 0.59 mg/dL (ref 0.44–1.00)
GFR, Estimated: >60 mL/min (ref >60)
Glucose, Bld: 111 mg/dL — ABNORMAL HIGH (ref 70–99)
Potassium: 4.1 mmol/L (ref 3.5–5.1)
Sodium: 139 mmol/L (ref 135–145)
Total Bilirubin: 0.3 mg/dL (ref 0.0–1.2)
Total Protein: 7.3 g/dL (ref 6.5–8.1)

## 2024-06-17 LAB — TSH: TSH: 0.418 u[IU]/mL (ref 0.350–4.500)

## 2024-06-17 MED ORDER — VARENICLINE TARTRATE 0.5 MG PO TABS: ORAL_TABLET | ORAL | 0 refills | Status: AC

## 2024-06-17 MED ORDER — SODIUM CHLORIDE 0.9 % IV SOLN
1500.0000 mg | Freq: Once | INTRAVENOUS | Status: AC
  Administered 2024-06-17: 1500 mg via INTRAVENOUS
  Filled 2024-06-17: qty 30

## 2024-06-17 MED FILL — Sodium Chloride IV Soln 0.9%: INTRAVENOUS | Qty: 250 | Status: AC

## 2024-06-17 NOTE — Patient Instructions (Signed)
 You can reach out to our Smoking Cessation Team to schedule an appointment at: [email or web address]  Additional resources are available at: [phone]  Nicotine Replacement Therapy is also available to purchase at a discounted rate at any [facility]

## 2024-06-17 NOTE — Progress Notes (Signed)
 Patient states her immunotherapy has not been explained well to her & she is also wanting to know what can be done regarding gaining some weight. Also, she says her urine has an odor that started a couple of weeks ago.

## 2024-06-17 NOTE — Patient Instructions (Signed)
 [facility] - A DEPT OF [facility]  Discharge Instructions: Thank you for choosing [facility] to provide your oncology and hematology care.  If you have a lab appointment with the Cancer Center, please go directly to the Cancer Center and check in at the registration area.  Wear comfortable clothing and clothing appropriate for easy access to any Portacath or PICC line.   We strive to give you quality time with your provider. You may need to reschedule your appointment if you arrive late (15 or more minutes).  Arriving late affects you and other patients whose appointments are after yours.  Also, if you miss three or more appointments without notifying the office, you may be dismissed from the clinic at the provider's discretion.      For prescription refill requests, have your pharmacy contact our office and allow 72 hours for refills to be completed.    Today you received the following chemotherapy and/or immunotherapy agents Imfinzi      To help prevent nausea and vomiting after your treatment, we encourage you to take your nausea medication as directed.  BELOW ARE SYMPTOMS THAT SHOULD BE REPORTED IMMEDIATELY: *FEVER GREATER THAN 100.4 F (38 C) OR HIGHER *CHILLS OR SWEATING *NAUSEA AND VOMITING THAT IS NOT CONTROLLED WITH YOUR NAUSEA MEDICATION *UNUSUAL SHORTNESS OF BREATH *UNUSUAL BRUISING OR BLEEDING *URINARY PROBLEMS (pain or burning when urinating, or frequent urination) *BOWEL PROBLEMS (unusual diarrhea, constipation, pain near the anus) TENDERNESS IN MOUTH AND THROAT WITH OR WITHOUT PRESENCE OF ULCERS (sore throat, sores in mouth, or a toothache) UNUSUAL RASH, SWELLING OR PAIN  UNUSUAL VAGINAL DISCHARGE OR ITCHING   Items with * indicate a potential emergency and should be followed up as soon as possible or go to the Emergency Department if any problems should occur.  Please show the CHEMOTHERAPY ALERT CARD or IMMUNOTHERAPY  ALERT CARD at check-in to the Emergency Department and triage nurse.  Should you have questions after your visit or need to cancel or reschedule your appointment, please contact [facility] - A DEPT OF [facility]  [phone] and follow the prompts.  Office hours are 8:00 a.m. to 4:30 p.m. Monday - Friday. Please note that voicemails left after 4:00 p.m. may not be returned until the following business day.  We are closed weekends and major holidays. You have access to a nurse at all times for urgent questions. Please call the main number to the clinic [phone] and follow the prompts.  For any non-urgent questions, you may also contact your provider using MyChart. We now offer e-Visits for anyone 1 and older to request care online for non-urgent symptoms. For details visit [email or web address].   Also download the MyChart app! Go to the app store, search "MyChart", open the app, select [place], and log in with your MyChart username and password.

## 2024-06-17 NOTE — Progress Notes (Signed)
 [facility] CONSULT NOTE  Patient Care Team: Odell Chard, Edra GRADE, MD as PCP - General (Family Medicine) Dellie Louanne MATSU, MD (General Surgery) Cindie Jesusa HERO, RN as Registered Nurse Georgina Shasta POUR, RN as Oncology Nurse Navigator Rennie Cindy SAUNDERS, MD as Consulting Physician (Oncology) Verdene Gills, RN as Oncology Nurse Navigator  CHIEF COMPLAINTS/PURPOSE OF CONSULTATION: Lung cancer/breast cancer  #  Oncology History Overview Note  BRCA negative 2013    Breast cancer [facility]) 2005    left breast ca   Breast cancer [facility]) 2006, 1999    right breast ca   Cancer [facility]) [phone]   # Left breast- 1999 mild ducts- s/p lumpectomy; s/p RT- no chemo [Dr.Sankar/Dr.Byrnett]  # 2006 ? Stage- s/p right mastectomy s/p chemo [Dr.Choksi]; Dr.Chrystal     DIAGNOSIS: A.  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 Size of invasive carcinoma: 2 mm Mm in this sample Histologic grade of invasive carcinoma: Grade 2                      Glandular/tubular differentiation score: 3                      Nuclear pleomorphism score: 3                      Mitotic rate score: 1                      Total score: 7 Ductal carcinoma in situ: Present, high-grade comedo type with associated calcifications Lymphovascular invasion: Not identified  Comment: The definitive grade will be assigned on the excisional specimen. ER/PR/HER2: Immunohistochemistry will be performed on block A2, with reflex to FISH for HER2 2+. The results will be reported in an addendum.  GROSS DESCRIPTION: A. Labeled: Left breast stereo biopsy calcs upper outer quadrant mid depth Received: in a formalin-filled Brevera collection device Specimen radiograph image(s) available for review Time/Date in fixative: Collected at 10:58 AM on 10/23/2022 and placed in formalin at 11:01 AM on 10/23/2022 Cold ischemic time: Less  than 5 minutes Total fixation time: Approximately 9.25 hours Core pieces: Multiple Measurement: Aggregate, 8.5 x 1.5 x 0.3 cm Description / comments: Received are cores and fragments of yellow fibrofatty tissue.  The accompanying diagram has sections B and G checked. Inked: Green Entirely submitted in cassette(s):  1 - section B 2 - section G 3 - 7 - remaining tissue fragments      3 - sections A and C      4 - sections D and E      5 - sections F and H      6 - sections I and J      7 - sections K and L with remaining free-floating fragments  RB 10/23/2022  Final Diagnosis performed by Rexene Daily, MD.   Electronically signed 10/24/2022 10:40:25AM The electronic signature indicates that the named Attending Pathologist has evaluated the specimen Technical component performed at [facility], [address], [place], [place] [address] Lab: [phone] Dir: Frankey Sas, MD, MMM  Professional component performed at [facility], [facility], [address] [place], [place], [place] [address] Lab: [phone] Dir: Wilkie EMERSON Molt, MD  ADDENDUM: CASE SUMMARY: BREAST BIOMARKER TESTS Estrogen Receptor (ER) Status: POSITIVE         Percentage of cells with nuclear positivity: Greater than 90%  Average intensity of staining: Strong  Progesterone Receptor (PgR) Status: NEGATIVE (LESS THAN 1%)         Internal control cells present and stain as expected  HER2 (by immunohistochemistry): NEGATIVE (Score 1+)  Histologic Type: Invasive carcinoma of no special type (ductal) Histologic Grade (Nottingham Histologic Score)      Glandular (Acinar)/Tubular Differentiation: 3      Nuclear Pleomorphism: 3      Mitotic Rate: 3      Overall Grade: Grade 3 Tumor Size: At least 17 mm Tumor Focality: Single focus of invasive carcinoma Ductal Carcinoma In Situ (DCIS): Present      Positive for extensive intraductal component (EIC) Tumor Extent: Not applicable Lymphatic and/or  Vascular Invasion: Not identified  Treatment Effect in the Breast: No known presurgical therapy  MARGINS Margin Status for Invasive Carcinoma: All margins negative for invasive carcinoma   APRIL/MAY 2024-left breast invasive mammary carcinoma s/p mastectomy [Dr. Rodenberg]-at least pT1c [17 mm]; 2 sentinel lymph nodes negative; grade 3; Oncotype 35 high risk-declined chemotherapy.   # # AUG 2025-[ [facility]] Dr. Tamea. POSITIVE FOR MALIGNANCY- SQUAMOUS CELL CARCINOMA- CT RIGHT lower lobe, 6.4 cm greatest   PET scan:  Hypermetabolic right lower lobe mass and probable right hilar adenopathy, findings consistent with T2 be N1 M0 or stage II B primary bronchogenic carcinoma.  AUG brain MRI-NEG.   # Treatment: Definitive weekly carbotaxol with the daily radiation. [Start- 03/15/24]       Carcinoma of upper-outer quadrant of left breast in female, estrogen receptor positive (HCC)  10/30/2022 Initial Diagnosis   Carcinoma of upper-outer quadrant of left breast in female, estrogen receptor positive   10/30/2022 Cancer Staging   Staging form: Breast, AJCC 8th Edition - Clinical: Stage IA (cT1a, cN0, cM0, G2, ER+, PR-, HER2-) - Signed by Rennie Cindy SAUNDERS, MD on 10/30/2022 Histologic grading system: 3 grade system   12/05/2022 - 12/05/2022 Chemotherapy   Patient is on Treatment Plan : BREAST TC q21d     Primary cancer of right lower lobe of lung (HCC)  02/23/2024 Initial Diagnosis   Primary cancer of right lower lobe of lung (HCC)   02/23/2024 Cancer Staging   Staging form: Lung, AJCC V9 - Clinical: Stage IIB (cT2b, cN1, cM0) - Signed by Rennie Cindy SAUNDERS, MD on 02/23/2024   03/15/2024 - 04/26/2024 Chemotherapy   Patient is on Treatment Plan : LUNG Carboplatin  + Paclitaxel  + XRT q7d     06/17/2024 -  Chemotherapy   Patient is on Treatment Plan : LUNG NSCLC Durvalumab (1500) q28d      HISTORY OF PRESENTING ILLNESS: Patient ambulating-independently.  Alone.   Gabriella Green 73 y.o.  female patient with history of stage I breast cancer, on anastrozole , and stage IIb right lower lobe lung cancer, who completed concurrent chemotherapy and radiation on 04/26/2024.  In the interim, she has had a restaging CT chest and presents today for consideration of initiation of Durvalumab.  She unfortunately continues to smoke half a pack a day but says she is interested in quitting.  She is getting stronger since completing chemo and radiation.  Has persistent cough.  Review of Systems  Constitutional:  Negative for chills, diaphoresis, fever, malaise/fatigue and weight loss.  HENT:  Negative for nosebleeds and sore throat.   Eyes:  Negative for double vision.  Respiratory:  Positive for cough. Negative for hemoptysis, sputum production, shortness of breath and wheezing.   Cardiovascular:  Negative for chest pain, palpitations, orthopnea and leg swelling.  Gastrointestinal:  Negative for abdominal pain, blood in stool, constipation, diarrhea, heartburn, melena, nausea and vomiting.  Genitourinary:  Negative for dysuria, frequency and urgency.  Musculoskeletal:  Positive for back pain and joint pain.  Skin:  Negative for itching and rash.  Neurological:  Negative for dizziness, tingling, focal weakness, weakness and headaches.  Endo/Heme/Allergies:  Does not bruise/bleed easily.  Psychiatric/Behavioral:  Negative for depression. The patient is not nervous/anxious and does not have insomnia.     MEDICAL HISTORY:  Past Medical History:  Diagnosis Date   BRCA negative 2013   Breast cancer (HCC) 2005   left breast ca   Breast cancer (HCC) 2006, 1999   right breast ca   COPD (chronic obstructive pulmonary disease) (HCC)    Mass of right lung    Multiple lung nodules    Personal history of chemotherapy    2006 rt breast ca   Personal history of radiation therapy    PONV (postoperative nausea and vomiting)    RBBB (right bundle branch block)    SURGICAL HISTORY: Past Surgical  History:  Procedure Laterality Date   BREAST BIOPSY Left 2019    ADENOSIS, SKELETAL MUSCLE of LEFT breast   BREAST BIOPSY Left 10/23/2022   Left Breast Stereo Bx, X clip - path pending   BREAST BIOPSY Left 10/23/2022   MM LT BREAST BX W LOC DEV 1ST LESION IMAGE BX SPEC STEREO GUIDE 10/23/2022 [facility]   BREAST CYST ASPIRATION Bilateral    BREAST EXCISIONAL BIOPSY Right 1999   rad breast ca   BREAST EXCISIONAL BIOPSY Left 2005   rad breast ca   BREAST EXCISIONAL BIOPSY Right 2006   mastectomy chemo    BREAST LUMPECTOMY Left 2005   1999 right lumpectomy   CHOLECYSTECTOMY  2002   COLONOSCOPY  2009   COLONOSCOPY WITH PROPOFOL  N/A 09/03/2018   Procedure: COLONOSCOPY WITH PROPOFOL ;  Surgeon: Unk Corinn Skiff, MD;  Location: [facility];  Service: Gastroenterology;  Laterality: N/A;   ENDOBRONCHIAL ULTRASOUND Bilateral 02/16/2024   Procedure: ENDOBRONCHIAL ULTRASOUND (EBUS);  Surgeon: Tamea Dedra CROME, MD;  Location: [facility];  Service: Pulmonary;  Laterality: Bilateral;   ESOPHAGOGASTRODUODENOSCOPY (EGD) WITH PROPOFOL  N/A 09/03/2018   Procedure: ESOPHAGOGASTRODUODENOSCOPY (EGD) WITH PROPOFOL ;  Surgeon: Unk Corinn Skiff, MD;  Location: [facility];  Service: Gastroenterology;  Laterality: N/A;   IR IMAGING GUIDED PORT INSERTION  03/04/2024   MASTECTOMY Right 2006   MASTECTOMY W/ SENTINEL NODE BIOPSY Left 11/13/2022   Procedure: MASTECTOMY WITH SENTINEL LYMPH NODE BIOPSY, total mastectomy;  Surgeon: Lane Shope, MD;  Location: [facility];  Service: General;  Laterality: Left;   VIDEO BRONCHOSCOPY WITH ENDOBRONCHIAL NAVIGATION Bilateral 02/16/2024   Procedure: VIDEO BRONCHOSCOPY WITH ENDOBRONCHIAL NAVIGATION;  Surgeon: Tamea Dedra CROME, MD;  Location: [facility];  Service: Pulmonary;  Laterality: Bilateral;   SOCIAL HISTORY: Social History   Socioeconomic History   Marital status: Widowed    Spouse name: Not on file   Number of children: Not on file   Years of  education: Not on file   Highest education level: Not on file  Occupational History   Not on file  Tobacco Use   Smoking status: Every Day    Current packs/day: 0.50    Average packs/day: 1 pack/day for 45.9 years (45.8 ttl pk-yrs)    Types: Cigarettes    Start date: 1980    Passive exposure: Past   Smokeless tobacco: Never  Vaping Use   Vaping status: Never Used  Substance and Sexual Activity  Alcohol use: No   Drug use: No   Sexual activity: Not on file  Other Topics Concern   Not on file  Social History Narrative   Not on file   Social Drivers of Health   Tobacco Use: High Risk (06/17/2024)   Patient History    Smoking Tobacco Use: Every Day    Smokeless Tobacco Use: Never    Passive Exposure: Past  Financial Resource Strain: Not on file  Food Insecurity: No Food Insecurity (04/14/2024)   Epic    Worried About Programme Researcher, Broadcasting/film/video in the Last Year: Never true    Ran Out of Food in the Last Year: Never true  Transportation Needs: No Transportation Needs (04/14/2024)   Epic    Lack of Transportation (Medical): No    Lack of Transportation (Non-Medical): No  Physical Activity: Not on file  Stress: Not on file  Social Connections: Not on file  Intimate Partner Violence: Not on file  Depression (PHQ2-9): Low Risk (06/17/2024)   Depression (PHQ2-9)    PHQ-2 Score: 0  Alcohol Screen: Low Risk (04/14/2024)   Alcohol Screen    Last Alcohol Screening Score (AUDIT): 0  Housing: Low Risk (04/14/2024)   Epic    Unable to Pay for Housing in the Last Year: No    Number of Times Moved in the Last Year: 0    Homeless in the Last Year: No  Utilities: Not At Risk (04/14/2024)   Epic    Threatened with loss of utilities: No  Health Literacy: Not on file   FAMILY HISTORY: Family History  Problem Relation Age of Onset   Breast cancer Mother 35   Breast cancer Sister 29   Breast cancer Maternal Aunt 40   ALLERGIES:  is allergic to codeine and tape.  MEDICATIONS:  Current  Outpatient Medications  Medication Sig Dispense Refill   acetaminophen  (TYLENOL ) 500 MG tablet Take 500 mg by mouth every 6 (six) hours as needed for mild pain (pain score 1-3) or moderate pain (pain score 4-6).     anastrozole  (ARIMIDEX ) 1 MG tablet TAKE 1 TABLET(1 MG) BY MOUTH DAILY 90 tablet 1   pantoprazole (PROTONIX) 40 MG tablet As needed     traMADol  (ULTRAM ) 50 MG tablet Take 1 tablet (50 mg total) by mouth every 6 (six) hours as needed. 30 tablet 0   triamcinolone  ointment (KENALOG ) 0.5 % Apply 1 Application topically 2 (two) times daily. 30 g 0   varenicline (CHANTIX) 0.5 MG tablet Days 1 to 3- 0.5 mg once daily,Days 4 to 7-0.5 mg twice daily,Maintenance day 8 and later 1 mg twice daily- may consider a temporary or permanent dose reduction if usual dose is not tolerated 60 tablet 0   amoxicillin  (AMOXIL ) 875 MG tablet Take 1 tablet (875 mg total) by mouth 2 (two) times daily. (Patient not taking: Reported on 04/26/2024) 20 tablet 0   aspirin EC 81 MG tablet Take 81 mg by mouth at bedtime. Swallow whole. (Patient not taking: Reported on 04/26/2024)     omeprazole  (PRILOSEC) 40 MG capsule Take 1 capsule (40 mg total) by mouth daily. (Patient not taking: Reported on 04/26/2024) 30 capsule 2   ondansetron  (ZOFRAN -ODT) 4 MG disintegrating tablet Take 4 mg by mouth every 8 (eight) hours as needed. (Patient not taking: Reported on 04/26/2024)     No current facility-administered medications for this visit.   Facility-Administered Medications Ordered in Other Visits  Medication Dose Route Frequency Provider Last Rate Last Admin  0.9 %  sodium chloride  infusion   Intravenous Continuous Brahmanday, Govinda R, MD   Stopped at 06/17/24 1324     PHYSICAL EXAMINATION: Vitals:   06/17/24 1031  BP: (!) 134/52  Pulse: 75  Resp: 19  Temp: 98.9 F (37.2 C)  SpO2: 98%   Filed Weights   06/17/24 1031  Weight: 80 lb 8 oz (36.5 kg)   Physical Exam Vitals reviewed.  Constitutional:       Appearance: She is not ill-appearing.  HENT:     Head: Normocephalic and atraumatic.     Mouth/Throat:     Pharynx: Oropharynx is clear.  Cardiovascular:     Rate and Rhythm: Normal rate and regular rhythm.  Pulmonary:     Comments: Decreased breath sounds bilaterally.  Abdominal:     General: There is no distension.     Palpations: Abdomen is soft.  Skin:    General: Skin is warm.     Coloration: Skin is not pale.     Findings: No rash.  Neurological:     General: No focal deficit present.     Mental Status: She is alert and oriented to person, place, and time.  Psychiatric:        Mood and Affect: Mood normal.        Behavior: Behavior normal.    LABORATORY DATA:  I have reviewed the data as listed Lab Results  Component Value Date   WBC 5.4 06/17/2024   HGB 11.9 (L) 06/17/2024   HCT 35.4 (L) 06/17/2024   MCV 96.7 06/17/2024   PLT 295 06/17/2024   Recent Labs    04/19/24 0833 04/26/24 0807 06/17/24 1009  NA 134* 136 139  K 4.0 4.0 4.1  CL 102 104 105  CO2 24 23 23   GLUCOSE 101* 101* 111*  BUN 18 12 12   CREATININE 0.53 0.55 0.59  CALCIUM 9.1 9.1 9.6  GFRNONAA >60 >60 >60  PROT 7.5 7.4 7.3  ALBUMIN 3.3* 3.3* 4.1  AST 17 16 21   ALT 13 11 9   ALKPHOS 68 70 77  BILITOT 0.6 0.6 0.3   Lab Results  Component Value Date   TSH 0.418 06/17/2024     RADIOGRAPHIC STUDIES: I have personally reviewed the radiological images as listed and agreed with the findings in the report. CT CHEST W CONTRAST Result Date: 06/01/2024 EXAM: CT CHEST WITH CONTRAST 05/28/2024 10:49:44 AM TECHNIQUE: CT of the chest was performed with the administration of 75 mL iopamidol  (ISOVUE -300) 61% injection. Multiplanar reformatted images are provided for review. Automated exposure control, iterative reconstruction, and/or weight based adjustment of the mA/kV was utilized to reduce the radiation dose to as low as reasonably achievable. COMPARISON: Multiple exams including 02/13/2024. CLINICAL  HISTORY: lung cancer restaging * Tracking Code: BO * FINDINGS: MEDIASTINUM: Heart and pericardium are unremarkable. Aortic arch atherosclerotic vascular calcification. Left sided port-a-cath tip in the right atrium. The central airways are clear. LYMPH NODES: No current pathologically enlarged adenopathy. LUNGS AND PLEURA: Emphysema. Chronic biapical pleuroparenchymal scarring. Increased cavitation of the right lower lobe mass currently measuring 3.7 x 2.5 cm and formerly measuring 4.8 x 3.3 cm back on 02/13/2024. Two stable 3 mm nodules in the left upper lobe. No pulmonary edema. No pleural effusion or pneumothorax. SOFT TISSUES/BONES: Mild thoracic spondylosis. No acute abnormality of the soft tissues. UPPER ABDOMEN: Limited images of the upper abdomen demonstrate fullness of the adrenal glands without discrete mass. IMPRESSION: 1. Reduce size and increased cavitation of the right lower  lobe mass, currently measuring 3.7 x 2.5 cm and previously 4.8 x 3.3 cm on 02/13/24. 2. No evidence of metastatic disease. 3. Stable 3 mm nodules in the left upper lobe without change, recommend continued surveillance. 4. Mild thoracic spondylosis. 5. Atherosclerosis and emphysema. Electronically signed by: Ryan Salvage MD 06/01/2024 10:41 AM EST RP Workstation: [id number]     ASSESSMENT & PLAN:   # Primary cancer of the right lower lobe- August 2025 ([facility]) Dr. Tamea.  Positive for malignancy-squamous cell carcinoma.  CT right lower lobe 6.4 cm.  PET scan showed hypermetabolic right lower lobe mass and probable right hilar adenopathy.  Findings consistent with T2bN1M0, stage IIb primary bronchogenic carcinoma.  August 2025 brain MRI was negative.  Definitive weekly carbo-Taxol  with daily radiation was recommended.  She initiated on 03/15/2024 and plans to finish end of October.  # Labs today reviewed and acceptable for continuation of treatment.  Proceed with cycle 7 of 7 planned weekly carbo-Taxol  chemotherapy  treatments.  She is currently scheduled to complete radiation on 04/30/2024. She has restaging CT scheduled for end of November.   # I have independently reviewed CT chest with contrast from 05/28/2024 and agree with findings.  Scan was also reviewed by Dr. Rennie who independently discussed results with the patient today.  The mass of her right lower lobe has reduced in size now measuring 3.7 x 2.5 cm.  Previously 4.8 x 3.3 cm.  No evidence of metastatic disease.  Stable 3 mm nodules in the left upper lobe that are unchanged.  Findings consistent with a response to treatment however, persistent malignancy.  # We again discussed the role of maintenance Durvalumab after her completion of chemo and therapy and radiation.  I described how Durvalumab works and its role in treatment of her cancer.  We again discussed immune related adverse events including possible pneumonitis, thyroid function, diarrhea, fatigue, serious infections, rash.  Patient agrees to proceed with Durvalumab.  Labs reviewed and acceptable for treatment.  TSH pending and will be monitored throughout treatment.  Proceed with cycle 1 of maintenance Durvalumab today.  She will return to clinic in 4 weeks for labs, further evaluation with Dr. Rennie, and consideration of cycle 2 of Durvalumab.  # Dysphagia-thought to be secondary to radiation.  Now resolved.    # Neck pain- x1 week- likely MSK-now resolved.      # Difficulty sleeping sec to steroids- dose steroid decreased.  She does not complain of this today.   # constipation- G-1-2- metamucil- dulclax-miralax prn.  Can take magnesium citrate for acute constipation then use MiraLAX and senna for maintenance.   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    # Bone health- recommend BMD; but declines- recommend calcium 1200 mg/day;OFF  vit D- high dose vit D- -[June 2025]- HIGH will rechcek vit D levels.    # active smoker: < 1ppd.  Today we had a lengthy conversation about how ongoing tobacco use can increase the risk of progressive disease as well as other health concerns.  She feels highly motivated to quit.  Was not able to afford patches.  We discussed the role of pharmacotherapy including venlafaxine and/or Wellbutrin.  After lengthy discussion of options she elects to proceed with trial of venlafaxine.  We discussed use of the starter pack and potential side effects.  I advised that if she experiences intolerable side effects  to reduce dose to previously tolerated level and notify clinic for evaluation.  We also discussed nicotine cravings and use of nicotine gum.  I recommend 4 mg and with her smoking history typically 1 piece every 2 hours.   # IV ACCESS: s/p port placement.    weekly-  labs/port- MD  chemo- x 3 at  time- 10/6- NO MD PS-   # DISPOSITION: Durvalumab today 4 weeks- port/lab, Dr. Rennie, #2 Durvalumab- la   No problem-specific Assessment & Plan notes found for this encounter.  All questions were answered. The patient/family knows to call the clinic with any problems, questions or concerns.    Tinnie KANDICE Dawn, NP 06/17/2024

## 2024-06-18 LAB — T4: T4, Total: 7.3 ug/dL (ref 4.5–12.0)

## 2024-06-18 LAB — URINE CULTURE: Culture: NO GROWTH

## 2024-06-21 ENCOUNTER — Encounter: Payer: Self-pay | Admitting: Internal Medicine

## 2024-07-15 ENCOUNTER — Inpatient Hospital Stay

## 2024-07-15 ENCOUNTER — Inpatient Hospital Stay: Attending: Internal Medicine

## 2024-07-15 ENCOUNTER — Inpatient Hospital Stay: Attending: Internal Medicine | Admitting: Internal Medicine

## 2024-07-15 ENCOUNTER — Inpatient Hospital Stay: Admitting: Internal Medicine

## 2024-07-15 ENCOUNTER — Encounter: Payer: Self-pay | Admitting: Internal Medicine

## 2024-07-15 VITALS — BP 115/61 | HR 73 | Temp 97.1°F | Resp 18 | Ht 61.0 in | Wt 83.2 lb

## 2024-07-15 DIAGNOSIS — C3431 Malignant neoplasm of lower lobe, right bronchus or lung: Secondary | ICD-10-CM

## 2024-07-15 DIAGNOSIS — Z79811 Long term (current) use of aromatase inhibitors: Secondary | ICD-10-CM | POA: Diagnosis not present

## 2024-07-15 DIAGNOSIS — C50412 Malignant neoplasm of upper-outer quadrant of left female breast: Secondary | ICD-10-CM | POA: Insufficient documentation

## 2024-07-15 DIAGNOSIS — Z170 Estrogen receptor positive status [ER+]: Secondary | ICD-10-CM | POA: Diagnosis not present

## 2024-07-15 DIAGNOSIS — F17210 Nicotine dependence, cigarettes, uncomplicated: Secondary | ICD-10-CM | POA: Diagnosis not present

## 2024-07-15 DIAGNOSIS — M549 Dorsalgia, unspecified: Secondary | ICD-10-CM | POA: Diagnosis not present

## 2024-07-15 DIAGNOSIS — R519 Headache, unspecified: Secondary | ICD-10-CM | POA: Insufficient documentation

## 2024-07-15 DIAGNOSIS — Z9013 Acquired absence of bilateral breasts and nipples: Secondary | ICD-10-CM | POA: Insufficient documentation

## 2024-07-15 DIAGNOSIS — L659 Nonscarring hair loss, unspecified: Secondary | ICD-10-CM | POA: Diagnosis not present

## 2024-07-15 DIAGNOSIS — J439 Emphysema, unspecified: Secondary | ICD-10-CM | POA: Diagnosis not present

## 2024-07-15 DIAGNOSIS — Z5112 Encounter for antineoplastic immunotherapy: Secondary | ICD-10-CM | POA: Diagnosis present

## 2024-07-15 DIAGNOSIS — Z7982 Long term (current) use of aspirin: Secondary | ICD-10-CM | POA: Diagnosis not present

## 2024-07-15 DIAGNOSIS — M2550 Pain in unspecified joint: Secondary | ICD-10-CM | POA: Diagnosis not present

## 2024-07-15 DIAGNOSIS — Z79899 Other long term (current) drug therapy: Secondary | ICD-10-CM | POA: Insufficient documentation

## 2024-07-15 LAB — CBC WITH DIFFERENTIAL (CANCER CENTER ONLY)
Abs Immature Granulocytes: 0.02 K/uL (ref 0.00–0.07)
Basophils Absolute: 0.1 K/uL (ref 0.0–0.1)
Basophils Relative: 1 %
Eosinophils Absolute: 0.1 K/uL (ref 0.0–0.5)
Eosinophils Relative: 2 %
HCT: 36.3 % (ref 36.0–46.0)
Hemoglobin: 12.2 g/dL (ref 12.0–15.0)
Immature Granulocytes: 0 %
Lymphocytes Relative: 14 %
Lymphs Abs: 0.8 K/uL (ref 0.7–4.0)
MCH: 32.6 pg (ref 26.0–34.0)
MCHC: 33.6 g/dL (ref 30.0–36.0)
MCV: 97.1 fL (ref 80.0–100.0)
Monocytes Absolute: 0.7 K/uL (ref 0.1–1.0)
Monocytes Relative: 13 %
Neutro Abs: 3.8 K/uL (ref 1.7–7.7)
Neutrophils Relative %: 70 %
Platelet Count: 325 K/uL (ref 150–400)
RBC: 3.74 MIL/uL — ABNORMAL LOW (ref 3.87–5.11)
RDW: 14.3 % (ref 11.5–15.5)
WBC Count: 5.5 K/uL (ref 4.0–10.5)
nRBC: 0 % (ref 0.0–0.2)

## 2024-07-15 LAB — CMP (CANCER CENTER ONLY)
ALT: 12 U/L (ref 0–44)
AST: 20 U/L (ref 15–41)
Albumin: 3.9 g/dL (ref 3.5–5.0)
Alkaline Phosphatase: 84 U/L (ref 38–126)
Anion gap: 10 (ref 5–15)
BUN: 12 mg/dL (ref 8–23)
CO2: 22 mmol/L (ref 22–32)
Calcium: 9.4 mg/dL (ref 8.9–10.3)
Chloride: 106 mmol/L (ref 98–111)
Creatinine: 0.54 mg/dL (ref 0.44–1.00)
GFR, Estimated: >60 mL/min
Glucose, Bld: 101 mg/dL — ABNORMAL HIGH (ref 70–99)
Potassium: 3.9 mmol/L (ref 3.5–5.1)
Sodium: 139 mmol/L (ref 135–145)
Total Bilirubin: 0.3 mg/dL (ref 0.0–1.2)
Total Protein: 7.3 g/dL (ref 6.5–8.1)

## 2024-07-15 MED ORDER — SODIUM CHLORIDE 0.9% FLUSH
10.0000 mL | INTRAVENOUS | Status: DC | PRN
  Administered 2024-07-15: 10 mL
  Filled 2024-07-15: qty 10

## 2024-07-15 MED ORDER — SODIUM CHLORIDE 0.9 % IV SOLN
INTRAVENOUS | Status: DC
  Filled 2024-07-15: qty 250

## 2024-07-15 MED ORDER — SODIUM CHLORIDE 0.9 % IV SOLN
1500.0000 mg | Freq: Once | INTRAVENOUS | Status: AC
  Administered 2024-07-15: 1500 mg via INTRAVENOUS
  Filled 2024-07-15: qty 30

## 2024-07-15 NOTE — Progress Notes (Signed)
 [facility] CONSULT NOTE  Patient Care Team: Odell Chard, Edra GRADE, MD as PCP - General (Family Medicine) Dellie Louanne MATSU, MD (General Surgery) Cindie Jesusa HERO, RN as Registered Nurse Georgina Shasta POUR, RN as Oncology Nurse Navigator Rennie Cindy SAUNDERS, MD as Consulting Physician (Oncology) Verdene Gills, RN as Oncology Nurse Navigator  CHIEF COMPLAINTS/PURPOSE OF CONSULTATION: Lung cancer/breast cancer  #  Oncology History Overview Note  BRCA negative 2013    Breast cancer [facility]) 2005    left breast ca   Breast cancer [facility]) 2006, 1999    right breast ca   Cancer [facility]) [phone]   # Left breast- 1999 mild ducts- s/p lumpectomy; s/p RT- no chemo [Dr.Sankar/Dr.Byrnett]  # 2006 ? Stage- s/p right mastectomy s/p chemo [Dr.Choksi]; Dr.Chrystal     DIAGNOSIS: A.  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 Size of invasive carcinoma: 2 mm Mm in this sample Histologic grade of invasive carcinoma: Grade 2                      Glandular/tubular differentiation score: 3                      Nuclear pleomorphism score: 3                      Mitotic rate score: 1                      Total score: 7 Ductal carcinoma in situ: Present, high-grade comedo type with associated calcifications Lymphovascular invasion: Not identified  Comment: The definitive grade will be assigned on the excisional specimen. ER/PR/HER2: Immunohistochemistry will be performed on block A2, with reflex to FISH for HER2 2+. The results will be reported in an addendum.  GROSS DESCRIPTION: A. Labeled: Left breast stereo biopsy calcs upper outer quadrant mid depth Received: in a formalin-filled Brevera collection device Specimen radiograph image(s) available for review Time/Date in fixative: Collected at 10:58 AM on 10/23/2022 and placed in formalin at 11:01 AM on 10/23/2022 Cold ischemic time: Less  than 5 minutes Total fixation time: Approximately 9.25 hours Core pieces: Multiple Measurement: Aggregate, 8.5 x 1.5 x 0.3 cm Description / comments: Received are cores and fragments of yellow fibrofatty tissue.  The accompanying diagram has sections B and G checked. Inked: Green Entirely submitted in cassette(s):  1 - section B 2 - section G 3 - 7 - remaining tissue fragments      3 - sections A and C      4 - sections D and E      5 - sections F and H      6 - sections I and J      7 - sections K and L with remaining free-floating fragments  RB 10/23/2022  Final Diagnosis performed by Rexene Daily, MD.   Electronically signed 10/24/2022 10:40:25AM The electronic signature indicates that the named Attending Pathologist has evaluated the specimen Technical component performed at [facility], [address], [place], [place] [address] Lab: [phone] Dir: Frankey Sas, MD, MMM  Professional component performed at [facility], [facility], [address] [place], [place], [place] [address] Lab: [phone] Dir: Wilkie EMERSON Molt, MD  ADDENDUM: CASE SUMMARY: BREAST BIOMARKER TESTS Estrogen Receptor (ER) Status: POSITIVE         Percentage of cells with nuclear positivity: Greater than 90%  Average intensity of staining: Strong  Progesterone Receptor (PgR) Status: NEGATIVE (LESS THAN 1%)         Internal control cells present and stain as expected  HER2 (by immunohistochemistry): NEGATIVE (Score 1+)  Histologic Type: Invasive carcinoma of no special type (ductal) Histologic Grade (Nottingham Histologic Score)      Glandular (Acinar)/Tubular Differentiation: 3      Nuclear Pleomorphism: 3      Mitotic Rate: 3      Overall Grade: Grade 3 Tumor Size: At least 17 mm Tumor Focality: Single focus of invasive carcinoma Ductal Carcinoma In Situ (DCIS): Present      Positive for extensive intraductal component (EIC) Tumor Extent: Not applicable Lymphatic and/or  Vascular Invasion: Not identified  Treatment Effect in the Breast: No known presurgical therapy  MARGINS Margin Status for Invasive Carcinoma: All margins negative for invasive carcinoma   APRIL/MAY 2024-left breast invasive mammary carcinoma s/p mastectomy [Dr. Rodenberg]-at least pT1c [17 mm]; 2 sentinel lymph nodes negative; grade 3; Oncotype 35 high risk-declined chemotherapy.   # # AUG 2025-[ LCSP] Dr. Tamea. POSITIVE FOR MALIGNANCY- SQUAMOUS CELL CARCINOMA- CT RIGHT lower lobe, 6.4 cm greatest   PET scan:  Hypermetabolic right lower lobe mass and probable right hilar adenopathy, findings consistent with T2 be N1 M0 or stage II B primary bronchogenic carcinoma.  AUG brain MRI-NEG.   # Treatment: Definitive weekly carbotaxol with the daily radiation. [Start- 03/15/24]       Carcinoma of upper-outer quadrant of left breast in female, estrogen receptor positive (HCC)  10/30/2022 Initial Diagnosis   Carcinoma of upper-outer quadrant of left breast in female, estrogen receptor positive   10/30/2022 Cancer Staging   Staging form: Breast, AJCC 8th Edition - Clinical: Stage IA (cT1a, cN0, cM0, G2, ER+, PR-, HER2-) - Signed by Rennie Cindy SAUNDERS, MD on 10/30/2022 Histologic grading system: 3 grade system   12/05/2022 - 12/05/2022 Chemotherapy   Patient is on Treatment Plan : BREAST TC q21d     Primary cancer of right lower lobe of lung (HCC)  02/23/2024 Initial Diagnosis   Primary cancer of right lower lobe of lung (HCC)   02/23/2024 Cancer Staging   Staging form: Lung, AJCC V9 - Clinical: Stage IIB (cT2b, cN1, cM0) - Signed by Rennie Cindy SAUNDERS, MD on 02/23/2024   03/15/2024 - 04/26/2024 Chemotherapy   Patient is on Treatment Plan : LUNG Carboplatin  + Paclitaxel  + XRT q7d     06/17/2024 -  Chemotherapy   Patient is on Treatment Plan : LUNG NSCLC Durvalumab  (1500) q28d      HISTORY OF PRESENTING ILLNESS: Patient ambulating-independently.  Alone.   Gabriella Green 74 y.o.   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  Discussed the use of AI scribe software for clinical note transcription with the patient, who gave verbal consent to proceed.  History of Present Illness   Gabriella Green is a 74 year old female with non-small cell lung cancer of the right lower lobe, status post chemotherapy and radiation, currently receiving immunotherapy, who presents for oncology follow-up to review recent imaging and ongoing management.  She is undergoing active treatment for right lower lobe non-small cell lung cancer, having completed chemotherapy and radiation, and is currently receiving monthly immunotherapy. CT imaging in November demonstrated decreased tumor size with a persistent residual mass. She inquired about the significance of the remaining lesion, discussing  the possibility of residual malignancy versus post-treatment fibrosis. Repeat imaging is scheduled for April to monitor for interval changes.  Over the past three to four weeks, she has noted improved energy and well-being, with a three-pound weight gain and increased appetite. Energy levels fluctuate but are overall improved compared to prior visits. She denies constipation, diarrhea, nausea, vomiting, or skin rash. She experiences intermittent headaches, though not daily, and has had significant alopecia with some hair remaining.  She is aware of findings of emphysema, osteoarthritis, and vascular calcifications on prior imaging. Current medications include anastrozole , aspirin, and omeprazole .       Review of Systems  Constitutional:  Negative for chills, diaphoresis, fever, malaise/fatigue and weight loss.  HENT:  Negative for nosebleeds and sore throat.   Eyes:  Negative for double vision.  Respiratory:  Negative for hemoptysis, sputum production, shortness of breath and wheezing.    Cardiovascular:  Negative for chest pain, palpitations, orthopnea and leg swelling.  Gastrointestinal:  Negative for abdominal pain, blood in stool, constipation, diarrhea, heartburn, melena, nausea and vomiting.  Genitourinary:  Negative for dysuria, frequency and urgency.  Musculoskeletal:  Positive for back pain and joint pain.  Skin: Negative.  Negative for itching and rash.  Neurological:  Negative for dizziness, tingling, focal weakness, weakness and headaches.  Endo/Heme/Allergies:  Does not bruise/bleed easily.  Psychiatric/Behavioral:  Negative for depression. The patient is not nervous/anxious and does not have insomnia.      MEDICAL HISTORY:  Past Medical History:  Diagnosis Date   BRCA negative 2013   Breast cancer (HCC) 2005   left breast ca   Breast cancer (HCC) 2006, 1999   right breast ca   COPD (chronic obstructive pulmonary disease) (HCC)    Mass of right lung    Multiple lung nodules    Personal history of chemotherapy    2006 rt breast ca   Personal history of radiation therapy    PONV (postoperative nausea and vomiting)    RBBB (right bundle branch block)     SURGICAL HISTORY: Past Surgical History:  Procedure Laterality Date   BREAST BIOPSY Left 2019    ADENOSIS, SKELETAL MUSCLE of LEFT breast   BREAST BIOPSY Left 10/23/2022   Left Breast Stereo Bx, X clip - path pending   BREAST BIOPSY Left 10/23/2022   MM LT BREAST BX W LOC DEV 1ST LESION IMAGE BX SPEC STEREO GUIDE 10/23/2022 [facility]   BREAST CYST ASPIRATION Bilateral    BREAST EXCISIONAL BIOPSY Right 1999   rad breast ca   BREAST EXCISIONAL BIOPSY Left 2005   rad breast ca   BREAST EXCISIONAL BIOPSY Right 2006   mastectomy chemo    BREAST LUMPECTOMY Left 2005   1999 right lumpectomy   CHOLECYSTECTOMY  2002   COLONOSCOPY  2009   COLONOSCOPY WITH PROPOFOL  N/A 09/03/2018   Procedure: COLONOSCOPY WITH PROPOFOL ;  Surgeon: Unk Corinn Skiff, MD;  Location: [facility];  Service:  Gastroenterology;  Laterality: N/A;   ENDOBRONCHIAL ULTRASOUND Bilateral 02/16/2024   Procedure: ENDOBRONCHIAL ULTRASOUND (EBUS);  Surgeon: Tamea Dedra CROME, MD;  Location: [facility] OR[facility];  Service: Pulmonary;  Laterality: Bilateral;   ESOPHAGOGASTRODUODENOSCOPY (EGD) WITH PROPOFOL  N/A 09/03/2018   Procedure: ESOPHAGOGASTRODUODENOSCOPY (EGD) WITH PROPOFOL ;  Surgeon: Unk Corinn Skiff, MD;  Location: [facility];  Service: Gastroenterology;  Laterality: N/A;   IR IMAGING GUIDED PORT INSERTION  03/04/2024   MASTECTOMY Right 2006   MASTECTOMY W/ SENTINEL NODE BIOPSY Left 11/13/2022   Procedure: MASTECTOMY WITH SENTINEL LYMPH NODE BIOPSY, total  mastectomy;  Surgeon: Lane Shope, MD;  Location: [facility] OR[facility];  Service: General;  Laterality: Left;   VIDEO BRONCHOSCOPY WITH ENDOBRONCHIAL NAVIGATION Bilateral 02/16/2024   Procedure: VIDEO BRONCHOSCOPY WITH ENDOBRONCHIAL NAVIGATION;  Surgeon: Tamea Dedra CROME, MD;  Location: [facility] ORS;  Service: Pulmonary;  Laterality: Bilateral;    SOCIAL HISTORY: Social History   Socioeconomic History   Marital status: Widowed    Spouse name: Not on file   Number of children: Not on file   Years of education: Not on file   Highest education level: Not on file  Occupational History   Not on file  Tobacco Use   Smoking status: Every Day    Current packs/day: 0.50    Average packs/day: 1 pack/day for 46.0 years (45.8 ttl pk-yrs)    Types: Cigarettes    Start date: 1980    Passive exposure: Past   Smokeless tobacco: Never  Vaping Use   Vaping status: Never Used  Substance and Sexual Activity   Alcohol use: No   Drug use: No   Sexual activity: Not on file  Other Topics Concern   Not on file  Social History Narrative   Not on file   Social Drivers of Health   Tobacco Use: High Risk (07/15/2024)   Patient History    Smoking Tobacco Use: Every Day    Smokeless Tobacco Use: Never    Passive Exposure: Past  Financial Resource Strain: Not on file  Food  Insecurity: No Food Insecurity (04/14/2024)   Epic    Worried About Programme Researcher, Broadcasting/film/video in the Last Year: Never true    Ran Out of Food in the Last Year: Never true  Transportation Needs: No Transportation Needs (04/14/2024)   Epic    Lack of Transportation (Medical): No    Lack of Transportation (Non-Medical): No  Physical Activity: Not on file  Stress: Not on file  Social Connections: Not on file  Intimate Partner Violence: Not on file  Depression (PHQ2-9): Low Risk (07/15/2024)   Depression (PHQ2-9)    PHQ-2 Score: 0  Alcohol Screen: Low Risk (04/14/2024)   Alcohol Screen    Last Alcohol Screening Score (AUDIT): 0  Housing: Low Risk (04/14/2024)   Epic    Unable to Pay for Housing in the Last Year: No    Number of Times Moved in the Last Year: 0    Homeless in the Last Year: No  Utilities: Not At Risk (04/14/2024)   Epic    Threatened with loss of utilities: No  Health Literacy: Not on file    FAMILY HISTORY: Family History  Problem Relation Age of Onset   Breast cancer Mother 78   Breast cancer Sister 80   Breast cancer Maternal Aunt 40    ALLERGIES:  is allergic to codeine and tape.  MEDICATIONS:  Current Outpatient Medications  Medication Sig Dispense Refill   acetaminophen  (TYLENOL ) 500 MG tablet Take 500 mg by mouth every 6 (six) hours as needed for mild pain (pain score 1-3) or moderate pain (pain score 4-6).     anastrozole  (ARIMIDEX ) 1 MG tablet TAKE 1 TABLET(1 MG) BY MOUTH DAILY 90 tablet 1   aspirin EC 81 MG tablet Take 81 mg by mouth at bedtime. Swallow whole.     omeprazole  (PRILOSEC) 40 MG capsule Take 1 capsule (40 mg total) by mouth daily. 30 capsule 2   ondansetron  (ZOFRAN -ODT) 4 MG disintegrating tablet Take 4 mg by mouth every 8 (eight) hours as needed. (Patient not  taking: Reported on 04/26/2024)     traMADol  (ULTRAM ) 50 MG tablet Take 1 tablet (50 mg total) by mouth every 6 (six) hours as needed. (Patient not taking: Reported on 07/15/2024) 30 tablet 0    triamcinolone  ointment (KENALOG ) 0.5 % Apply 1 Application topically 2 (two) times daily. (Patient not taking: Reported on 07/15/2024) 30 g 0   varenicline  (CHANTIX ) 0.5 MG tablet Days 1 to 3- 0.5 mg once daily,Days 4 to 7-0.5 mg twice daily,Maintenance day 8 and later 1 mg twice daily- may consider a temporary or permanent dose reduction if usual dose is not tolerated 60 tablet 0   No current facility-administered medications for this visit.     PHYSICAL EXAMINATION:   Vitals:   07/15/24 0901  BP: 115/61  Pulse: 73  Resp: 18  Temp: (!) 97.1 F (36.2 C)  SpO2: 100%       Filed Weights   07/15/24 0901  Weight: 83 lb 3.2 oz (37.7 kg)     Physical Exam Vitals and nursing note reviewed.  HENT:     Head: Normocephalic and atraumatic.     Mouth/Throat:     Pharynx: Oropharynx is clear.  Eyes:     Extraocular Movements: Extraocular movements intact.     Pupils: Pupils are equal, round, and reactive to light.  Cardiovascular:     Rate and Rhythm: Normal rate and regular rhythm.  Pulmonary:     Comments: Decreased breath sounds bilaterally.  Abdominal:     Palpations: Abdomen is soft.  Musculoskeletal:        General: Normal range of motion.     Cervical back: Normal range of motion.  Skin:    General: Skin is warm.  Neurological:     General: No focal deficit present.     Mental Status: She is alert and oriented to person, place, and time.  Psychiatric:        Behavior: Behavior normal.        Judgment: Judgment normal.      LABORATORY DATA:  I have reviewed the data as listed Lab Results  Component Value Date   WBC 5.5 07/15/2024   HGB 12.2 07/15/2024   HCT 36.3 07/15/2024   MCV 97.1 07/15/2024   PLT 325 07/15/2024   Recent Labs    04/19/24 0833 04/26/24 0807 06/17/24 1009  NA 134* 136 139  K 4.0 4.0 4.1  CL 102 104 105  CO2 24 23 23   GLUCOSE 101* 101* 111*  BUN 18 12 12   CREATININE 0.53 0.55 0.59  CALCIUM 9.1 9.1 9.6  GFRNONAA >60 >60 >60   PROT 7.5 7.4 7.3  ALBUMIN 3.3* 3.3* 4.1  AST 17 16 21   ALT 13 11 9   ALKPHOS 68 70 77  BILITOT 0.6 0.6 0.3     RADIOGRAPHIC STUDIES: I have personally reviewed the radiological images as listed and agreed with the findings in the report. No results found.   ASSESSMENT & PLAN:   Primary cancer of right lower lobe of lung (HCC) # AUG 2025-[ [facility]SP] Dr. Tamea. POSITIVE FOR MALIGNANCY- SQUAMOUS CELL CARCINOMA- CT RIGHT lower lobe, 6.4 cm greatest   PET scan:  Hypermetabolic right lower lobe mass and probable right hilar adenopathy, findings consistent with T2 be N1 M0 or stage II B primary bronchogenic carcinoma.  AUG 2025-  brain MRI-NEG. Treatment: s/p Definitive weekly carbotaxol with the daily radiation. [Start- 03/15/24- 04/29/2024]  # NOV 22nd, 2025- CT chest Reduce size and increased cavitation of the  right lower lobe mass, currently measuring 3.7 x 2.5 cm and previously 4.8 x 3.3 cm on 02/13/24. No evidence of metastatic disease; Stable 3 mm nodules in the left upper lobe without change, recommend continued surveillance; await repeat CT scan in April 2026 [rad onc]  # APRIL 2024-left breast stage I high risk- INVASIVE MAMMARY CARCINOMA, s/p mastectomy [Dr.Rodenberg];2006-  s/p chemo;  bilateral mastectomy - on adjuvant anastrozole  x10  years-.-Stable.   # Bone health- recommend BMD; but declines- recommend calcium 1200 mg/day;OFF vit D- high dose vit D- -[June 2025]- HIGH will rechcek vit D levels.   # active smoker: < 1ppd; recommend quitting-   # IV ACCESS: s/p port placement.   #Incidental findings on Imaging  CT , 2025: 4. Mild thoracic spondylosis;  Atherosclerosis and emphysema.I reviewed/discussed/counseled the patient.   PS-  # DISPOSITION: # IMFINZI  today-  # Follow up in  4 weeks with MD; labs/port- IMFINZI  -Dr.B   All questions were answered. The patient/family knows to call the clinic with any problems, questions or concerns.    Cindy JONELLE Joe,  MD 07/15/2024 9:22 AM

## 2024-07-15 NOTE — Progress Notes (Signed)
 Patient doing good; no new or acute concerns at this time.

## 2024-07-15 NOTE — Assessment & Plan Note (Addendum)
#   AUG 2025-[ LCSP] Dr. Tamea. POSITIVE FOR MALIGNANCY- SQUAMOUS CELL CARCINOMA- CT RIGHT lower lobe, 6.4 cm greatest   PET scan:  Hypermetabolic right lower lobe mass and probable right hilar adenopathy, findings consistent with T2 be N1 M0 or stage II B primary bronchogenic carcinoma.  AUG 2025-  brain MRI-NEG. Treatment: s/p Definitive weekly carbotaxol with the daily radiation. [Start- 03/15/24- 04/29/2024]  # NOV 22nd, 2025- CT chest Reduce size and increased cavitation of the right lower lobe mass, currently measuring 3.7 x 2.5 cm and previously 4.8 x 3.3 cm on 02/13/24. No evidence of metastatic disease; Stable 3 mm nodules in the left upper lobe without change, recommend continued surveillance; await repeat CT scan in April 2026 [rad onc]  # APRIL 2024-left breast stage I high risk- INVASIVE MAMMARY CARCINOMA, s/p mastectomy [Dr.Rodenberg];2006-  s/p chemo;  bilateral mastectomy - on adjuvant anastrozole  x10  years-.-Stable.   # Bone health- recommend BMD; but declines- recommend calcium 1200 mg/day;OFF vit D- high dose vit D- -[June 2025]- HIGH will rechcek vit D levels.   # active smoker: < 1ppd; recommend quitting-   # IV ACCESS: s/p port placement.   #Incidental findings on Imaging  CT , 2025: 4. Mild thoracic spondylosis;  Atherosclerosis and emphysema.I reviewed/discussed/counseled the patient.   PS-  # DISPOSITION: # IMFINZI  today-  # Follow up in  4 weeks with MD; labs/port- IMFINZI  -Dr.B  # I reviewed the blood work- with the patient in detail; also reviewed the imaging independently [as summarized above]; and with the patient in detail.

## 2024-07-15 NOTE — Patient Instructions (Signed)
 [facility] - A DEPT OF [facility]  Discharge Instructions: Thank you for choosing [facility] to provide your oncology and hematology care.  If you have a lab appointment with the Cancer Center, please go directly to the Cancer Center and check in at the registration area.  Wear comfortable clothing and clothing appropriate for easy access to any Portacath or PICC line.   We strive to give you quality time with your provider. You may need to reschedule your appointment if you arrive late (15 or more minutes).  Arriving late affects you and other patients whose appointments are after yours.  Also, if you miss three or more appointments without notifying the office, you may be dismissed from the clinic at the provider's discretion.      For prescription refill requests, have your pharmacy contact our office and allow 72 hours for refills to be completed.    Today you received the following chemotherapy and/or immunotherapy agents durvalumab      To help prevent nausea and vomiting after your treatment, we encourage you to take your nausea medication as directed.  BELOW ARE SYMPTOMS THAT SHOULD BE REPORTED IMMEDIATELY: *FEVER GREATER THAN 100.4 F (38 C) OR HIGHER *CHILLS OR SWEATING *NAUSEA AND VOMITING THAT IS NOT CONTROLLED WITH YOUR NAUSEA MEDICATION *UNUSUAL SHORTNESS OF BREATH *UNUSUAL BRUISING OR BLEEDING *URINARY PROBLEMS (pain or burning when urinating, or frequent urination) *BOWEL PROBLEMS (unusual diarrhea, constipation, pain near the anus) TENDERNESS IN MOUTH AND THROAT WITH OR WITHOUT PRESENCE OF ULCERS (sore throat, sores in mouth, or a toothache) UNUSUAL RASH, SWELLING OR PAIN  UNUSUAL VAGINAL DISCHARGE OR ITCHING   Items with * indicate a potential emergency and should be followed up as soon as possible or go to the Emergency Department if any problems should occur.  Please show the CHEMOTHERAPY ALERT CARD or IMMUNOTHERAPY  ALERT CARD at check-in to the Emergency Department and triage nurse.  Should you have questions after your visit or need to cancel or reschedule your appointment, please contact [facility] - A DEPT OF [facility]  [phone] and follow the prompts.  Office hours are 8:00 a.m. to 4:30 p.m. Monday - Friday. Please note that voicemails left after 4:00 p.m. may not be returned until the following business day.  We are closed weekends and major holidays. You have access to a nurse at all times for urgent questions. Please call the main number to the clinic [phone] and follow the prompts.  For any non-urgent questions, you may also contact your provider using MyChart. We now offer e-Visits for anyone 82 and older to request care online for non-urgent symptoms. For details visit [email or web address].   Also download the MyChart app! Go to the app store, search "MyChart", open the app, select [facility], and log in with your MyChart username and password.

## 2024-07-15 NOTE — Patient Instructions (Signed)

## 2024-08-11 ENCOUNTER — Encounter: Payer: Self-pay | Admitting: Internal Medicine

## 2024-08-11 ENCOUNTER — Inpatient Hospital Stay

## 2024-08-11 ENCOUNTER — Inpatient Hospital Stay: Attending: Internal Medicine | Admitting: Internal Medicine

## 2024-08-11 VITALS — BP 102/53 | HR 91

## 2024-08-11 VITALS — BP 135/72 | HR 94 | Temp 97.8°F | Resp 16 | Ht 61.0 in | Wt 81.9 lb

## 2024-08-11 DIAGNOSIS — C3431 Malignant neoplasm of lower lobe, right bronchus or lung: Secondary | ICD-10-CM

## 2024-08-11 LAB — CMP (CANCER CENTER ONLY)
ALT: 13 U/L (ref 0–44)
AST: 25 U/L (ref 15–41)
Albumin: 4.1 g/dL (ref 3.5–5.0)
Alkaline Phosphatase: 86 U/L (ref 38–126)
Anion gap: 12 (ref 5–15)
BUN: 12 mg/dL (ref 8–23)
CO2: 23 mmol/L (ref 22–32)
Calcium: 9.5 mg/dL (ref 8.9–10.3)
Chloride: 105 mmol/L (ref 98–111)
Creatinine: 0.56 mg/dL (ref 0.44–1.00)
GFR, Estimated: >60 mL/min
Glucose, Bld: 102 mg/dL — ABNORMAL HIGH (ref 70–99)
Potassium: 3.8 mmol/L (ref 3.5–5.1)
Sodium: 139 mmol/L (ref 135–145)
Total Bilirubin: 0.3 mg/dL (ref 0.0–1.2)
Total Protein: 7.4 g/dL (ref 6.5–8.1)

## 2024-08-11 LAB — CBC WITH DIFFERENTIAL (CANCER CENTER ONLY)
Abs Immature Granulocytes: 0.01 10*3/uL (ref 0.00–0.07)
Basophils Absolute: 0 10*3/uL (ref 0.0–0.1)
Basophils Relative: 1 %
Eosinophils Absolute: 0.1 10*3/uL (ref 0.0–0.5)
Eosinophils Relative: 2 %
HCT: 36.4 % (ref 36.0–46.0)
Hemoglobin: 12.4 g/dL (ref 12.0–15.0)
Immature Granulocytes: 0 %
Lymphocytes Relative: 14 %
Lymphs Abs: 0.7 10*3/uL (ref 0.7–4.0)
MCH: 32.7 pg (ref 26.0–34.0)
MCHC: 34.1 g/dL (ref 30.0–36.0)
MCV: 96 fL (ref 80.0–100.0)
Monocytes Absolute: 0.7 10*3/uL (ref 0.1–1.0)
Monocytes Relative: 15 %
Neutro Abs: 3.2 10*3/uL (ref 1.7–7.7)
Neutrophils Relative %: 68 %
Platelet Count: 321 10*3/uL (ref 150–400)
RBC: 3.79 MIL/uL — ABNORMAL LOW (ref 3.87–5.11)
RDW: 13.2 % (ref 11.5–15.5)
WBC Count: 4.7 10*3/uL (ref 4.0–10.5)
nRBC: 0 % (ref 0.0–0.2)

## 2024-08-11 LAB — TSH: TSH: 0.805 u[IU]/mL (ref 0.350–4.500)

## 2024-08-11 MED ORDER — SODIUM CHLORIDE 0.9 % IV SOLN
1500.0000 mg | Freq: Once | INTRAVENOUS | Status: AC
  Administered 2024-08-11: 1500 mg via INTRAVENOUS
  Filled 2024-08-11: qty 30

## 2024-08-11 MED ORDER — SODIUM CHLORIDE 0.9 % IV SOLN
INTRAVENOUS | Status: DC
  Filled 2024-08-11: qty 250

## 2024-08-11 NOTE — Progress Notes (Signed)
 [facility] CONSULT NOTE  Patient Care Team: Odell Chard, Edra GRADE, MD as PCP - General (Family Medicine) Dellie Louanne MATSU, MD (General Surgery) Cindie Jesusa HERO, RN as Registered Nurse Georgina Shasta POUR, RN as Oncology Nurse Navigator Rennie Cindy SAUNDERS, MD as Consulting Physician (Oncology) Verdene Gills, RN as Oncology Nurse Navigator  CHIEF COMPLAINTS/PURPOSE OF CONSULTATION: Lung cancer/breast cancer  #  Oncology History Overview Note  BRCA negative 2013    Breast cancer [facility]) 2005    left breast ca   Breast cancer [facility]) 2006, 1999    right breast ca   Cancer [facility]) [phone]   # Left breast- 1999 mild ducts- s/p lumpectomy; s/p RT- no chemo [Dr.Sankar/Dr.Byrnett]  # 2006 ? Stage- s/p right mastectomy s/p chemo [Dr.Choksi]; Dr.Chrystal     DIAGNOSIS: A.  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 Size of invasive carcinoma: 2 mm Mm in this sample Histologic grade of invasive carcinoma: Grade 2                      Glandular/tubular differentiation score: 3                      Nuclear pleomorphism score: 3                      Mitotic rate score: 1                      Total score: 7 Ductal carcinoma in situ: Present, high-grade comedo type with associated calcifications Lymphovascular invasion: Not identified  Comment: The definitive grade will be assigned on the excisional specimen. ER/PR/HER2: Immunohistochemistry will be performed on block A2, with reflex to FISH for HER2 2+. The results will be reported in an addendum.  GROSS DESCRIPTION: A. Labeled: Left breast stereo biopsy calcs upper outer quadrant mid depth Received: in a formalin-filled Bre[employer]a collection device Specimen radiograph image(s) available for review Time/Date in fixative: Collected at 10:58 AM on 10/23/2022 and placed in formalin at 11:01 AM on 10/23/2022 Cold ischemic time: Less  than 5 minutes Total fixation time: Approximately 9.25 hours Core pieces: Multiple Measurement: Aggregate, 8.5 x 1.5 x 0.3 cm Description / comments: Received are cores and fragments of yellow fibrofatty tissue.  The accompanying diagram has sections B and G checked. Inked: Green Entirely submitted in cassette(s):  1 - section B 2 - section G 3 - 7 - remaining tissue fragments      3 - sections A and C      4 - sections D and E      5 - sections F and H      6 - sections I and J      7 - sections K and L with remaining free-floating fragments  RB 10/23/2022  Final Diagnosis performed by Rexene Daily, MD.   Electronically signed 10/24/2022 10:40:25AM The electronic signature indicates that the named Attending Pathologist has evaluated the specimen Technical component performed at [facility], [address] [place], [place] [address] Lab: [phone] Dir: Frankey Sas, MD, MMM  Professional component performed at [facility], [facility], [address] [place], [place], [place] [address] Lab: [phone] Dir: Wilkie EMERSON Molt, MD  ADDENDUM: CASE SUMMARY: BREAST BIOMARKER TESTS Estrogen Receptor (ER) Status: POSITIVE         Percentage of cells with nuclear positivity: Greater than 90%  Average intensity of staining: Strong  Progesterone Receptor (PgR) Status: NEGATIVE (LESS THAN 1%)         Internal control cells present and stain as expected  HER2 (by immunohistochemistry): NEGATIVE (Score 1+)  Histologic Type: Invasive carcinoma of no special type (ductal) Histologic Grade (Nottingham Histologic Score)      Glandular (Acinar)/Tubular Differentiation: 3      Nuclear Pleomorphism: 3      Mitotic Rate: 3      Overall Grade: Grade 3 Tumor Size: At least 17 mm Tumor Focality: Single focus of invasive carcinoma Ductal Carcinoma In Situ (DCIS): Present      Positive for extensive intraductal component (EIC) Tumor Extent: Not applicable Lymphatic and/or  Vascular Invasion: Not identified  Treatment Effect in the Breast: No known presurgical therapy  MARGINS Margin Status for Invasive Carcinoma: All margins negative for invasive carcinoma   APRIL/MAY 2024-left breast invasive mammary carcinoma s/p mastectomy [Dr. Rodenberg]-at least pT1c [17 mm]; 2 sentinel lymph nodes negative; grade 3; Oncotype 35 high risk-declined chemotherapy.   # # AUG 2025-[ LCSP] Dr. Tamea. POSITIVE FOR MALIGNANCY- SQUAMOUS CELL CARCINOMA- CT RIGHT lower lobe, 6.4 cm greatest   PET scan:  Hypermetabolic right lower lobe mass and probable right hilar adenopathy, findings consistent with T2 be N1 M0 or stage II B primary bronchogenic carcinoma.  AUG brain MRI-NEG.   # Treatment: Definitive weekly carbotaxol with the daily radiation. [Start- 03/15/24]       Carcinoma of upper-outer quadrant of left breast in female, estrogen receptor positive (HCC)  10/30/2022 Initial Diagnosis   Carcinoma of upper-outer quadrant of left breast in female, estrogen receptor positive   10/30/2022 Cancer Staging   Staging form: Breast, AJCC 8th Edition - Clinical: Stage IA (cT1a, cN0, cM0, G2, ER+, PR-, HER2-) - Signed by Rennie Cindy SAUNDERS, MD on 10/30/2022 Histologic grading system: 3 grade system   12/05/2022 - 12/05/2022 Chemotherapy   Patient is on Treatment Plan : BREAST TC q21d     Primary cancer of right lower lobe of lung (HCC)  02/23/2024 Initial Diagnosis   Primary cancer of right lower lobe of lung (HCC)   02/23/2024 Cancer Staging   Staging form: Lung, AJCC V9 - Clinical: Stage IIB (cT2b, cN1, cM0) - Signed by Rennie Cindy SAUNDERS, MD on 02/23/2024   03/15/2024 - 04/26/2024 Chemotherapy   Patient is on Treatment Plan : LUNG Carboplatin  + Paclitaxel  + XRT q7d     06/17/2024 -  Chemotherapy   Patient is on Treatment Plan : LUNG NSCLC Durvalumab  (1500) q28d      HISTORY OF PRESENTING ILLNESS: Patient ambulating-independently.  Alone.   Gabriella Green 74 y.o.   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  Discussed the use of AI scribe software for clinical note transcription with the patient, who gave verbal consent to proceed.  History of Present Illness   Gabriella Green is a 74 year old female with stage IIB non-small cell lung cancer receiving maintenance durvalumab  who presents for routine oncology follow-up.  She is currently receiving maintenance durvalumab  for stage IIB non-small cell lung cancer. She denies new or worsening cancer-related symptoms, including cough, dyspnea, hemoptysis, chest pain related to malignancy, fever, night sweats, or unintentional weight loss. Recent blood work and blood pressure readings are within normal limits.  She is experiencing significant emotional distress following the sudden death of her grandson,  whom she raised and lived with for approximately twenty years. She has had insomnia for two nights and describes profound grief. She reports chest pain described as 'hurting in my chest like I was having a heart attack,' which she associates with emotional heartache and has experienced previously during bereavement. She expresses concern about her blood pressure in the context of recent insomnia and emotional distress, but notes her blood pressure is usually within normal limits and recent laboratory results were normal.      Review of Systems  Constitutional:  Negative for chills, diaphoresis, fever, malaise/fatigue and weight loss.  HENT:  Negative for nosebleeds and sore throat.   Eyes:  Negative for double vision.  Respiratory:  Negative for hemoptysis, sputum production, shortness of breath and wheezing.   Cardiovascular:  Negative for chest pain, palpitations, orthopnea and leg swelling.  Gastrointestinal:  Negative for abdominal pain, blood in stool, constipation, diarrhea, heartburn,  melena, nausea and vomiting.  Genitourinary:  Negative for dysuria, frequency and urgency.  Musculoskeletal:  Positive for back pain and joint pain.  Skin: Negative.  Negative for itching and rash.  Neurological:  Negative for dizziness, tingling, focal weakness, weakness and headaches.  Endo/Heme/Allergies:  Does not bruise/bleed easily.  Psychiatric/Behavioral:  Negative for depression. The patient is not nervous/anxious and does not have insomnia.      MEDICAL HISTORY:  Past Medical History:  Diagnosis Date   BRCA negative 2013   Breast cancer (HCC) 2005   left breast ca   Breast cancer (HCC) 2006, 1999   right breast ca   COPD (chronic obstructive pulmonary disease) (HCC)    Mass of right lung    Multiple lung nodules    Personal history of chemotherapy    2006 rt breast ca   Personal history of radiation therapy    PONV (postoperative nausea and vomiting)    RBBB (right bundle branch block)     SURGICAL HISTORY: Past Surgical History:  Procedure Laterality Date   BREAST BIOPSY Left 2019    ADENOSIS, SKELETAL MUSCLE of LEFT breast   BREAST BIOPSY Left 10/23/2022   Left Breast Stereo Bx, X clip - path pending   BREAST BIOPSY Left 10/23/2022   MM LT BREAST BX W LOC DEV 1ST LESION IMAGE BX SPEC STEREO GUIDE 10/23/2022 [facility]   BREAST CYST ASPIRATION Bilateral    BREAST EXCISIONAL BIOPSY Right 1999   rad breast ca   BREAST EXCISIONAL BIOPSY Left 2005   rad breast ca   BREAST EXCISIONAL BIOPSY Right 2006   mastectomy chemo    BREAST LUMPECTOMY Left 2005   1999 right lumpectomy   CHOLECYSTECTOMY  2002   COLONOSCOPY  2009   COLONOSCOPY WITH PROPOFOL  N/A 09/03/2018   Procedure: COLONOSCOPY WITH PROPOFOL ;  Surgeon: Unk Corinn Skiff, MD;  Location: [facility];  Service: Gastroenterology;  Laterality: N/A;   ENDOBRONCHIAL ULTRASOUND Bilateral 02/16/2024   Procedure: ENDOBRONCHIAL ULTRASOUND (EBUS);  Surgeon: Tamea Dedra CROME, MD;  Location: [facility];   Service: Pulmonary;  Laterality: Bilateral;   ESOPHAGOGASTRODUODENOSCOPY (EGD) WITH PROPOFOL  N/A 09/03/2018   Procedure: ESOPHAGOGASTRODUODENOSCOPY (EGD) WITH PROPOFOL ;  Surgeon: Unk Corinn Skiff, MD;  Location: [facility];  Service: Gastroenterology;  Laterality: N/A;   IR IMAGING GUIDED PORT INSERTION  03/04/2024   MASTECTOMY Right 2006   MASTECTOMY W/ SENTINEL NODE BIOPSY Left 11/13/2022   Procedure: MASTECTOMY WITH SENTINEL LYMPH NODE BIOPSY, total mastectomy;  Surgeon: Lane Shope, MD;  Location: [facility];  Service: General;  Laterality: Left;   VIDEO  BRONCHOSCOPY WITH ENDOBRONCHIAL NAVIGATION Bilateral 02/16/2024   Procedure: VIDEO BRONCHOSCOPY WITH ENDOBRONCHIAL NAVIGATION;  Surgeon: Tamea Dedra CROME, MD;  Location: [facility];  Service: Pulmonary;  Laterality: Bilateral;    SOCIAL HISTORY: Social History   Socioeconomic History   Marital status: Widowed    Spouse name: Not on file   Number of children: Not on file   Years of education: Not on file   Highest education level: Not on file  Occupational History   Not on file  Tobacco Use   Smoking status: Every Day    Current packs/day: 0.50    Average packs/day: 1 pack/day for 46.1 years (45.8 ttl pk-yrs)    Types: Cigarettes    Start date: 1980    Passive exposure: Past   Smokeless tobacco: Never  Vaping Use   Vaping status: Never Used  Substance and Sexual Activity   Alcohol use: No   Drug use: No   Sexual activity: Not on file  Other Topics Concern   Not on file  Social History Narrative   Not on file   Social Drivers of Health   Tobacco Use: High Risk (08/11/2024)   Patient History    Smoking Tobacco Use: Every Day    Smokeless Tobacco Use: Never    Passive Exposure: Past  Financial Resource Strain: Not on file  Food Insecurity: No Food Insecurity (04/14/2024)   Epic    Worried About Programme Researcher, Broadcasting/film/video in the Last Year: Never true    Ran Out of Food in the Last Year: Never true  Transportation  Needs: No Transportation Needs (04/14/2024)   Epic    Lack of Transportation (Medical): No    Lack of Transportation (Non-Medical): No  Physical Activity: Not on file  Stress: Not on file  Social Connections: Not on file  Intimate Partner Violence: Not on file  Depression (PHQ2-9): Low Risk (08/11/2024)   Depression (PHQ2-9)    PHQ-2 Score: 0  Alcohol Screen: Low Risk (04/14/2024)   Alcohol Screen    Last Alcohol Screening Score (AUDIT): 0  Housing: Low Risk (04/14/2024)   Epic    Unable to Pay for Housing in the Last Year: No    Number of Times Moved in the Last Year: 0    Homeless in the Last Year: No  Utilities: Not At Risk (04/14/2024)   Epic    Threatened with loss of utilities: No  Health Literacy: Not on file    FAMILY HISTORY: Family History  Problem Relation Age of Onset   Breast cancer Mother 62   Breast cancer Sister 54   Breast cancer Maternal Aunt 40    ALLERGIES:  is allergic to codeine and tape.  MEDICATIONS:  Current Outpatient Medications  Medication Sig Dispense Refill   acetaminophen  (TYLENOL ) 500 MG tablet Take 500 mg by mouth every 6 (six) hours as needed for mild pain (pain score 1-3) or moderate pain (pain score 4-6).     anastrozole  (ARIMIDEX ) 1 MG tablet TAKE 1 TABLET(1 MG) BY MOUTH DAILY 90 tablet 1   aspirin EC 81 MG tablet Take 81 mg by mouth at bedtime. Swallow whole.     omeprazole  (PRILOSEC) 40 MG capsule Take 1 capsule (40 mg total) by mouth daily. 30 capsule 2   ondansetron  (ZOFRAN -ODT) 4 MG disintegrating tablet Take 4 mg by mouth every 8 (eight) hours as needed. (Patient not taking: Reported on 08/11/2024)     traMADol  (ULTRAM ) 50 MG tablet Take 1 tablet (50 mg total)  by mouth every 6 (six) hours as needed. (Patient not taking: Reported on 08/11/2024) 30 tablet 0   triamcinolone  ointment (KENALOG ) 0.5 % Apply 1 Application topically 2 (two) times daily. (Patient not taking: Reported on 08/11/2024) 30 g 0   varenicline  (CHANTIX ) 0.5 MG tablet Days 1  to 3- 0.5 mg once daily,Days 4 to 7-0.5 mg twice daily,Maintenance day 8 and later 1 mg twice daily- may consider a temporary or permanent dose reduction if usual dose is not tolerated (Patient not taking: Reported on 08/11/2024) 60 tablet 0   No current facility-administered medications for this visit.     PHYSICAL EXAMINATION:   Vitals:   08/11/24 0951  BP: 135/72  Pulse: 94  Resp: 16  Temp: 97.8 F (36.6 C)  SpO2: 100%       Filed Weights   08/11/24 0951  Weight: 81 lb 14.4 oz (37.1 kg)     Physical Exam Vitals and nursing note reviewed.  HENT:     Head: Normocephalic and atraumatic.     Mouth/Throat:     Pharynx: Oropharynx is clear.  Eyes:     Extraocular Movements: Extraocular movements intact.     Pupils: Pupils are equal, round, and reactive to light.  Cardiovascular:     Rate and Rhythm: Normal rate and regular rhythm.  Pulmonary:     Comments: Decreased breath sounds bilaterally.  Abdominal:     Palpations: Abdomen is soft.  Musculoskeletal:        General: Normal range of motion.     Cervical back: Normal range of motion.  Skin:    General: Skin is warm.  Neurological:     General: No focal deficit present.     Mental Status: She is alert and oriented to person, place, and time.  Psychiatric:        Behavior: Behavior normal.        Judgment: Judgment normal.      LABORATORY DATA:  I have reviewed the data as listed Lab Results  Component Value Date   WBC 4.7 08/11/2024   HGB 12.4 08/11/2024   HCT 36.4 08/11/2024   MCV 96.0 08/11/2024   PLT 321 08/11/2024   Recent Labs    06/17/24 1009 07/15/24 0837 08/11/24 0923  NA 139 139 139  K 4.1 3.9 3.8  CL 105 106 105  CO2 23 22 23   GLUCOSE 111* 101* 102*  BUN 12 12 12   CREATININE 0.59 0.54 0.56  CALCIUM 9.6 9.4 9.5  GFRNONAA >60 >60 >60  PROT 7.3 7.3 7.4  ALBUMIN 4.1 3.9 4.1  AST 21 20 25   ALT 9 12 13   ALKPHOS 77 84 86  BILITOT 0.3 0.3 0.3     RADIOGRAPHIC STUDIES: I have  personally reviewed the radiological images as listed and agreed with the findings in the report. No results found.   ASSESSMENT & PLAN:   Primary cancer of right lower lobe of lung (HCC) # AUG 2025-[ [facility]] Dr. Tamea. POSITIVE FOR MALIGNANCY- SQUAMOUS CELL CARCINOMA- CT RIGHT lower lobe, 6.4 cm greatest   PET scan:  Hypermetabolic right lower lobe mass and probable right hilar adenopathy, findings consistent with T2 be N1 M0 or stage II B primary bronchogenic carcinoma.  AUG 2025-  brain MRI-NEG. Treatment: s/p Definitive weekly carbotaxol with the daily radiation. [Start- 03/15/24- 04/29/2024]  # NOV 22nd, 2025- CT chest Reduce size and increased cavitation of the right lower lobe mass, currently measuring 3.7 x 2.5 cm and previously 4.8 x  3.3 cm on 02/13/24. No evidence of metastatic disease; Stable 3 mm nodules in the left upper lobe without change, recommend continued surveillance; await repeat CT scan in April 2026 [rad onc]  # Proceed with IMFINZI  #3 q 4 weeks- Labs-CBC/chemistries were reviewed with the patient.  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   # Bone health- recommend BMD; but declines- recommend calcium 1200 mg/day;OFF vit D- high dose vit D- -[June 2025]- HIGH will rechcek vit D levels.   # active smoker: < 1ppd; recommend quitting-   # IV ACCESS: s/p port placement.     PS-  # DISPOSITION: # IMFINZI  today-  # Follow up in  4 weeks with MD; labs/port- IMFINZI  -Dr.B     All questions were answered. The patient/family knows to call the clinic with any problems, questions or concerns.    Cindy JONELLE Joe, MD 08/11/2024 10:06 AM

## 2024-08-11 NOTE — Progress Notes (Signed)
 Pt in for follow up and treatment. Pt is very distraught, her grandson who lived with her unexpectedly passed away a few days ago. Pt also reports insurance would not cover chantix .

## 2024-08-11 NOTE — Patient Instructions (Signed)
 [facility] - A DEPT OF [facility]  Discharge Instructions: Thank you for choosing [facility] to provide your oncology and hematology care.  If you have a lab appointment with the Cancer Center, please go directly to the Cancer Center and check in at the registration area.  Wear comfortable clothing and clothing appropriate for easy access to any Portacath or PICC line.   We strive to give you quality time with your provider. You may need to reschedule your appointment if you arrive late (15 or more minutes).  Arriving late affects you and other patients whose appointments are after yours.  Also, if you miss three or more appointments without notifying the office, you may be dismissed from the clinic at the provider's discretion.      For prescription refill requests, have your pharmacy contact our office and allow 72 hours for refills to be completed.    Today you received the following chemotherapy and/or immunotherapy agents durvalumab      To help prevent nausea and vomiting after your treatment, we encourage you to take your nausea medication as directed.  BELOW ARE SYMPTOMS THAT SHOULD BE REPORTED IMMEDIATELY: *FEVER GREATER THAN 100.4 F (38 C) OR HIGHER *CHILLS OR SWEATING *NAUSEA AND VOMITING THAT IS NOT CONTROLLED WITH YOUR NAUSEA MEDICATION *UNUSUAL SHORTNESS OF BREATH *UNUSUAL BRUISING OR BLEEDING *URINARY PROBLEMS (pain or burning when urinating, or frequent urination) *BOWEL PROBLEMS (unusual diarrhea, constipation, pain near the anus) TENDERNESS IN MOUTH AND THROAT WITH OR WITHOUT PRESENCE OF ULCERS (sore throat, sores in mouth, or a toothache) UNUSUAL RASH, SWELLING OR PAIN  UNUSUAL VAGINAL DISCHARGE OR ITCHING   Items with * indicate a potential emergency and should be followed up as soon as possible or go to the Emergency Department if any problems should occur.  Please show the CHEMOTHERAPY ALERT CARD or IMMUNOTHERAPY  ALERT CARD at check-in to the Emergency Department and triage nurse.  Should you have questions after your visit or need to cancel or reschedule your appointment, please contact [facility] - A DEPT OF [facility]  [phone] and follow the prompts.  Office hours are 8:00 a.m. to 4:30 p.m. Monday - Friday. Please note that voicemails left after 4:00 p.m. may not be returned until the following business day.  We are closed weekends and major holidays. You have access to a nurse at all times for urgent questions. Please call the main number to the clinic [phone] and follow the prompts.  For any non-urgent questions, you may also contact your provider using MyChart. We now offer e-Visits for anyone 82 and older to request care online for non-urgent symptoms. For details visit [email or web address].   Also download the MyChart app! Go to the app store, search "MyChart", open the app, select [facility], and log in with your MyChart username and password.

## 2024-08-11 NOTE — Assessment & Plan Note (Addendum)
#   AUG 2025-[ [facility]] Dr. Tamea. POSITIVE FOR MALIGNANCY- SQUAMOUS CELL CARCINOMA- CT RIGHT lower lobe, 6.4 cm greatest   PET scan:  Hypermetabolic right lower lobe mass and probable right hilar adenopathy, findings consistent with T2 be N1 M0 or stage II B primary bronchogenic carcinoma.  AUG 2025-  brain MRI-NEG. Treatment: s/p Definitive weekly carbotaxol with the daily radiation. [Start- 03/15/24- 04/29/2024]  # NOV 22nd, 2025- CT chest Reduce size and increased cavitation of the right lower lobe mass, currently measuring 3.7 x 2.5 cm and previously 4.8 x 3.3 cm on 02/13/24. No evidence of metastatic disease; Stable 3 mm nodules in the left upper lobe without change, recommend continued surveillance; await repeat CT scan in April 2026 [rad onc]  # Proceed with IMFINZI  #3 q 4 weeks- Labs-CBC/chemistries were reviewed with the patient.  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   # Bone health- recommend BMD; but declines- recommend calcium 1200 mg/day;OFF vit D- high dose vit D- -[June 2025]- HIGH will rechcek vit D levels.   # active smoker: < 1ppd; recommend quitting-   # IV ACCESS: s/p port placement.     PS-  # DISPOSITION: # IMFINZI  today-  # Follow up in  4 weeks with MD; labs/port- IMFINZI  -Dr.B

## 2024-08-12 ENCOUNTER — Inpatient Hospital Stay: Admitting: Internal Medicine

## 2024-08-12 ENCOUNTER — Inpatient Hospital Stay

## 2024-08-12 LAB — T4: T4, Total: 8.5 ug/dL (ref 4.5–12.0)

## 2024-09-09 ENCOUNTER — Inpatient Hospital Stay: Admitting: Internal Medicine

## 2024-09-09 ENCOUNTER — Inpatient Hospital Stay

## 2024-10-07 ENCOUNTER — Ambulatory Visit

## 2024-10-21 ENCOUNTER — Ambulatory Visit: Admitting: Radiation Oncology
